# Patient Record
Sex: Female | Born: 1935
Health system: Southern US, Community
[De-identification: ages and names within clinical notes are randomized; demographics above are authoritative.]

## PROBLEM LIST (undated history)

## (undated) DIAGNOSIS — F32A Depression, unspecified: Secondary | ICD-10-CM

## (undated) DIAGNOSIS — I255 Ischemic cardiomyopathy: Secondary | ICD-10-CM

## (undated) DIAGNOSIS — I442 Atrioventricular block, complete: Secondary | ICD-10-CM

## (undated) DIAGNOSIS — E785 Hyperlipidemia, unspecified: Secondary | ICD-10-CM

## (undated) DIAGNOSIS — I2510 Atherosclerotic heart disease of native coronary artery without angina pectoris: Secondary | ICD-10-CM

## (undated) DIAGNOSIS — K219 Gastro-esophageal reflux disease without esophagitis: Secondary | ICD-10-CM

## (undated) DIAGNOSIS — D869 Sarcoidosis, unspecified: Secondary | ICD-10-CM

## (undated) DIAGNOSIS — E039 Hypothyroidism, unspecified: Secondary | ICD-10-CM

## (undated) DIAGNOSIS — F329 Major depressive disorder, single episode, unspecified: Secondary | ICD-10-CM

## (undated) DIAGNOSIS — H269 Unspecified cataract: Secondary | ICD-10-CM

## (undated) DIAGNOSIS — M1990 Unspecified osteoarthritis, unspecified site: Secondary | ICD-10-CM

## (undated) DIAGNOSIS — I5032 Chronic diastolic (congestive) heart failure: Secondary | ICD-10-CM

## (undated) HISTORY — DX: Depression, unspecified: F32.A

## (undated) HISTORY — DX: Hyperlipidemia, unspecified: E78.5

## (undated) HISTORY — PX: OTHER SURGICAL HISTORY: SHX169

## (undated) HISTORY — PX: GALLBLADDER SURGERY: SHX652

## (undated) HISTORY — PX: UPPER GASTROINTESTINAL ENDOSCOPY: SHX188

## (undated) HISTORY — DX: Major depressive disorder, single episode, unspecified: F32.9

## (undated) HISTORY — DX: Atherosclerotic heart disease of native coronary artery without angina pectoris: I25.10

## (undated) HISTORY — PX: LUMBAR LAMINECTOMY: SHX95

## (undated) HISTORY — DX: Unspecified cataract: H26.9

## (undated) HISTORY — DX: Sarcoidosis, unspecified: D86.9

## (undated) HISTORY — PX: COLONOSCOPY: SHX174

## (undated) HISTORY — DX: Gastro-esophageal reflux disease without esophagitis: K21.9

## (undated) HISTORY — PX: ANTERIOR CERVICAL DISCECTOMY: SHX1160

## (undated) HISTORY — DX: Atrioventricular block, complete: I44.2

## (undated) HISTORY — PX: PACEMAKER INSERTION: SHX728

## (undated) HISTORY — DX: Unspecified osteoarthritis, unspecified site: M19.90

## (undated) HISTORY — DX: Chronic diastolic (congestive) heart failure: I50.32

## (undated) HISTORY — DX: Hypothyroidism, unspecified: E03.9

---

## 1999-09-01 ENCOUNTER — Encounter: Payer: Self-pay | Admitting: Neurosurgery

## 1999-09-03 ENCOUNTER — Inpatient Hospital Stay (HOSPITAL_COMMUNITY): Admission: RE | Admit: 1999-09-03 | Discharge: 1999-09-09 | Payer: Self-pay | Admitting: Neurosurgery

## 1999-09-03 ENCOUNTER — Encounter: Payer: Self-pay | Admitting: Neurosurgery

## 1999-09-05 ENCOUNTER — Encounter: Payer: Self-pay | Admitting: Neurosurgery

## 1999-09-08 ENCOUNTER — Encounter: Payer: Self-pay | Admitting: *Deleted

## 1999-11-04 ENCOUNTER — Encounter: Admission: RE | Admit: 1999-11-04 | Discharge: 1999-11-04 | Payer: Self-pay | Admitting: Neurosurgery

## 1999-11-04 ENCOUNTER — Encounter: Payer: Self-pay | Admitting: Neurosurgery

## 2000-01-13 ENCOUNTER — Encounter: Payer: Self-pay | Admitting: Neurosurgery

## 2000-01-13 ENCOUNTER — Encounter: Admission: RE | Admit: 2000-01-13 | Discharge: 2000-01-13 | Payer: Self-pay | Admitting: Neurosurgery

## 2000-01-29 ENCOUNTER — Encounter: Admission: RE | Admit: 2000-01-29 | Discharge: 2000-01-29 | Payer: Self-pay | Admitting: Neurosurgery

## 2000-01-29 ENCOUNTER — Encounter: Payer: Self-pay | Admitting: Neurosurgery

## 2000-02-11 ENCOUNTER — Encounter: Payer: Self-pay | Admitting: Neurosurgery

## 2000-02-11 ENCOUNTER — Ambulatory Visit (HOSPITAL_COMMUNITY): Admission: RE | Admit: 2000-02-11 | Discharge: 2000-02-12 | Payer: Self-pay | Admitting: Neurosurgery

## 2000-02-29 ENCOUNTER — Ambulatory Visit (HOSPITAL_COMMUNITY): Admission: RE | Admit: 2000-02-29 | Discharge: 2000-02-29 | Payer: Self-pay | Admitting: Neurosurgery

## 2000-02-29 ENCOUNTER — Encounter: Payer: Self-pay | Admitting: Internal Medicine

## 2000-04-01 ENCOUNTER — Ambulatory Visit (HOSPITAL_COMMUNITY): Admission: RE | Admit: 2000-04-01 | Discharge: 2000-04-01 | Payer: Self-pay | Admitting: Neurosurgery

## 2000-04-01 ENCOUNTER — Encounter: Payer: Self-pay | Admitting: Internal Medicine

## 2000-04-26 ENCOUNTER — Ambulatory Visit (HOSPITAL_COMMUNITY): Admission: RE | Admit: 2000-04-26 | Discharge: 2000-04-26 | Payer: Self-pay | Admitting: Neurosurgery

## 2000-04-26 ENCOUNTER — Encounter: Payer: Self-pay | Admitting: Internal Medicine

## 2000-10-17 ENCOUNTER — Encounter: Payer: Self-pay | Admitting: Neurosurgery

## 2000-10-17 ENCOUNTER — Ambulatory Visit (HOSPITAL_COMMUNITY): Admission: RE | Admit: 2000-10-17 | Discharge: 2000-10-17 | Payer: Self-pay | Admitting: Neurosurgery

## 2000-12-07 ENCOUNTER — Encounter: Payer: Self-pay | Admitting: Neurosurgery

## 2000-12-09 ENCOUNTER — Encounter: Payer: Self-pay | Admitting: Neurosurgery

## 2000-12-09 ENCOUNTER — Inpatient Hospital Stay (HOSPITAL_COMMUNITY): Admission: RE | Admit: 2000-12-09 | Discharge: 2000-12-14 | Payer: Self-pay | Admitting: Neurosurgery

## 2003-03-27 ENCOUNTER — Ambulatory Visit (HOSPITAL_COMMUNITY): Admission: RE | Admit: 2003-03-27 | Discharge: 2003-03-27 | Payer: Self-pay | Admitting: Internal Medicine

## 2004-02-21 ENCOUNTER — Ambulatory Visit (HOSPITAL_COMMUNITY): Admission: RE | Admit: 2004-02-21 | Discharge: 2004-02-22 | Payer: Self-pay | Admitting: *Deleted

## 2004-11-11 ENCOUNTER — Ambulatory Visit: Payer: Self-pay | Admitting: Cardiology

## 2005-01-27 ENCOUNTER — Inpatient Hospital Stay (HOSPITAL_COMMUNITY): Admission: EM | Admit: 2005-01-27 | Discharge: 2005-01-28 | Payer: Self-pay | Admitting: Emergency Medicine

## 2005-01-27 ENCOUNTER — Ambulatory Visit: Payer: Self-pay | Admitting: Cardiology

## 2005-02-11 ENCOUNTER — Ambulatory Visit: Payer: Self-pay | Admitting: Cardiology

## 2005-02-16 ENCOUNTER — Ambulatory Visit: Payer: Self-pay | Admitting: Internal Medicine

## 2005-03-22 ENCOUNTER — Ambulatory Visit: Payer: Self-pay | Admitting: Internal Medicine

## 2005-04-16 ENCOUNTER — Ambulatory Visit: Payer: Self-pay | Admitting: Internal Medicine

## 2005-06-01 ENCOUNTER — Ambulatory Visit: Payer: Self-pay | Admitting: Internal Medicine

## 2005-07-13 ENCOUNTER — Ambulatory Visit: Payer: Self-pay | Admitting: Cardiology

## 2005-10-29 ENCOUNTER — Ambulatory Visit: Payer: Self-pay | Admitting: Cardiology

## 2005-11-05 ENCOUNTER — Ambulatory Visit: Payer: Self-pay | Admitting: *Deleted

## 2005-12-14 ENCOUNTER — Ambulatory Visit: Payer: Self-pay | Admitting: Internal Medicine

## 2006-01-28 ENCOUNTER — Ambulatory Visit: Payer: Self-pay | Admitting: Internal Medicine

## 2006-02-15 ENCOUNTER — Ambulatory Visit (HOSPITAL_COMMUNITY): Admission: RE | Admit: 2006-02-15 | Discharge: 2006-02-15 | Payer: Self-pay | Admitting: Internal Medicine

## 2006-03-04 ENCOUNTER — Ambulatory Visit: Payer: Self-pay | Admitting: Internal Medicine

## 2006-03-16 ENCOUNTER — Ambulatory Visit: Payer: Self-pay | Admitting: Internal Medicine

## 2006-03-16 ENCOUNTER — Ambulatory Visit (HOSPITAL_COMMUNITY): Admission: RE | Admit: 2006-03-16 | Discharge: 2006-03-16 | Payer: Self-pay | Admitting: Internal Medicine

## 2006-03-17 ENCOUNTER — Ambulatory Visit (HOSPITAL_COMMUNITY): Admission: RE | Admit: 2006-03-17 | Discharge: 2006-03-17 | Payer: Self-pay | Admitting: Internal Medicine

## 2006-04-19 ENCOUNTER — Ambulatory Visit: Payer: Self-pay | Admitting: Internal Medicine

## 2006-05-25 ENCOUNTER — Inpatient Hospital Stay (HOSPITAL_COMMUNITY): Admission: AD | Admit: 2006-05-25 | Discharge: 2006-05-27 | Payer: Self-pay | Admitting: Cardiology

## 2006-05-25 ENCOUNTER — Ambulatory Visit: Payer: Self-pay | Admitting: Internal Medicine

## 2006-06-27 ENCOUNTER — Ambulatory Visit: Payer: Self-pay | Admitting: Cardiology

## 2006-07-21 ENCOUNTER — Ambulatory Visit: Payer: Self-pay | Admitting: Internal Medicine

## 2006-08-23 ENCOUNTER — Ambulatory Visit: Payer: Self-pay | Admitting: Internal Medicine

## 2006-09-21 ENCOUNTER — Ambulatory Visit: Payer: Self-pay | Admitting: Internal Medicine

## 2006-10-19 ENCOUNTER — Ambulatory Visit: Payer: Self-pay | Admitting: Internal Medicine

## 2006-11-30 ENCOUNTER — Ambulatory Visit: Payer: Self-pay | Admitting: Internal Medicine

## 2007-01-11 ENCOUNTER — Ambulatory Visit: Payer: Self-pay | Admitting: Internal Medicine

## 2007-01-11 ENCOUNTER — Ambulatory Visit: Payer: Self-pay | Admitting: Cardiology

## 2007-01-12 ENCOUNTER — Ambulatory Visit: Payer: Self-pay | Admitting: Cardiology

## 2007-01-12 ENCOUNTER — Inpatient Hospital Stay (HOSPITAL_COMMUNITY): Admission: AD | Admit: 2007-01-12 | Discharge: 2007-01-14 | Payer: Self-pay | Admitting: Internal Medicine

## 2007-01-13 ENCOUNTER — Encounter: Payer: Self-pay | Admitting: Cardiology

## 2007-02-08 ENCOUNTER — Ambulatory Visit: Payer: Self-pay | Admitting: Internal Medicine

## 2007-03-12 ENCOUNTER — Ambulatory Visit: Payer: Self-pay | Admitting: Internal Medicine

## 2007-04-05 ENCOUNTER — Ambulatory Visit: Payer: Self-pay | Admitting: Internal Medicine

## 2007-05-03 ENCOUNTER — Ambulatory Visit: Payer: Self-pay | Admitting: Internal Medicine

## 2007-06-06 ENCOUNTER — Encounter: Payer: Self-pay | Admitting: Cardiology

## 2007-06-28 ENCOUNTER — Ambulatory Visit: Payer: Self-pay | Admitting: Internal Medicine

## 2007-07-14 ENCOUNTER — Ambulatory Visit: Payer: Self-pay | Admitting: Internal Medicine

## 2007-07-26 ENCOUNTER — Ambulatory Visit: Payer: Self-pay | Admitting: Internal Medicine

## 2007-08-09 ENCOUNTER — Ambulatory Visit: Payer: Self-pay | Admitting: Cardiology

## 2007-08-23 ENCOUNTER — Ambulatory Visit: Payer: Self-pay | Admitting: Internal Medicine

## 2007-08-24 ENCOUNTER — Ambulatory Visit: Payer: Self-pay | Admitting: Cardiology

## 2007-09-20 ENCOUNTER — Ambulatory Visit: Payer: Self-pay | Admitting: Internal Medicine

## 2007-11-15 ENCOUNTER — Ambulatory Visit: Payer: Self-pay | Admitting: Internal Medicine

## 2007-12-13 ENCOUNTER — Ambulatory Visit: Payer: Self-pay | Admitting: Internal Medicine

## 2007-12-19 ENCOUNTER — Ambulatory Visit: Payer: Self-pay | Admitting: Cardiology

## 2008-01-10 ENCOUNTER — Ambulatory Visit: Payer: Self-pay | Admitting: Internal Medicine

## 2008-01-26 ENCOUNTER — Ambulatory Visit: Payer: Self-pay | Admitting: Internal Medicine

## 2008-02-13 ENCOUNTER — Ambulatory Visit: Payer: Self-pay | Admitting: Cardiology

## 2008-02-19 ENCOUNTER — Encounter: Payer: Self-pay | Admitting: Cardiology

## 2008-03-15 ENCOUNTER — Ambulatory Visit: Payer: Self-pay

## 2008-03-21 ENCOUNTER — Ambulatory Visit: Payer: Self-pay | Admitting: Internal Medicine

## 2008-04-19 ENCOUNTER — Ambulatory Visit: Payer: Self-pay | Admitting: Internal Medicine

## 2008-05-28 ENCOUNTER — Ambulatory Visit: Payer: Self-pay | Admitting: Cardiology

## 2008-06-13 ENCOUNTER — Ambulatory Visit: Payer: Self-pay | Admitting: Internal Medicine

## 2008-06-19 ENCOUNTER — Encounter: Payer: Self-pay | Admitting: Internal Medicine

## 2008-06-25 ENCOUNTER — Ambulatory Visit: Payer: Self-pay | Admitting: Internal Medicine

## 2008-06-25 ENCOUNTER — Ambulatory Visit (HOSPITAL_COMMUNITY): Admission: RE | Admit: 2008-06-25 | Discharge: 2008-06-25 | Payer: Self-pay | Admitting: Internal Medicine

## 2008-07-12 ENCOUNTER — Ambulatory Visit: Payer: Self-pay | Admitting: Internal Medicine

## 2008-10-10 DIAGNOSIS — I2510 Atherosclerotic heart disease of native coronary artery without angina pectoris: Secondary | ICD-10-CM | POA: Insufficient documentation

## 2008-10-10 DIAGNOSIS — D869 Sarcoidosis, unspecified: Secondary | ICD-10-CM | POA: Insufficient documentation

## 2008-10-10 DIAGNOSIS — E785 Hyperlipidemia, unspecified: Secondary | ICD-10-CM | POA: Insufficient documentation

## 2008-10-10 DIAGNOSIS — I442 Atrioventricular block, complete: Secondary | ICD-10-CM | POA: Insufficient documentation

## 2008-10-10 DIAGNOSIS — I10 Essential (primary) hypertension: Secondary | ICD-10-CM | POA: Insufficient documentation

## 2008-10-23 ENCOUNTER — Encounter: Payer: Self-pay | Admitting: Cardiology

## 2008-12-12 ENCOUNTER — Ambulatory Visit: Payer: Self-pay | Admitting: Cardiology

## 2009-04-10 ENCOUNTER — Encounter: Payer: Self-pay | Admitting: Internal Medicine

## 2009-04-18 ENCOUNTER — Encounter: Payer: Self-pay | Admitting: Cardiology

## 2009-04-18 ENCOUNTER — Ambulatory Visit: Payer: Self-pay | Admitting: Internal Medicine

## 2009-06-18 ENCOUNTER — Ambulatory Visit: Payer: Self-pay | Admitting: Cardiology

## 2010-01-05 ENCOUNTER — Encounter: Payer: Self-pay | Admitting: Cardiology

## 2010-02-05 ENCOUNTER — Encounter: Payer: Self-pay | Admitting: Cardiology

## 2010-02-05 ENCOUNTER — Encounter: Payer: Self-pay | Admitting: Physician Assistant

## 2010-02-05 ENCOUNTER — Ambulatory Visit: Payer: Self-pay | Admitting: Cardiology

## 2010-02-06 ENCOUNTER — Encounter: Payer: Self-pay | Admitting: Cardiology

## 2010-03-02 ENCOUNTER — Ambulatory Visit: Payer: Self-pay | Admitting: Cardiology

## 2010-04-03 ENCOUNTER — Ambulatory Visit: Payer: Self-pay | Admitting: Internal Medicine

## 2010-05-05 ENCOUNTER — Ambulatory Visit (HOSPITAL_COMMUNITY): Admission: RE | Admit: 2010-05-05 | Discharge: 2010-05-05 | Payer: Self-pay | Admitting: Ophthalmology

## 2010-06-09 ENCOUNTER — Ambulatory Visit (HOSPITAL_COMMUNITY): Admission: RE | Admit: 2010-06-09 | Discharge: 2010-06-09 | Payer: Self-pay | Admitting: Ophthalmology

## 2010-09-04 ENCOUNTER — Encounter: Payer: Self-pay | Admitting: Cardiology

## 2010-10-27 ENCOUNTER — Ambulatory Visit: Payer: Self-pay | Admitting: Cardiology

## 2010-12-03 ENCOUNTER — Inpatient Hospital Stay (HOSPITAL_COMMUNITY)
Admission: EM | Admit: 2010-12-03 | Discharge: 2010-12-04 | Payer: Self-pay | Source: Home / Self Care | Attending: Internal Medicine | Admitting: Internal Medicine

## 2010-12-03 ENCOUNTER — Encounter: Payer: Self-pay | Admitting: Cardiology

## 2010-12-04 ENCOUNTER — Encounter: Payer: Self-pay | Admitting: Cardiology

## 2011-01-16 ENCOUNTER — Encounter: Payer: Self-pay | Admitting: Internal Medicine

## 2011-01-21 ENCOUNTER — Ambulatory Visit
Admission: RE | Admit: 2011-01-21 | Discharge: 2011-01-21 | Payer: Self-pay | Source: Home / Self Care | Attending: Cardiology | Admitting: Cardiology

## 2011-01-26 NOTE — Assessment & Plan Note (Signed)
SummaryChauncy Lean [facility] 2/11   Visit Type:  Follow-up Primary Provider:  Dr. Donzetta Sprung   History of Present Illness: 77 year old woman presents for a followup visit. She was seen in consultation during an admission to [facility] back in February with atypical chest pain. An echocardiogram was obtained after the patient ruled out for myocardial infarction, results detail below. She reports doing reasonably well since that time without any progressive chest pain.  Ms. Hungate asked today, "so what is wrong with my heart - nobody has ever told me?"  As I have explained to her on many occasions, I reviewed her history of obstructive coronary artery disease status post percutaneous intervention, as well as complete heart block status post pacemaker placement by Dr. Graciela Husbands. I also reviewed the importance of her medications and regular followup, and the fact that her most recent objective testing has suggested stability.  In reviewing the discharge summary from February, it is not clear that Ms. Joana Reamer has been taking either Norvasc or hydrochlorothiazide as indicated.  Current Medications (verified): 1)  Aspir-Low 81 Mg Tbec (Aspirin) .... Take 1 Tablet By Mouth Once A Day 2)  Simvastatin 40 Mg Tabs (Simvastatin) .... Take 1 Tablet By Mouth Once A Day 3)  Prevacid 30 Mg Cpdr (Lansoprazole) .... Take 1 Tablet By Mouth Once A Day 4)  Caltrate 600+d 600-400 Mg-Unit Tabs (Calcium Carbonate-Vitamin D) .... Take 1 Tablet By Mouth Once A Day 5)  Hydrochlorothiazide 25 Mg Tabs (Hydrochlorothiazide) .... Take 1 Tablet By Mouth Once A Day 6)  Synthroid 88 Mcg Tabs (Levothyroxine Sodium) .... Take 1 Tablet By Mouth Once A Day 7)  Zoloft 100 Mg Tabs (Sertraline Hcl) .... Take 1/2 Tablet By Mouth Once A Day  Allergies (verified): No Known Drug Allergies  Comments:  Nurse/Medical Assistant: Spoke with pt's pharmacy who states the last time Norvasc was filled was April of 2010 and HCTZ  was last filled in October of 2010.  Cyril Loosen, RN, BSN (March 02, 2010 10:54 AM)  Past History:  Past Medical History: Last updated: 02/27/2010 Complete Heart Block s/p Medtronic PPM Sarcoidosis Diastolic CHF CAD - DES LAD 2/05, normal LVEF Arthritis Depression Hyperlipidemia Hypothyroidism  Social History: Last updated: 02/27/2010 Tobacco Use - No Alcohol Use - no  Clinical Review Panels:  Echocardiogram Echocardiogram Normal left ventricular chamber size with mild LVH, and LVEF of 60%. Device wire present in right ventricle. Mild mitral regurgitation. Moderate aortic calcification with slight aortic stenosis. RVSP 39 mm mercury. (02/06/2010)  Cardiac Imaging Cardiac Cath Findings ANGIOGRAPHIC DATA:  1. The left main appeared to be free of critical disease.  2. The left anterior descending artery courses to the apex.  In the      mid LAD is a previously placed stent.  There is about 30% narrowing      at the takeoff of the first diagonal.  The stent itself appears to      be widely patent, there does not appear to be significant narrowing      of the stent.  It does overlap a small diagonal which may have some      very mild ostial pinching.  The remainder of the diagonals may have      mild luminal irregularity but no significant focal obstruction.  3. The circumflex is a large-caliber vessel.  There is a tiny first      marginal that has about 70% ostial narrowing; second, third and  fourth marginals are all free of critical disease.  4. The right coronary artery is a moderate-sized vessel.  There is      some calcification proximally.  It provides a single PDA and a      smaller posterolateral system both of which appear free of critical      disease. (01/13/2007)    Review of Systems  The patient denies anorexia, fever, weight loss, chest pain, syncope, peripheral edema, headaches, melena, and hematochezia.         Occasional indigestion, otherwise  reviewed and negative.  Vital Signs:  Patient profile:   77 year old female Height:      65 inches Weight:      166 pounds BMI:     27.72 Pulse rate:   71 / minute BP sitting:   144 / 82  (left arm) Cuff size:   regular  Vitals Entered By: Carlye Grippe (March 02, 2010 10:21 AM)  Nutrition Counseling: Patient's BMI is greater than 25 and therefore counseled on weight management options.   Physical Exam  Additional Exam:  Normally nourished appearing woman in no acute distress. HEENT: Conjunctiva and lids normal, oropharynx moist mucosa. Neck: Supple, no carotid bruits, no thyromegaly. Lungs: Clear to auscultation, nonlabored. Cardiac: Regular rate and rhythm. Thorax: Stable device pocket site. Extremities: No pitting edema.   PPM Specifications Following MD:  Sherryl Manges, MD     PPM Vendor:  [facility] [employer]     PPM Model Number:  [id number]     PPM Serial Number:  [id number] PPM DOI:  06/25/2008     PPM Implanting MD:  Sherryl Manges, MD  Lead 1    Location: RA     DOI: 09/04/1999     Model #: [id number]     Serial #: [id number]     Status: active Lead 2    Location: RV     DOI: 09/04/1999     Model #: [id number]     Serial #: [id number]     Status: active  Magnet Response Rate:  BOL 100 ERI 85  Indications:  CHB  Explantation Comments:  06/25/2008 Meridan [id number] explanted  PPM Follow Up Pacer Dependent:  Yes      Parameters Mode:  DDDR     Lower Rate Limit:  60     Upper Rate Limit:  130 Paced AV Delay:  150     Sensed AV Delay:  120  Impression & Recommendations:  Problem # 1:  CAD, NATIVE VESSEL (ICD-414.01)  Stable overall. Plan to continue medical therapy, and arrange followup visit in 6 months. Recent echocardiography reveals preserved left ventricular systolic function.  The following medications were removed from the medication list:    Norvasc 10 Mg Tabs (Amlodipine besylate) .Marland Kitchen... Take 1 tablet by mouth once a day Her updated medication list for this problem  includes:    Aspir-low 81 Mg Tbec (Aspirin) .Marland Kitchen... Take 1 tablet by mouth once a day    Amlodipine Besylate 10 Mg Tabs (Amlodipine besylate) .Marland Kitchen... Take one tablet by mouth daily  Problem # 2:  HYPERTENSION, BENIGN (ICD-401.1)  Will verify medication list. Based on recent discharge summary, patient should be on Norvasc and hydrochlorothiazide for blood pressure control.  The following medications were removed from the medication list:    Norvasc 10 Mg Tabs (Amlodipine besylate) .Marland Kitchen... Take 1 tablet by mouth once a day Her updated medication list for this problem includes:    Aspir-low 81 Mg Tbec (Aspirin) .Marland KitchenMarland KitchenMarland KitchenMarland Kitchen  Take 1 tablet by mouth once a day    Hydrochlorothiazide 12.5 Mg Tabs (Hydrochlorothiazide) .Marland Kitchen... Take one tablet by mouth daily.    Amlodipine Besylate 10 Mg Tabs (Amlodipine besylate) .Marland Kitchen... Take one tablet by mouth daily  Patient Instructions: 1)  Start taking Norvasc (amlodipine) 10mg  by mouth once daily. 2)  Start taking HCTZ (hydrochlorothiazide) 12.5mg  by mouth once daily. 3)  Your physician wants you to follow-up in: 6 months. You will receive a reminder letter in the mail one-two months in advance. If you don't receive a letter, please call our office to schedule the follow-up appointment. Prescriptions: AMLODIPINE BESYLATE 10 MG TABS (AMLODIPINE BESYLATE) Take one tablet by mouth daily  #30 x 6   Entered by:   Cyril Loosen, RN, BSN   Authorized by:   Loreli Slot, MD, [facility]   Signed by:   Cyril Loosen, RN, BSN on 03/02/2010   Method used:   Electronically to        [facility] [address] (retail)       [address]       [place], [place]  [address]       Ph: [phone] or [phone]       Fax: [phone]   RxID:   [id number] HYDROCHLOROTHIAZIDE 12.5 MG TABS (HYDROCHLOROTHIAZIDE) Take one tablet by mouth daily.  #30 x 6   Entered by:   Cyril Loosen, RN, BSN   Authorized by:   Loreli Slot, MD, [facility]    Signed by:   Cyril Loosen, RN, BSN on 03/02/2010   Method used:   Electronically to        [facility] [address] (retail)       [address]       [place], [place]  [address]       Ph: [phone] or [phone]       Fax: [phone]   RxID:   [id number]

## 2011-01-26 NOTE — Letter (Signed)
Summary: [facility] D/C  DR. TERY DANIEL  [facility] D/C  DR. TERY DANIEL   Imported By: Zachary George 03/02/2010 08:38:24  _____________________________________________________________________  External Attachment:    Type:   Image     Comment:   External Document

## 2011-01-26 NOTE — Letter (Signed)
Summary: External Correspondence/ NOTE DR. DANIEL  External Correspondence/ NOTE DR. DANIEL   Imported By: Dorise Hiss 01/08/2010 08:30:39  _____________________________________________________________________  External Attachment:    Type:   Image     Comment:   External Document

## 2011-01-26 NOTE — Consult Note (Signed)
Summary: CARDIOLOGY CONSULT/ [facility]  CARDIOLOGY CONSULT/ [facility]   Imported By: Zachary George 03/02/2010 08:37:19  _____________________________________________________________________  External Attachment:    Type:   Image     Comment:   External Document

## 2011-01-26 NOTE — Cardiovascular Report (Signed)
Summary: Office Visit   Office Visit   Imported By: Roderic Ovens 04/16/2010 14:01:24  _____________________________________________________________________  External Attachment:    Type:   Image     Comment:   External Document

## 2011-01-26 NOTE — Procedures (Signed)
Summary: PC2   Current Medications (verified): 1)  Aspir-Low 81 Mg Tbec (Aspirin) .... Take 1 Tablet By Mouth Once A Day 2)  Simvastatin 40 Mg Tabs (Simvastatin) .... Take 1 Tablet By Mouth Once A Day 3)  Prevacid 30 Mg Cpdr (Lansoprazole) .... Take 1 Tablet By Mouth Once A Day 4)  Caltrate 600+d 600-400 Mg-Unit Tabs (Calcium Carbonate-Vitamin D) .... Take 1 Tablet By Mouth Once A Day 5)  Hydrochlorothiazide 12.5 Mg Tabs (Hydrochlorothiazide) .... Take One Tablet By Mouth Daily. 6)  Synthroid 88 Mcg Tabs (Levothyroxine Sodium) .... Take 1 Tablet By Mouth Once A Day 7)  Zoloft 100 Mg Tabs (Sertraline Hcl) .... Take 1/2 Tablet By Mouth Once A Day 8)  Amlodipine Besylate 10 Mg Tabs (Amlodipine Besylate) .... Take One Tablet By Mouth Daily  Allergies (verified): No Known Drug Allergies   PPM Specifications Following MD:  Hillis Range, MD     PPM Vendor:  Medtronic     PPM Model Number:  [id number]     PPM Serial Number:  [id number] PPM DOI:  06/25/2008     PPM Implanting MD:  Sherryl Manges, MD  Lead 1    Location: RA     DOI: 09/04/1999     Model #: [id number]     Serial #: [id number]     Status: active Lead 2    Location: RV     DOI: 09/04/1999     Model #: [id number]     Serial #: [id number]     Status: active  Magnet Response Rate:  BOL 100 ERI 85  Indications:  CHB  Explantation Comments:  06/25/2008 Meridan [id number] explanted  PPM Follow Up Remote Check?  No Battery Voltage:  2.81 V     Battery Est. Longevity:  7.5 YEARS     Pacer Dependent:  Yes       PPM Device Measurements Atrium  Amplitude: 4 mV, Impedance: 452 ohms, Threshold: 0.625 V at 0.4 msec Right Ventricle  Amplitude: 11.2 mV, Threshold: 1.125 V at 0.4 msec  Episodes MS Episodes:  11     Percent Mode Switch:  <0.1%     Ventricular High Rate:  0     Atrial Pacing:  9.8%     Ventricular Pacing:  99.7%  Parameters Mode:  DDDR     Lower Rate Limit:  60     Upper Rate Limit:  130 Paced AV Delay:  150     Sensed AV Delay:  120 Next  Cardiology Appt Due:  09/26/2010 Tech Comments:  Normal device function.  Atrial sensitivity changed to unipolar today, bipolar P waves were 0.7, unipolar, 4.  All mode switch episodes less than 1 minute. No other changes made today.  ROV 6 months Dr Johney Frame. Gypsy Balsam RN BSN  April 03, 2010 10:11 AM

## 2011-01-26 NOTE — Assessment & Plan Note (Signed)
Summary: 6 mon ful fholt   Visit Type:  Follow-up Primary Provider:  Dr. Donzetta Sprung   History of Present Illness: 77 year old woman presents for followup. I saw her back in March. She was seen for device followup by Dr. Johney Frame in April, with normal device function.  She was seen in the morning at ED back in September with weakness, chest pain, dizziness, diarrhea. Lab work showed a BUN 21, creatinine 1.7, sodium 137, potassium 2.6, TSH 0.61, CK 69, troponin I 0.02, d-dimer 1.2, that he sees 8.7, 1115.9, platelets 229. Ventilation perfusion lung scan was very low probability for pulmonary embolus. Potassium was supplemented and she received IV fluids. She declined admission. These symptoms resolved - she thinks that she had "a virus."  No chest pain or unusual dyspnea. No palpitations.  Clinical Review Panels:  Echocardiogram Echocardiogram Normal left ventricular chamber size with mild LVH, and LVEF of 60%. Device wire present in right ventricle. Mild mitral regurgitation. Moderate aortic calcification with slight aortic stenosis. RVSP 39 mm mercury. (02/06/2010)  Cardiac Imaging Cardiac Cath Findings ANGIOGRAPHIC DATA:  1. The left main appeared to be free of critical disease.  2. The left anterior descending artery courses to the apex.  In the      mid LAD is a previously placed stent.  There is about 30% narrowing      at the takeoff of the first diagonal.  The stent itself appears to      be widely patent, there does not appear to be significant narrowing      of the stent.  It does overlap a small diagonal which may have some      very mild ostial pinching.  The remainder of the diagonals may have      mild luminal irregularity but no significant focal obstruction.  3. The circumflex is a large-caliber vessel.  There is a tiny first      marginal that has about 70% ostial narrowing; second, third and      fourth marginals are all free of critical disease.  4. The right coronary  artery is a moderate-sized vessel.  There is      some calcification proximally.  It provides a single PDA and a      smaller posterolateral system both of which appear free of critical      disease. (01/13/2007)    Preventive Screening-Counseling & Management  Alcohol-Tobacco     Smoking Status: never  Current Medications (verified): 1)  Aspir-Low 81 Mg Tbec (Aspirin) .... Take 1 Tablet By Mouth Once A Day 2)  Simvastatin 40 Mg Tabs (Simvastatin) .... Take 1 Tablet By Mouth Once A Day 3)  Prevacid 30 Mg Cpdr (Lansoprazole) .... Take 1 Tablet By Mouth Once A Day 4)  Caltrate 600+d 600-400 Mg-Unit Tabs (Calcium Carbonate-Vitamin D) .... Take 1 Tablet By Mouth Once A Day 5)  Hydrochlorothiazide 12.5 Mg Tabs (Hydrochlorothiazide) .... Take One Tablet By Mouth Daily. 6)  Synthroid 88 Mcg Tabs (Levothyroxine Sodium) .... Take 1 Tablet By Mouth Once A Day 7)  Zoloft 100 Mg Tabs (Sertraline Hcl) .... Take 1/2 Tablet By Mouth Once A Day 8)  Amlodipine Besylate 10 Mg Tabs (Amlodipine Besylate) .... Take One Tablet By Mouth Daily 9)  Bromday 0.09 % Soln (Bromfenac Sodium) .... One Drop Ou Two Times A Day 10)  Durezol 0.05 % Emul (Difluprednate) .... One Drop Ou Two Times A Day 11)  Vigamox 0.5 % Soln (Moxifloxacin Hcl) .... One Drop [employer]  Two Times A Day  Allergies (verified): No Known Drug Allergies  Comments:  Nurse/Medical Assistant: The patient's medication list and allergies were reviewed with the patient and were updated in the Medication and Allergy Lists.  Past History:  Past Medical History: Last updated: 02/27/2010 Complete Heart Block s/p Medtronic PPM Sarcoidosis Diastolic CHF CAD - DES LAD 2/05, normal LVEF Arthritis Depression Hyperlipidemia Hypothyroidism  Social History: Last updated: 02/27/2010 Tobacco Use - No Alcohol Use - no  Review of Systems  The patient denies anorexia, fever, weight loss, chest pain, syncope, dyspnea on exertion, peripheral edema,  prolonged cough, hemoptysis, melena, and hematochezia.         Otherwise reviewed and negative.  Vital Signs:  Patient profile:   77 year old female Height:      65 inches Weight:      167 pounds O2 Sat:      95 % on Room air Pulse rate:   69 / minute BP sitting:   123 / 77  (left arm) Cuff size:   large  Vitals Entered By: Carlye Grippe (October 27, 2010 3:18 PM)  O2 Flow:  Room air  Physical Exam  Additional Exam:  Normally nourished appearing woman in no acute distress. HEENT: Conjunctiva and lids normal, oropharynx moist mucosa. Neck: Supple, no carotid bruits, no thyromegaly. Lungs: Clear to auscultation, nonlabored. Cardiac: Regular rate and rhythm. Thorax: Stable device pocket site. Extremities: No pitting edema.   PPM Specifications Following MD:  Hillis Range, MD     PPM Vendor:  Medtronic     PPM Model Number:  [id number]     PPM Serial Number:  [id number] PPM DOI:  06/25/2008     PPM Implanting MD:  Sherryl Manges, MD  Lead 1    Location: RA     DOI: 09/04/1999     Model #: [id number]     Serial #: [id number]     Status: active Lead 2    Location: RV     DOI: 09/04/1999     Model #: [id number]     Serial #: [id number]     Status: active  Magnet Response Rate:  BOL 100 ERI 85  Indications:  CHB  Explantation Comments:  06/25/2008 Meridan [id number]/[id number] explanted  PPM Follow Up Pacer Dependent:  Yes      Parameters Mode:  DDDR     Lower Rate Limit:  60     Upper Rate Limit:  130 Paced AV Delay:  150     Sensed AV Delay:  120  Impression & Recommendations:  Problem # 1:  CAD, NATIVE VESSEL (ICD-414.01)  Symptomatically stable without significant angina on medical therapy. No changes made today. Follow-up in 6 months.  Her updated medication list for this problem includes:    Aspir-low 81 Mg Tbec (Aspirin) .Marland Kitchen... Take 1 tablet by mouth once a day    Amlodipine Besylate 10 Mg Tabs (Amlodipine besylate) .Marland Kitchen... Take one tablet by mouth daily  Problem # 2:  AV BLOCK, COMPLETE  (ICD-426.0)  Status post pacemaker, followed by Dr. Johney Frame.  Her updated medication list for this problem includes:    Aspir-low 81 Mg Tbec (Aspirin) .Marland Kitchen... Take 1 tablet by mouth once a day    Amlodipine Besylate 10 Mg Tabs (Amlodipine besylate) .Marland Kitchen... Take one tablet by mouth daily  Patient Instructions: 1)  Your physician recommends that you continue on your current medications as directed. Please refer to the Current Medication list given to you today. 2)  Follow up in  6 months

## 2011-01-28 NOTE — Assessment & Plan Note (Signed)
Summary: EPH-6 [facility] POST [facility]   Visit Type:  Follow-up Primary Provider:  Dr. Donzetta Sprung   History of Present Illness: 77 year old woman presents for followup. She was seen back in November 2011. Record review finds that she was admitted to [facility] in December 2011 with chest pain. She ruled out for myocardial infarction and underwent a Myoview study, reviewed below, without ischemia. Medication adjustments were made.  She presents today stating that she has not had any further chest pain. She reports some memory problems and occasional confusion, discussing this with Dr. Reuel Boom. She has stopped her Crestor completely, complaining of leg discomfort on the medication, subsequently resolved. Medical regimen is reviewed below.  Blood pressure looks good today. We discussed a basic walking regimen. She continues to have device followup with Dr. Johney Frame.  Preventive Screening-Counseling & Management  Alcohol-Tobacco     Smoking Status: never  Current Medications (verified): 1)  Aspir-Low 81 Mg Tbec (Aspirin) .... Take 1 Tablet By Mouth Once A Day 2)  Prevacid 30 Mg Cpdr (Lansoprazole) .... Take 1 Tablet By Mouth Once A Day 3)  Caltrate 600+d 600-400 Mg-Unit Tabs (Calcium Carbonate-Vitamin D) .... Take 1 Tablet By Mouth Once A Day 4)  Hydrochlorothiazide 12.5 Mg Tabs (Hydrochlorothiazide) .... Take One Tablet By Mouth Daily. 5)  Synthroid 88 Mcg Tabs (Levothyroxine Sodium) .... Take 1 Tablet By Mouth Once A Day 6)  Zoloft 100 Mg Tabs (Sertraline Hcl) .... Take 1/2 Tablet By Mouth Once A Day 7)  Amlodipine Besylate 10 Mg Tabs (Amlodipine Besylate) .... Take One Tablet By Mouth Daily As Needed 8)  Fish Oil 1000 Mg Caps (Omega-3 Fatty Acids) .... Take 1 Tablet By Mouth Once A Day 9)  Flax Seed Oil 1000 Mg Caps (Flaxseed (Linseed)) .... Take 1 Tablet By Mouth Once A Day 10)  Refresh 1.4-0.6 % Soln (Polyvinyl Alcohol-Povidone) .... As Needed  Allergies (verified): No Known Drug  Allergies  Comments:  Nurse/Medical Assistant: The patient's medication bottles and allergies were reviewed with the patient and were updated in the Medication and Allergy Lists.  Past History:  Past Medical History: Last updated: 02/27/2010 Complete Heart Block s/p Medtronic PPM Sarcoidosis Diastolic CHF CAD - DES LAD 2/05, normal LVEF Arthritis Depression Hyperlipidemia Hypothyroidism  Social History: Last updated: 02/27/2010 Tobacco Use - No Alcohol Use - no  Review of Systems       The patient complains of dyspnea on exertion.  The patient denies anorexia, fever, weight gain, chest pain, syncope, peripheral edema, hemoptysis, melena, and hematochezia.         Otherwise reviewed and negative.  Vital Signs:  Patient profile:   77 year old female Height:      65 inches Weight:      163 pounds BMI:     27.22 Pulse rate:   76 / minute BP sitting:   124 / 73  (left arm) Cuff size:   large  Vitals Entered By: Carlye Grippe (January 21, 2011 9:56 AM)  Nutrition Counseling: Patient's BMI is greater than 25 and therefore counseled on weight management options.  Physical Exam  Additional Exam:  Normally nourished appearing woman in no acute distress. HEENT: Conjunctiva and lids normal, oropharynx moist mucosa. Neck: Supple, no carotid bruits, no thyromegaly. Lungs: Clear to auscultation, nonlabored. Cardiac: Regular rate and rhythm. Thorax: Stable device pocket site. Extremities: No pitting edema. Skin: Warm and dry. Musculoskeletal: No kyphosis. Neuropsychiatric: Alert and oriented x3, affect grossly appropriate.   Nuclear Study  Procedure date:  12/04/2010  Findings:      Lexiscan Myoview demonstrating no perfusion evidence of ischemia with diaphragmatic attenuation, normal wall motion, and LVEF 82%.  PPM Specifications Following MD:  Hillis Range, MD     PPM Vendor:  Medtronic     PPM Model Number:  [id number]     PPM Serial Number:  [id number] PPM DOI:   06/25/2008     PPM Implanting MD:  Sherryl Manges, MD  Lead 1    Location: RA     DOI: 09/04/1999     Model #: [id number]     Serial #: [id number]     Status: active Lead 2    Location: RV     DOI: 09/04/1999     Model #: [id number]     Serial #: [id number]     Status: active  Magnet Response Rate:  BOL 100 ERI 85  Indications:  CHB  Explantation Comments:  06/25/2008 Meridan [id number] explanted  PPM Follow Up Pacer Dependent:  Yes      Parameters Mode:  DDDR     Lower Rate Limit:  60     Upper Rate Limit:  130 Paced AV Delay:  150     Sensed AV Delay:  120  Impression & Recommendations:  Problem # 1:  CAD, NATIVE VESSEL (ICD-414.01)  Symptomatically stable on medical therapy. Recent Myoview in December 2011 showed no evidence of ischemia. No changes made today. Followup in 6 months, sooner if needed.  Her updated medication list for this problem includes:    Aspir-low 81 Mg Tbec (Aspirin) .Marland Kitchen... Take 1 tablet by mouth once a day    Amlodipine Besylate 10 Mg Tabs (Amlodipine besylate) .Marland Kitchen... Take one tablet by mouth daily as needed  Problem # 2:  HYPERLIPIDEMIA-MIXED (ICD-272.4)  Patient was switched from simvastatin to Crestor back in December given concurrent use of Norvasc. She has however discontinued Crestor completely complaining of leg discomfort. She feels better now. She prefers to stay on omega-3 supplements and flaxseed oil at this point. We can readdress statin therapy along the way with a followup lipid profile.  The following medications were removed from the medication list:    Simvastatin 40 Mg Tabs (Simvastatin) .Marland Kitchen... Take 1 tablet by mouth once a day  Problem # 3:  HYPERTENSION, BENIGN (ICD-401.1)  Blood pressure well-controlled today.  Her updated medication list for this problem includes:    Aspir-low 81 Mg Tbec (Aspirin) .Marland Kitchen... Take 1 tablet by mouth once a day    Hydrochlorothiazide 12.5 Mg Tabs (Hydrochlorothiazide) .Marland Kitchen... Take one tablet by mouth daily.    Amlodipine  Besylate 10 Mg Tabs (Amlodipine besylate) .Marland Kitchen... Take one tablet by mouth daily as needed  Problem # 4:  AV BLOCK, COMPLETE (ICD-426.0)  Continue pacemaker followup with Dr. Johney Frame.  Her updated medication list for this problem includes:    Aspir-low 81 Mg Tbec (Aspirin) .Marland Kitchen... Take 1 tablet by mouth once a day    Amlodipine Besylate 10 Mg Tabs (Amlodipine besylate) .Marland Kitchen... Take one tablet by mouth daily as needed  Patient Instructions: 1)  Your physician wants you to follow-up in: 6 months. You will receive a reminder letter in the mail one-two months in advance. If you don't receive a letter, please call our office to schedule the follow-up appointment. 2)  Your physician recommends that you continue on your current medications as directed. Please refer to the Current Medication list given to you today. 3)  Your physician recommends that you go to the  [facility] for a FASTING lipid profile and liver function labs:  DO IN 6 MONTHS BEFORE OFFICE VISIT.

## 2011-01-28 NOTE — Cardiovascular Report (Signed)
Summary: Cardiac Catheterization  Cardiac Catheterization   Imported By: Dorise Hiss 01/21/2011 08:24:31  _____________________________________________________________________  External Attachment:    Type:   Image     Comment:   External Document

## 2011-02-15 ENCOUNTER — Encounter (INDEPENDENT_AMBULATORY_CARE_PROVIDER_SITE_OTHER): Payer: Medicare Other | Admitting: Internal Medicine

## 2011-02-15 ENCOUNTER — Encounter: Payer: Self-pay | Admitting: Internal Medicine

## 2011-02-15 DIAGNOSIS — I442 Atrioventricular block, complete: Secondary | ICD-10-CM

## 2011-02-23 NOTE — Cardiovascular Report (Signed)
Summary: Card Device Clinic/ FINAL REPORT  Card Device Clinic/ FINAL REPORT   Imported By: Dorise Hiss 02/16/2011 16:07:08  _____________________________________________________________________  External Attachment:    Type:   Image     Comment:   External Document

## 2011-02-23 NOTE — Assessment & Plan Note (Signed)
Summary: PER AMBER OVER DUE/NR   Primary Provider:  Dr. Donzetta Sprung   History of Present Illness: The patient presents today for routine electrophysiology followup. She reports doing reasonably well since last being seen in our clinic. She has occasional sharp fleeting chest pains which are stable.  She has chronic but stable dyspnea which she attributes to sarcoidosis. The patient denies symptoms of palpitations,  lower extremity edema, dizziness, presyncope, syncope, or neurologic sequela. The patient is tolerating medications without difficulties and is otherwise without complaint today.   Allergies: No Known Drug Allergies  Past History:  Past Medical History: Reviewed history from 02/27/2010 and no changes required. Complete Heart Block s/p Medtronic PPM Sarcoidosis Diastolic CHF CAD - DES LAD 2/05, normal LVEF Arthritis Depression Hyperlipidemia Hypothyroidism  Past Surgical History: Anterior cervical diskectomy with cervical fusion Lumbar laminectomy, L3-S1 S/P PPM for complete heart block  Social History: Reviewed history from 02/27/2010 and no changes required. Tobacco Use - No Alcohol Use - no  Vital Signs:  Patient profile:   77 year old female Height:      65 inches Weight:      163 pounds BMI:     27.22 Pulse rate:   85 / minute Resp:     16 per minute BP sitting:   126 / 70  (right arm)  Vitals Entered By: Marrion Coy, CNA (February 15, 2011 12:17 PM)  Physical Exam  General:  Well developed, well nourished, in no acute distress. Head:  normocephalic and atraumatic Eyes:  PERRLA/EOM intact; conjunctiva and lids normal. Mouth:  Teeth, gums and palate normal. Oral mucosa normal. Neck:  supple Chest Wall:  pacemaker pocket is well healed Lungs:  Clear bilaterally to auscultation and percussion. Heart:  RRR, no m/r/g Abdomen:  Bowel sounds positive; abdomen soft and non-tender without masses, organomegaly, or hernias noted. No  hepatosplenomegaly. Msk:  Back normal, normal gait. Muscle strength and tone normal. Extremities:  No clubbing or cyanosis. Neurologic:  Alert and oriented x 3. Skin:  Intact without lesions or rashes. Psych:  Normal affect.   PPM Specifications Following MD:  Hillis Range, MD     PPM Vendor:  Medtronic     PPM Model Number:  [id number]     PPM Serial Number:  [id number] PPM DOI:  06/25/2008     PPM Implanting MD:  Sherryl Manges, MD  Lead 1    Location: RA     DOI: 09/04/1999     Model #: [id number]     Serial #: [id number]     Status: active Lead 2    Location: RV     DOI: 09/04/1999     Model #: [id number]     Serial #: [id number]     Status: active  Magnet Response Rate:  BOL 100 ERI 85  Indications:  CHB  Explantation Comments:  06/25/2008 Meridan [id number]/[id number] explanted  PPM Follow Up Pacer Dependent:  Yes      Parameters Mode:  DDDR     Lower Rate Limit:  60     Upper Rate Limit:  130 Paced AV Delay:  150     Sensed AV Delay:  120 MD Comments:  see scanned report  Impression & Recommendations:  Problem # 1:  AV BLOCK, COMPLETE (ICD-426.0) normal pacemaker function see scanned report no changes  Problem # 2:  HYPERTENSION, BENIGN (ICD-401.1) at goal no changes  Patient Instructions: 1)  return to device clinic in 6 months

## 2011-03-09 LAB — COMPREHENSIVE METABOLIC PANEL
BUN: 14 mg/dL (ref 6–23)
CO2: 26 mEq/L (ref 19–32)
Calcium: 9.2 mg/dL (ref 8.4–10.5)
Chloride: 106 mEq/L (ref 96–112)
Creatinine, Ser: 1.55 mg/dL — ABNORMAL HIGH (ref 0.4–1.2)
GFR calc non Af Amer: 32 mL/min — ABNORMAL LOW (ref >60)
Glucose, Bld: 113 mg/dL — ABNORMAL HIGH (ref 70–99)
Total Bilirubin: 0.5 mg/dL (ref 0.3–1.2)

## 2011-03-09 LAB — DIFFERENTIAL
Basophils Absolute: 0 10*3/uL (ref 0.0–0.1)
Eosinophils Relative: 2 % (ref 0–5)
Lymphocytes Relative: 11 % — ABNORMAL LOW (ref 12–46)
Lymphs Abs: 0.8 10*3/uL (ref 0.7–4.0)
Neutrophils Relative %: 81 % — ABNORMAL HIGH (ref 43–77)

## 2011-03-09 LAB — LIPID PANEL
HDL: 41 mg/dL (ref >39)
Total CHOL/HDL Ratio: 5.3 RATIO
Triglycerides: 200 mg/dL — ABNORMAL HIGH (ref <150)
VLDL: 40 mg/dL (ref 0–40)

## 2011-03-09 LAB — CBC
HCT: 39.8 % (ref 36.0–46.0)
Hemoglobin: 13.5 g/dL (ref 12.0–15.0)
MCH: 30.5 pg (ref 26.0–34.0)
MCHC: 33.9 g/dL (ref 30.0–36.0)
MCV: 89.8 fL (ref 78.0–100.0)
RBC: 4.43 MIL/uL (ref 3.87–5.11)

## 2011-03-09 LAB — URINALYSIS, ROUTINE W REFLEX MICROSCOPIC
Glucose, UA: NEGATIVE mg/dL
Ketones, ur: 15 mg/dL — AB
Nitrite: NEGATIVE
Specific Gravity, Urine: 1.025 (ref 1.005–1.030)
pH: 6 (ref 5.0–8.0)

## 2011-03-09 LAB — URINE MICROSCOPIC-ADD ON

## 2011-03-09 LAB — POCT I-STAT, CHEM 8
BUN: 16 mg/dL (ref 6–23)
Calcium, Ion: 1.05 mmol/L — ABNORMAL LOW (ref 1.12–1.32)
Chloride: 107 mEq/L (ref 96–112)
Creatinine, Ser: 1.7 mg/dL — ABNORMAL HIGH (ref 0.4–1.2)
Glucose, Bld: 113 mg/dL — ABNORMAL HIGH (ref 70–99)
Potassium: 4.1 mEq/L (ref 3.5–5.1)

## 2011-03-09 LAB — CK TOTAL AND CKMB (NOT AT ARMC)
CK, MB: 1.9 ng/mL (ref 0.3–4.0)
Relative Index: INVALID (ref 0.0–2.5)
Total CK: 44 U/L (ref 7–177)

## 2011-03-09 LAB — TSH: TSH: 0.667 u[IU]/mL (ref 0.350–4.500)

## 2011-03-09 LAB — TROPONIN I: Troponin I: 0.02 ng/mL (ref 0.00–0.06)

## 2011-03-09 LAB — POCT CARDIAC MARKERS: Troponin i, poc: <0.05 ng/mL (ref 0.00–0.09)

## 2011-03-09 LAB — CARDIAC PANEL(CRET KIN+CKTOT+MB+TROPI)
CK, MB: 1.7 ng/mL (ref 0.3–4.0)
Total CK: 33 U/L (ref 7–177)

## 2011-03-16 LAB — BASIC METABOLIC PANEL
BUN: 21 mg/dL (ref 6–23)
Chloride: 105 mEq/L (ref 96–112)
Glucose, Bld: 113 mg/dL — ABNORMAL HIGH (ref 70–99)
Potassium: 3.8 mEq/L (ref 3.5–5.1)

## 2011-03-16 LAB — HEMOGLOBIN AND HEMATOCRIT, BLOOD: Hemoglobin: 14.1 g/dL (ref 12.0–15.0)

## 2011-05-11 NOTE — Assessment & Plan Note (Signed)
[facility]                          [facility] NOTE   Cynthia [name], Cynthia [name]                       MRN:          [id number]  DATE:05/28/2008                            DOB:          02-Sep-1931    PRIMARY CARE PHYSICIAN:  Donzetta Sprung, M.D.   PULMONOLOGIST:  Cherie Ouch, M.D.   REASON FOR VISIT:  Cardiac follow up.   HISTORY OF PRESENT ILLNESS:  Cynthia [name] was seen back in February.  She  did follow up with Dr. Orson Aloe, and I got his consultation note from  March.  She reports improvement in her shortness of breath with use of  p.r.n. albuterol inhalers.  Sometimes she reports wheezing in the  mornings and has a chronic cough.  This has been nonproductive.  She has  a general sense of tightness in her chest with this.  Otherwise, she is  not describing any frank anginal chest pain.  Her electrocardiogram  shows a stable paced ventricular rhythm.  We reviewed her medications  today.  We also talked about perhaps doing some pool exercises.   ALLERGIES:  NO KNOWN DRUG ALLERGIES.   MEDICATIONS:  1. Zoloft 50 mg p.o. daily.  2. Prevacid 30 mg p.o. daily.  3. Enteric-coated aspirin 325 mg p.o. daily.  4. Multivitamin daily.  5. Calcium supplements.  6. Amlodipine 5 mg p.o. daily.  7. Levothyroxine 88 mcg p.o. daily.  8. Sublingual nitroglycerin p.r.n.   REVIEW OF SYSTEMS:  As described in the present illness.   PHYSICAL EXAMINATION:  VITAL SIGNS:  Blood pressure is 141/82, heart  rate is 83, weight is 166 pounds.  Oxygen saturation is 96% on room air.  GENERAL:  The patient is comfortable and in no acute distress.  NECK:  No elevated jugular venous pressure or loud bruits.  LUNGS:  Diminished breath sounds.  No wheezing today.  CARDIAC:  Regular rate and rhythm.  No S3 gallop or murmur.  EXTREMITIES:  No pitting edema.   IMPRESSION/RECOMMENDATIONS:  Dyspnea on exertion with underlying chronic  lung disease with apparent  bronchospastic component given improvement on  albuterol.  This is being followed by Dr. Orson Aloe.  In addition, I  suspect she has diastolic dysfunction and therefore better blood  pressure control might be beneficial.  We will increase her amlodipine  to 10 mg daily.  She had reassuring coronary arteries at catheterization  last January  demonstrating a patent stent within the left anterior descending and  otherwise nonobstructive atherosclerosis.  We will plan to see her back  over the next 6 months.     [name], MD  Electronically Signed    SGM/MedQ  DD: 05/28/2008  DT: 05/28/2008  Job #: [id number]   cc:   Gaynell Face, M.D.

## 2011-05-11 NOTE — Assessment & Plan Note (Signed)
[facility]                         ELECTROPHYSIOLOGY OFFICE NOTE   Cynthia [name]                       MRN:          [id number]  DATE:07/14/2007                            DOB:          1931-11-15    Cynthia [name] comes in today.  She is status post pacemaker implantation  for complete heart block.  She is device dependent.  She has a history  of coronary artery disease with LAD stenting.  She has recent complaints  of worsening chest pain and exertional dyspnea.   About 2-1/2 years ago, she had problems with chest pain and underwent  catheterization at that time demonstrating patent stents.  She has  recently undergone eye surgery x2 and is having a significant amount of  pain in her left eye, vision which has been lost (see below).   CURRENT MEDICATIONS:  Synthroid, lisinopril, Zoloft, Prevacid, aspirin,  Toprol 25 and vitamins.   PHYSICAL EXAMINATION:  VITAL SIGNS:  Blood pressure 147/82, pulse 68.  LUNGS:  Clear.  HEART:  Heart sounds were regular.  EXTREMITIES:  Without edema.   Interrogation of her Guidant Meridian pulse generator demonstrates a P  wave of 1.2 with impedance of 430, threshold of 0.7 to 0.4, R wave is  13.6 with impedance of 840, threshold at 0.9 to 0.4.  She is approaching  ERI, but is not there yet.   IMPRESSION:  1. Complete heart block, status post pacemaker now approaching      elective replacement indicator (ERI).  2. Ischemic heart disease.      a.     Status post previously implanted left anterior descending       stent.      b.     Recent increase in exertional chest discomfort and shortness       of breath.  3. Previous episodes of noncardiac chest pain.  4. Recent eye surgery with ongoing pain.   PLAN:  Cynthia [name] is reluctant to proceed with further cardiac  evaluation at this time given the discomfort in her eye.  She is advised  that if she has increasing symptoms, she should follow up with [place]  sooner, but I have taken the liberty of setting her up to see Dr.  Diona Browner, whom she saw about 2 years ago, in about 4 weeks or so.  She  said that hopefully things would settle down and she would be willing to  pursue further cardiac evaluation in the form of stress testing or  echocardiography if Dr. Diona Browner felt that this was appropriate.  We  will continue her monitoring monthly and will see her again in 1 year's  time.    Duke Salvia, MD, [facility]  Electronically Signed   SCK/MedQ  DD: 07/14/2007  DT: 07/15/2007  Job #: [id number]

## 2011-05-11 NOTE — Assessment & Plan Note (Signed)
[facility]                          [facility]   Cynthia [name], Cynthia [name]                       MRN:          [id number]  DATE:02/13/2008                            DOB:          1931/03/11    PRIMARY CARE PHYSICIAN:  Dr. Donzetta Sprung.   REASON FOR VISIT:  Routine cardiology follow-up.   HISTORY OF PRESENT ILLNESS:  I last saw Cynthia [name] back in August 2008.  I referred her at that time for follow-up pulmonary testing as well as  an echocardiogram with a plan to see Dr. Orson Aloe.  She unfortunately  missed this follow-up visit. It has been rescheduled.  She continues to  report dyspnea on exertion at NYHA class II to III at times but no  progressive chest pain.  She had a pacemaker follow-up check with Dr.  Graciela Husbands back in January.  Her electrocardiogram shows a paced ventricular  rhythm without magnet and a dual-chamber paced rhythm with magnet.   In review of her follow-up studies, she had a CT scan of the chest in  August demonstrating stable appearing air space disease with partially  calcified lymph nodes.  This was described as being most consistent with  scarring of the lung parenchyma with no new abnormalities.  She has an  apparent history of sarcoidosis.  She had pulmonary function tests done  as well which showed an FEV-1 of 95% predicted and an FVC is 92%  predicted.  Echocardiography showed continued normal left ventricular  systolic function at 55-60% with left ventricular hypertrophy, no  segmental wall motion abnormalities and mild tricuspid regurgitation  without clear evidence of pulmonary hypertension.  There was no  pericardial effusion noted.   In reviewing her medications, I note that she is now off of both  lisinopril and Toprol XL.  She states that this was discontinued by Dr.  Neita Carp following a presentation with dizziness and dehydration.  She  was seen in consultation by Dr. Myrtis Ser at that time.   ALLERGIES:   No known drug allergies.   CURRENT MEDICATIONS:  1. Synthroid 0.075 mg p.o. daily.  2. Zoloft 50 mg p.o. daily.  3. Prevacid 30 mg p.o. daily.  4. Enteric coated aspirin 325 mg p.o. daily.  5. Multivitamin 1 p.o. daily.  6. Calcium supplements.  7. Sublingual nitroglycerin 0.4 mg p.r.n.   REVIEW OF SYSTEMS:  As described in the history of present illness. No  syncope.  No significant lower extremity edema.  No orthopnea or PND.  Otherwise negative.   PHYSICAL EXAMINATION:  Blood pressure is 160/89, heart rate is 77,  weight is 168 pounds which is stable in comparison to my previous note,  actually down 1 pound.  She is in no acute distress.  HEENT:  Conjunctiva is normal.  Pharynx clear.  NECK:  Supple.  No elevated jugular venous pressure and no loud bruits.  LUNGS:  Clear without labored breathing.  CARDIAC:  Regular rate and rhythm. Soft S3, soft systolic murmur.  No  pericardial rub.  ABDOMEN:  Soft, nontender.  EXTREMITIES:  Exhibit trace edema, nonpitting, distal  pulses 1 to 2+.  SKIN:  Warm and dry.  MUSCULOSKELETAL:  No kyphosis noted.  NEUROPSYCHIATRIC:  Patient alert and oriented x3.  Affect is normal.   IMPRESSION/RECOMMENDATIONS:  Dyspnea on exertion, likely multifactorial.  The patient has preserved left ventricular systolic function and no  clear evidence of pulmonary hypertension based on echocardiography from  August 2008. She does have underlying ischemic heart disease but no  typical anginal chest pain and had a cardiac catheterization last year  demonstrating patent stent within the left anterior descending with  otherwise nonobstructive disease in the remaining major epicardial  vessels.  She is hypertensive and is off of her prior antihypertensive  regimen.  It may well be that she has an element of diastolic  dysfunction.  It seems that her pulmonary status is overall stable based  on CT scan and pulmonary function testing, although I still would prefer   she see Dr. Orson Aloe for a formal pulmonary consultation. The patient  is aware that this has been rescheduled for March and we discussed this  again today. She reports a prior history of sarcoidosis.  Today we plan  to initiate Norvasc 5 mg daily and I will plan to see her back over the  next 3 months.     [name], MD  Electronically Signed    SGM/MedQ  DD: 02/13/2008  DT: 02/14/2008  Job #: [id number]   cc:   Consuela Mimes, M.D.  Duke Salvia, MD, [facility]

## 2011-05-11 NOTE — Cardiovascular Report (Signed)
[facility]                   [facility]IC NOTE   GENA, LASKI                       MRN:          [id number]  DATE:06/13/2008                            DOB:          Dec 31, 1930    HISTORY OF PRESENT ILLNESS:  Ms. Beining is seen following pacemaker  implantation in the year 2000, for complete heart block.  She has  reached ERI.  Over the last couple of months, she has felt  really  really worn out.  This may in fact predate her reaching the ERI.   MEDICATIONS:  1. Amlodipine 5.  2. Zoloft 50.  3. Prevacid.  4. Aspirin 325.  5. Levothyroxine 88.   ALLERGIES:  She has no known drug allergies.   PHYSICAL EXAMINATION:  VITAL SIGNS:  Her blood pressure is 142/87 and  her pulse is 84.  LUNGS:  Clear.  HEART:  Sounds were regular with an early systolic murmur.  ABDOMEN:  Soft.  NECK:  Veins were flat.  EXTREMITIES:  No edema.   Interrogation of her Guidant device demonstrates that she reached ERI a  month ago.  However, she has normal heart rate excursion and her pulse  is 84 suggesting, that chronotropic incompetence is not and the  reversion to DDD has no impact on her symptoms.   I should note that her atrial impedance was 430 with a threshold 0.6 at  0.4.  The RV impedance was 840 with threshold 0.7 at 0.4.   IMPRESSION:  1. Complete heart block.  2. Status post pacer for the above.  3. Relative deterioration in functional capacity without evidence of      chronotropic incompetence.   Ms. Payment needs to have her pulse generator changed.  I am, as  suggested above, not saying whether this will help her symptoms.  However, given her device dependence I think it is important that we  proceed with device implantation regardless of what the underlying  mechanism turns out to be and we will need to explore other explanations  for her exercise intolerance following device generator replacement if  in fact it  does not abate.   I have reviewed with her the potential benefits as well as the potential  risks including, but not limited to infection of the leads, and  malfunction.  She understands these risks and is willing to proceed.     Duke Salvia, MD, [facility]  Electronically Signed    SCK/MedQ  DD: 06/13/2008  DT: 06/14/2008  Job #: [id number]

## 2011-05-11 NOTE — Letter (Signed)
April 18, 2009    Donzetta Sprung  [address], [address]  [place], [place] [address]   RE:  Cynthia [name], Cynthia [name]  MRN:  [id number]  /  DOB:  [birth year]   Dear Aurther Loft,   Mrs. Lindo is seen in follow-up for complete heart block and she is  status post pacemaker generator replacement about a year ago.  At that  time she had presented following reversion and was quite symptomatic.  I  was not optimistic that all of her symptoms would resolve.  I thankfully  was wrong and she actually did beautifully [facility].   However, 2 days ago on her birthday while exercising she became very  sick and has had some persistent nausea since then.  She has been weak  and while her symptoms are improving she is still not able to eat well.  There has been no accompanying diarrhea or fever.   She does have some peripheral edema.   Her medications are notable for amlodipine 10 with intercurrent up  titration and maybe it is related to the edema, maybe it is not;  levothyroxine 88; simvastatin 40; aspirin; Prevacid and Zoloft 50.   On examination her blood pressure today was 142/83 with a pulse of 88,  her weight was 164 and stable.  There is no acute distress and she is  alert and oriented.  Her lungs were clear.  The heart sounds were  regular.  Pacemaker pocket was well healed.  The abdomen was soft with  active bowel sounds.  Extremities had trace edema.   Interrogation of her Medtronic Versapulse generator demonstrates a P-  wave of 1 with impedance of 432, a threshold 0.75 at 0.4.  There is no  intrinsic ventricular rhythm.  The impedance was 93 and threshold of 1 V  at 0.4.  Battery voltage is 2.81.  She is 100% ventricularly paced and  electrocardiogram confirms P synchronous pacing.   IMPRESSION:  1. Complete heart block.  2. Status post pacer for the above.  3. Hypertension - adequately controlled.  4. Edema, question related to amlodipine  being used for #3.    Mrs. Budhu, Aurther Loft  is doing well from arrhythmia point-of-view.  Her  device is functioning normally and I was very pleasantly surprised to  have been wrong about how much better and she would get following device  generator replacement after having reverted to a VVI pacing mode last  spring.   The episode that happened the other day hopefully is an acute infectious  illness.  It appears to be getting better and it may have been  aggravated acutely by her vigorous exercise.  I have asked her if it  does not resolve in the next couple of days to follow up with you.   The other issues the edema and whether it is related to amlodipine.  Her  blood pressures are high, but not so high, so I have set asked her to  take a 2-week trial of discontinuing her amlodipine and seeing if her  edema resolves.  In the event that it does, alternative antihypertensive  therapy could be considered.   Thanks very much for allowing [place] to see her.  We will see her again in 1  year's time.  She will follow up with Dr. Diona Browner the interim.    Sincerely,      Duke Salvia, MD, [facility]  Electronically Signed    SCK/MedQ  DD: 04/18/2009  DT: 04/18/2009  Job #: [id number]

## 2011-05-11 NOTE — Assessment & Plan Note (Signed)
[facility]                          [facility]   Cynthia [name], Cynthia [name]                       MRN:          [id number]  DATE:06/18/2009                            DOB:          07/10/1931    PRIMARY CARDIOLOGIST:  [name], MD   PRIMARY ELECTROPHYSIOLOGIST:  Duke Salvia, MD, [facility]   REASON FOR VISIT:  Scheduled followup.   [name] reports recent development of exertional dyspnea with  associated chest pressure.  She cannot recall if this is similar to her  presentation before undergoing stenting in February 2005.  Her last  catheterization in January 2008, by Dr. Riley Kill, indicated a widely  patent stent in the LAD.  Residual anatomy notable only for a 70%  ostial, tiny first obtuse marginal branch.  She was treated medically.   The patient also refers to recent development of palpitations, although  these do not appear to be very symptomatic.  They are also very brief in  duration.   [name] indicates that she has to climb 18 steps to get to her  apartment, with no associated chest pressure, but with some significant  shortness of breath.  However, she does report chest pressure when  performing household duties, which is relieved with rest.  She has not  used any p.r.n. nitroglycerin.   CURRENT MEDICATIONS:  1. Aspirin 325 daily.  2. Simvastatin 40 nightly.  3. Levothyroxine 0.088 daily.  4. Prevacid 30 daily.  5. Zoloft 50 daily.   REVIEW OF SYSTEMS:  The patient reports resolution of her pedal edema,  since Dr. Graciela Husbands stopped her amlodipine.  She denies any PND or  orthopnea.  All other systems reviewed, and are negative.   PHYSICAL EXAMINATION:  VITAL SIGNS:  Blood pressure 145/91, pulse 82 and  regular, weight 164.8.  GENERAL:  A 77 year old female sitting upright, no distress.  HEENT:  Normocephalic, atraumatic.  NECK:  Palpable carotid pulse without bruits; no JVD.  LUNGS:  Clear to  auscultation IN all fields.  HEART:  Regular rate and rhythm.  No significant murmurs.  No rubs.  ABDOMEN:  Soft, nontender.  EXTREMITIES:  No edema.  NEUROLOGIC:  No focal deficit.   IMPRESSION:  1. Exertional dyspnea/chest discomfort.  2. Single-vessel CAD.      a.     Status post drug-eluting stenting of the mid LAD, February       2005.  3. Normal LVF.  4. Complete heart block.      a.     Status post Medtronic pacemaker.      b.     Pacer dependent, by recent interrogation.  5. Hypertension.  6. Lower extremity edema.      a.     Resolved, off amlodipine.  7. Dyslipidemia.  8. Hypothyroidism.   PLAN:  In light of her symptoms, we have recommended the following:  A 2-  D echocardiogram for reassessment of left ventricular function, and rule  out of underlying structural abnormalities, and a pharmacologic  perfusion imaging study for assessment of ischemic burden.  Of note, the  patient has had several cardiac catheterizations since her percutaneous  intervention in December 2005, most recently in January 2008.  She,  however, refuses to schedule any of these tests at this time.  We have  encouraged her to follow-up with [place] in the office for symptom review  over the next month, and seek more urgent medical attention if her  symptoms worsen.      Rozell Searing, PA-C  Electronically Signed      [name], MD  Electronically Signed   GS/MedQ  DD: 06/18/2009  DT: 06/19/2009  Job #: [id number]   cc:   Donzetta Sprung

## 2011-05-11 NOTE — Op Note (Signed)
NAME:  Cynthia [name], Cynthia [name]                ACCOUNT NO.:  [id number]   MEDICAL RECORD NO.:  [id number]          PATIENT TYPE:  OIB   LOCATION:  2899                         FACILITY:  [facility]   PHYSICIAN:  Duke Salvia, MD, FACCDATE OF BIRTH:  02-11-1931   DATE OF PROCEDURE:  06/25/2008  DATE OF DISCHARGE:  06/25/2008                               OPERATIVE REPORT   PREOPERATIVE DIAGNOSIS:  Previously implanted pacemaker now at end-of-  life.   POSTOPERATIVE DIAGNOSIS:  Previously implanted pacemaker now at end-of-  life.   PROCEDURE:  Explantation of a previously implanted generator,  implantation of a new generator with pocket revision.   Following obtaining informed consent, the patient was brought to the  Electrophysiology Laboratory and placed on the fluoroscopic table in a  supine position.  After routine prep and drape, lidocaine was  infiltrated along the line of the previous incision.  It was carried  down to the device pocket using sharp dissection and electrocautery.  The pocket was opened with care.  The device was freed up and explanted.  The previously implanted ventricular lead was a [id number] lead, serial  #[id number] was made by [employer] and the R-wave was 11.7 with a pace  impedance of 802, a threshold of 0.8 volts at 0.5 milliseconds.  The  current threshold was 1.2 mA.   Previously implanted atrial lead was a [employer] [id number], serial [id number].  P-wave was 3 with a pace impedance of 391, a threshold of 0.5 volts at  0.5 milliseconds.  Current threshold was 1.5 mA.  These leads were then  attached to a Medtronic Versa VEDR01 pulse generator, serial  #[id number].  The pocket was copiously irrigated with antibiotic-  containing saline solution.  The floor of the pocket was excised in its  majority.  The lead of the pulse generator was then placed back into the  pocket and secured to the prepectoral fascia.  The wound was washed,  dried, and benzoin and Steri-Strips dressing was  applied.  Needle  counts, sponge counts, and instrument counts were correct at the end of  the procedure according to the staff.  The patient tolerated the  procedure without apparent complication.      Duke Salvia, MD, [facility]  Electronically Signed     SCK/MEDQ  D:  09/05/2008  T:  09/05/2008  Job:  [id number]

## 2011-05-11 NOTE — Assessment & Plan Note (Signed)
[facility]                          [facility] NOTE   Cynthia [name], Cynthia [name]                       MRN:          [id number]  DATE:08/09/2007                            DOB:          1931-10-03    PRIMARY CARE PHYSICIAN:  Dr. Donzetta Sprung.   ELECTROPHYSIOLOGIST:  Duke Salvia, M.D., [facility].   REASON FOR VISIT:  Re-establish cardiology followup.   HISTORY OF PRESENT ILLNESS:  I last saw Cynthia [name] back in the clinic  back in November of 2006.  She has a history of coronary artery disease  status post drug-eluting stent placement to the left anterior descending  in February of 2005, as well as a possible history of sarcoidosis based  on limited information.  Additional problems include previously  documented high-degree heart block status post pacemaker placement.  She  was actually just recently seen in July by Dr. Graciela Husbands for a device clinic  visit.  Her device was interrogated and functioning appropriately.  She  was noted to be approaching ERI, but was not yet close enough for  specific intervention.  She mentioned to Dr. Graciela Husbands a history of chest  pain.  Was referred back to the office to discuss this further.   In reviewing her interval history, I note that she was admitted to [facility] actually in January of this year, and underwent a repeat  cardiac catheterization at that time by Dr. Riley Kill.  She was noted to  have a patent stent within the left anterior descending with perhaps  only 30% stenosis in the left anterior descending near the take-off of  the first diagonal.  The circumflex had no significant stenosis.  There  was noted to be a tiny first obtuse marginal that had a 70% osteal  stenosis.  The right coronary artery had no significant disease either.  She had a ventilation perfusion lung scan during that hospital stay,  which was low probability for pulmonary embolus.  It was noted that she  had had a previous  CT scan of the chest in June of 2007 with abnormal  findings including a moderate amount of hilar and mediastinal adenopathy  with calcification suggesting either sarcoidosis or perhaps prior  infection.  She had a partially calcified right middle lobe nodule, felt  to be potentially a granuloma, and less likely a malignancy due to the  amount of calcification.  Patchy parenchymal lung scarring was noted  bilaterally as well.  A followup CT scan was suggested in approximately  6 months.  I spoke with Cynthia [name] today and reviewed her evaluation  from January.  She reports feeling tired and weak with activity,  sometimes also around bedtime.  She has occasional chest discomfort  without any significant change in pattern, and not necessarily always  associated with exertion.  She does report having some interval eye  surgery on the left eye at a facility in [place], and also at [facility].  I have no details in this regard.   Today, we talked about her lung findings by CT scan  and her prior  reported history of sarcoidosis.  She does not report seeing a  pulmonologist on any regular basis and not for many, many years since  seeing one in [place].  She sounds as if she may have undergone a lung  biopsy in years past.   ALLERGIES:  No known drug allergies.   PRESENT MEDICATIONS:  1. Synthroid 0.075 mg p.o. daily.  2. Lisinopril 10 mg p.o. daily.  3. Zoloft 50 mg p.o. daily.  4. Trazodone 25 mg p.o. nightly.  5. Prevacid 30 mg p.o. daily.  6. Enteric-coated aspirin 325 mg p.o. daily.  7. Toprol XL 25 mg p.o. daily.  8. Multivitamin daily.  9. Sublingual nitroglycerin p.r.n.   REVIEW OF SYSTEMS:  As described in the history of present illness.  She  has had no hemoptysis.  No palpitations or syncope.   EXAMINATION:  Blood pressure is 148/86, heart rate is 63, weight is 169  pounds, oxygen saturation is 93% on room air.  The patient is comfortable and in no  acute distress.  Denying any active  chest pain.  HEENT:  Conjunctivae looks normal.  Oropharynx is clear.  NECK:  Supple.  No elevated jugular venous pressure or loud bruits.  No  thyromegaly is noted.  LUNGS:  Somewhat coarse breath sounds, particularly at the right base.  No egophony.  No wheezing noted.  CARDIAC:  Regular rate and rhythm.  Soft systolic murmur.  No S3 gallop  or pericardial rub.  ABDOMEN:  Soft.  Normoactive bowel sounds.  EXTREMITIES:  No significant pitting edema.  SKIN:  Warm and dry.  MUSCULOSKELETAL:  No kyphosis is noted.  NEURO/PSYCH:  The patient is alert and oriented x3.   A 12-lead electrocardiogram today shows a paced ventricular rhythm with  atrial sensing and a heart rate of 64 beats per minute.   IMPRESSION/RECOMMENDATIONS:  1. Symptoms of fatigue and weakness as well as occasional chest pain      and shortness of breath.  Coronary angiography in January of this      year demonstrated stent patency within the left anterior descending      and no other major obstructive disease in the major epicardial      vessels.  She had a ventilation perfusion lung scan at that time      that was a low probability for a pulmonary embolus.  Previous CT      scan of the chest at [facility] in June of 2007 did      demonstrate abnormal findings as outlined above, and given the      patient's reported history of sarcoidosis, I feel that this clearly      needs further investigation.  I discussed this with the patient      today and planned to refer her to see Dr. Cherie Ouch here in      [place] for a pulmonary consultation.  In advance of this visit, we      will plan a non-contrasted CT scan of the chest, ambulatory oxygen      saturation, full pulmonary function tests, and a 2D echocardiogram      to assess both left and right ventricular function, as well as      pulmonary artery pressure.  She was in agreement with this and we      will proceed  forward as planned.  2. Otherwise, from a purely cardiac perspective, we will plan to see  her back for symptom review in the next 6 months.     [name], MD  Electronically Signed    SGM/MedQ  DD: 08/09/2007  DT: 08/10/2007  Job #: [id number]   cc:   Veatrice Kells, MD, [facility]  Cherie Ouch

## 2011-05-11 NOTE — Assessment & Plan Note (Signed)
[facility]                          [facility] NOTE   Cynthia [name], Cynthia [name]                       MRN:          [id number]  DATE:12/12/2008                            DOB:          12-May-1931    PRIMARY CARE PHYSICIAN:  Dr. Donzetta Sprung.   REASON FOR VISIT:  Routine followup.   Cynthia [name] is doing very well since her last visit.  She states that  her breathing has improved significantly.  She is actually interested in  joining the [facility] for regular exercise regimen, and we talked about this  some today.  She seems to be tolerating simvastatin at 40 mg p.o. daily  and reports that she had improved cholesterol numbers when she saw Dr.  Reuel Boom back in followup recently.  Her LDL was up to 253 back in  February 2009.  She does have a prior problem with statin intolerance,  but seems to be doing okay at this point.  Otherwise she is not  reporting any angina.   ALLERGIES:  No known drug allergies.   MEDICATIONS:  1. Zoloft 50 mg p.o. daily.  2. Prevacid 30 mg p.o. daily.  3. Aspirin 325 mg p.o. daily.  4. Multivitamin 1 p.o. daily.  5. Calcium with vitamin D supplements.  6. Amlodipine 5 mg p.o. daily.  7. Levothyroxine 88 mcg p.o. daily.  8. Simvastatin 40 mg p.o. nightly.  9. Sublingual nitroglycerin 0.4 mg p.r.n.   REVIEW OF SYSTEMS:  As described in the history of present illness.  Otherwise, negative.   PHYSICAL EXAMINATION:  Blood pressure is 141/83, heart rate is 771,  weight is 168 pounds.  The patient is comfortable and in no acute  distress.  Examination of the neck reveals no elevated jugular venous  pressure.  No loud bruits.  Lungs are clear without labored breathing at  rest.  Cardiac exam reveals a regular rate and rhythm.  No S3 gallop or  pathologic systolic murmur.  Extremities exhibit no pitting edema.   IMPRESSION AND RECOMMENDATIONS:  1. Coronary artery disease status post stent placement to the left  anterior descending, patent angiography in January 2008.  At this      point, we will plan to continue medical therapy.  I did encourage      her to begin a regular exercise regimen, likely focusing on walking      or use of a stationary bicycle.  We will plan to see her back in 6      months.  2. Hyperlipidemia with significant elevated LDL cholesterol.  The      patient is tolerating simvastatin 40 mg daily at least over the      last 3 months, and reports that her lipid numbers      have come down significantly and follow up with Dr. Reuel Boom.  Try to      aim for LDL control as close to 70 as possible depending on her      tolerance for medical therapy.     [name], MD  Electronically Signed    SGM/MedQ  DD: 12/12/2008  DT: 12/12/2008  Job #: [id number]   cc:   Donzetta Sprung

## 2011-05-14 NOTE — Cardiovascular Report (Signed)
NAME:  Cynthia [name], Cynthia [name]                          ACCOUNT NO.:  [id number]   MEDICAL RECORD NO.:  [id number]                   PATIENT TYPE:  OIB   LOCATION:  2866                                 FACILITY:  [facility]   PHYSICIAN:  Carole Binning, M.D. [facility]         DATE OF BIRTH:  Oct 12, 1931   DATE OF PROCEDURE:  12/26/2004  DATE OF DISCHARGE:                              CARDIAC CATHETERIZATION   PROCEDURE:  1. Left heart catheterization with coronary angiography and left     ventriculography.  2. Percutaneous transluminal coronary angioplasty with placement of a drug-     eluting stent in the mid-left anterior descending artery.   INDICATIONS:  Cynthia [name] is a 77 year old woman with history of previous  pacemaker placement. She presented to [facility] with symptoms of  recurrent chest pain occurring at rest.  She ruled out for myocardial  infarction. Subsequent adenosine Cardiolite scan revealed apical ischemia.  She was therefore referred for cardiac catheterization.   PROCEDURE NOTE:  A 6-French sheath was placed in the right femoral artery.  Coronary angiography was performed with a 6-French JL-4 and JR-4 catheters.  Left ventriculography was performed with a angeled pigtail catheter.  Contrast was Omnipaque. There were no complications.   CATHETERIZATION RESULTS:  Hemodynamics:  Left ventricular pressure 140/10,  aortic pressure 140/64. There was no aortic valve gradient.   LEFT VENTRICULOGRAM:  Wall motion is normal. Ejection fraction estimated at  60%. There is 1+ mild mitral regurgitation.   CORONARY ANGIOGRAPHY (CODOMINANT):  Left main is normal.   Left anterior descending artery has diffuse luminal irregularities in the  proximal to mid-vessel. In the distal portion of the mid-LAD, there is a  discrete 80% stenosis occurring on a fairly sharp band within the artery.  Beyond this, the LAD is a fairly large vessel curling around the apex. The  LAD gives rise to 4  small diagonal branches.   Left circumflex is a large, codominant vessel. Circumflex gives rise to 3  small obtuse marginal branches and a large posterolateral branch. The left  circumflex is normal.   Right coronary artery has a 25% stenosis in the proximal vessel. The right  coronary artery is otherwise normal giving rise to a normal sized posterior  descending artery and 2 small posterolateral branches.   IMPRESSIONS:  1. Normal left ventricular systolic function.  2. One vessel coronary artery disease characterized by an 80% stenosis in     the distal portion of the mid-left anterior descending. This does     correspond with the area of ischemia seen on the Cardiolite scan.   PLAN:  Percutaneous intervention of the left anterior descending artery. See  below.   PERCUTANEOUS TRANSLUMINAL CORONARY ANGIOPLASTY PROCEDURAL NOTE:  Following  completion of the diagnostic catheterization which showed percutaneous  coronary intervention to be utilized.  We utilized the preexisting 6-French  sheath in the right femoral artery. Heparin and Integrilin were administered  per protocol.  We used a 6.5 JL 3.5 Guidant catheter. Then IQ coronary guide  wire was advanced under fluoroscopic guidance into the distal LAD. We then  performed PTCA with a 2.5 x 12 mm Quantum balloon positioned across the  lesion and inflated to 10 atmospheres. We then positioned a 2.5 x 16 mm  Taxus  drug-eluding stent across the stenosis in the mid-LAD and deployed the stent  at 11 atmospheres.  We then went back in with a 2.5 x 12 mm Quantum balloon  and inflated this balloon to 22 atmospheres in the proximal aspect of the  stent and 16 atmospheres in the distal aspect of the stent.  Finally, we  went back with a 2.75 x 12 mm Quantum balloon and inflated it to 22  atmospheres in the proximal aspect of the stent.   Final angiographic images were obtained revealing patency of the LAD with  10% residual stenosis at the  stent site and  TIMI-III flow into the distal vessel.   COMPLICATIONS:  None.   RESULTS:  Successful PTCA with placement of a drug-eluding stent in the mid  left anterior descending artery, an 80% stenosis was reduced to 10% residual  with TIMI-III flow.   PLAN:  Integrilin will be continued for 18 hours.  It is recommended the  patient be treated with Plavix for a minimum of 6 months, along with life-  long aspirin care.                                               Carole Binning, M.D. [facility]    MWP/MEDQ  D:  02/21/2004  T:  02/23/2004  Job:  [id number]   cc:   Donzetta Sprung  [address], [address]  [place]  [place] [address]  Fax: [phone]   [facility], [facility]   Cath Lab

## 2011-05-14 NOTE — Op Note (Signed)
NAME:  Slabach, Latreece S                          ACCOUNT NO.:  [id number]   MEDICAL RECORD NO.:  [id number]                   PATIENT TYPE:  AMB   LOCATION:  DAY                                  FACILITY:  [facility]   PHYSICIAN:  R. Roetta Sessions, M.D.              DATE OF BIRTH:  03-11-1931   DATE OF PROCEDURE:  03/27/2003  DATE OF DISCHARGE:                                 OPERATIVE REPORT   PROCEDURE:  EGD with Elease Hashimoto dilation.   INDICATIONS FOR PROCEDURE:  The patient is a 77 year old lady with recurrent  esophageal dysphagia.  She responded nicely to esophageal dilation  previously per report.  An EGD is now being done to further evaluate her  dysphagia.  She has intermittent postprandial abdominal cramps and diarrhea.  I gave her some NuLev through the office.  This was associated with marked  improvement in those symptoms.  She continues to take Prevacid 30 mg orally  daily for gastroesophageal reflux disease.  This approach has been discussed  with the patient previously.  The potential risks, benefits, and  alternatives have been reviewed and questions answered.  She is agreeable.  Please see my documented history and physical and the consultation of  03/20/03.   PROCEDURE NOTE:  O2 saturation, blood pressure, pulse, and respirations were  monitored throughout the entire procedure.   CONSCIOUS SEDATION:  Versed 2.5 mg IV, Demerol 50 mg IV in divided doses.   INSTRUMENT:  Olympus video chip adult gastroscope.   FINDINGS:  Examination of the tubular esophagus revealed no mucosal  abnormality.  The EG junction was easily traversed.   STOMACH:  The gastric cavity was emptied and insufflated well with air.  Thorough examination of the gastric mucosa including retroflexed view of the  proximal stomach and esophagogastric junction demonstrated only a small  hiatal hernia.  The pylorus was patent and easily traversed.   DUODENUM:  The bulb and second portion appeared normal.   THERAPEUTIC/DIAGNOSTIC MANEUVERS:  A 56 French Maloney dilator was passed to  full insertion with ease pulling back without apparent complication related  to patient's dilator.  The patient tolerated the procedure well and was  reactive in endoscopy.   IMPRESSION:  1. Normal esophagus.  2. Small hiatal hernia.  3. Otherwise normal stomach.  4. Normal D1 and D2.    RECOMMENDATIONS:  1. Continue Prevacid 30 mg orally daily for GERD.  2. Continue NuLev AC and HS PRN abdominal cramps and diarrhea.  3. Appointment to see me back in the office in three months.                                               Jonathon Bellows, M.D.    RMR/MEDQ  D:  03/27/2003  T:  03/27/2003  Job:  [id number]   cc:   Donzetta Sprung  [address]  [place]  [place] [address]  Fax: [phone]

## 2011-05-14 NOTE — H&P (Signed)
NAME:  Cynthia [name], Cynthia [name]                ACCOUNT NO.:  [id number]   MEDICAL RECORD NO.:  [id number]          PATIENT TYPE:  INP   LOCATION:  2910                         FACILITY:  [facility]   PHYSICIAN:  Doylene Canning. Ladona Ridgel, M.D.  DATE OF BIRTH:  Aug 17, 1931   DATE OF ADMISSION:  05/25/2006  DATE OF DISCHARGE:                                HISTORY & PHYSICAL   ADMISSION DIAGNOSIS:  Unstable angina.   CHIEF COMPLAINTS:  Chest pain, shortness of breath.   PRESENT ILLNESS:  The patient is a 77 year old woman with a history of known  coronary disease status post angioplasty and stenting of the mid-LAD back in  2005.  She underwent catheterization 2006 for chest pain which demonstrated  no obstructive disease.  No new obstructive disease and no significant in-  stent restenosis.  The patient was in her usual state of health until about  1 month ago when she describes urinating and then suddenly passing out which  was preceded by fairly profound nausea and diaphoresis.  When she awoke she  remained diaphoretic and nauseated and felt poorly for some time.  She had  no additional episodes but a week ago noted increasing shortness of breath  and substernal chest discomfort radiating into the neck and jaw.  This has  been related with progressively less and less levels of exertion.  Today her  symptoms worsened even more so and she presented to the emergency room in  [place], [place] for additional evaluation.  There cardiac enzymes were  borderline elevated.  Her EKG demonstrated ventricular pacing and sinus  rhythm and she is now referred for additional evaluation.  She is now pain  free after Lovenox and nitrates, aspirin and beta blockers.   PAST MEDICAL HISTORY:  Is notable for bradycardia status post pacemaker  insertion back in 2000 with a Guidant Meridian device placed at that time.  She has a history of hypothyroidism is on Synthroid.  She is a history of  hypertension.  There is a  questionable history of pulmonary sarcoid which I  do not have documented well.   SOCIAL HISTORY:  The patient denies tobacco and ethanol use.   FAMILY HISTORY:  Is unremarkable.   REVIEW OF SYSTEMS:  Is notable that she wears glasses for visual acuity.  She has chronic dyspnea but this is worse. She has syncope as previously  noted.  She has chest pain as previously noted.  She denies nausea,  vomiting, diarrhea, constipation except as previously noted.  She denies  polyuria, polydipsia or cold intolerance.  She has chronic arthritis in the  back and she denies any skin problems.  She denies any neurologic problems.  She denies any easy bruisability or hematologic problems.   PHYSICAL EXAMINATION:  GENERAL:  She is a pleasant well-appearing 89 old  woman in no acute distress.  VITAL SIGNS:  Blood pressure today was 107/76, the pulse is 72 and regular,  respirations were 18.  Temperature is 98.  HEENT: Normocephalic and atraumatic.  Pupils are round.  Oropharynx is  moist.  Sclerae anicteric.  NECK:  Neck revealed no jugular distress.  There is no thyromegaly.  Trachea  is midline.  Carotids are 2+ and symmetric.  LUNGS: Clear bilaterally to auscultation.  There are no wheezes, rales or  rhonchi.  There is no increased work of breathing.  HEART:  Cardiac exam regular rhythm with normal S1-S2 with no murmurs, rubs  or gallops present.  PMI was not takes enlarged for laterally displaced.  ABDOMEN:  The abdomen is soft, nontender, nondistended.  No organomegaly.  Bowel sounds present.  No rebound or guarding.  EXTREMITIES:  Extremities demonstrate no cyanosis, clubbing or edema.  Pulses were 2+ symmetric.  NEUROLOGIC:  Alert and oriented x3 with cranial nerves intact.  Strength 5/5  and symmetric.   The EKG demonstrates sinus rhythm with P synchronous ventricular pacing.   LABORATORY DATA:  Labs from [facility] demonstrated borderline  troponins and others were notable for  creatinine of 1.4.   IMPRESSION:  1.  Chest pain, shortness of breath consistent with unstable angina.  2.  Known coronary disease status post angioplasty and stenting in 2005.  3.  History of bradycardia and heart block status post pacemaker insertion.  4.  Hypothyroidism.  5.  Hypertension.  6.  Questionable pulmonary sarcoid.   DISCUSSION:  I recommended we proceed with catheterization in the morning.  We will plan on in the interim checking cardiac enzymes and EKGs and the  patient is now painfree.  We will plan to interrogate the pacemaker and  check fasting lipids and will continue beta blockers, Lovenox, aspirin, ACE  inhibitors and thyroid supplement, Zoloft, trazodone, and Prevacid.           ______________________________  Doylene Canning. Ladona Ridgel, M.D.     GWT/MEDQ  D:  05/25/2006  T:  05/25/2006  Job:  [id number]   cc:   [name], M.D. [facility]  [address]  [place]  [place] [address]

## 2011-05-14 NOTE — Cardiovascular Report (Signed)
[place]  Patient:    Cynthia, [name]                         MRN: [id number] Proc. Date: 02/11/00 Adm. Date:  [id number] Attending:  Lewayne Bunting CC:         Veneda Melter, M.D. [facility]             Rudene Christians. Ladona Ridgel, M.D. [facility]             Donzetta Sprung, M.D., [place], [place]                        Cardiac Catheterization  PROCEDURES PERFORMED: 1. Left heart catheterization. 2. Left ventriculogram. 3. Perclose suture closure, right femoral artery.  DIAGNOSES: 1. Trivial coronary artery disease by angiogram. 2. Normal left ventricular systolic function.  INDICATIONS:  Cynthia [name] is a 77 year old white female with a history of heart block status post permanent pacemaker, sarcoidosis, cervical myelopathy, and spondylosis, who presents with substernal chest discomfort.  The patient has a history of chronic chest pain and has undergone two cardiac catheterizations in the past which have been unremarkable.  In addition she has had negative Cardiolite studies as  recently as September of 2000.  The patient was admitted to the hospital to undergo lumbar epidural for low-back pain.  However, due to her complaints of chest discomfort, cardiac assessment was requested.  She presents now for left heart catheterization to definitively define her coronary anatomy.  TECHNIQUE:  After informed consent was obtained, the patient was brought to the  cardiac catheterization lab where both groins were sterilely prepped and draped. Lidocaine 0.1% was used to infiltrate the right groin, and a 6 French sheath placed in the right femoral artery using the modified Seldinger technique.  A 6 [place] and JR4 catheters were then used to engage the left and right coronary arteries, and selective angiography was performed in various projections using manual injections of contrast.  A 6 French pigtail catheter was then advanced to the left ventricle and the left ventriculogram was  performed using power injections of contrast.  At the termination of the case, a Perclose suture device was deployed to the right femoral artery until adequate hemostasis was achieved.  The patient tolerated the procedure well and was transferred to the floor in stable condition.  FINDINGS:  Left main trunk:  Angiographically normal.  Left anterior descending:  This is a medium caliber vessel that provides three diagonal branches.  The LAD system has luminal irregularities only.  Left circumflex artery:  This is a medium caliber vessel that provides two small marginal branch in the mid section and a large third marginal branch in the distal section.  The left circumflex artery has luminal irregularities.  Right coronary artery:  Dominant.  This is a small caliber vessel that provides a small posterior descending artery as well as a posterior ventricular branch in ts terminal segment.  There are luminal irregularities in the right coronary system.  LEFT VENTRICULOGRAM:  Normal end-systolic and end-diastolic dimensions. Overall, left ventricular function is well-preserved, ejection fraction of greater than 5%.  No mitral regurgitation.  LV pressure is 101/4, aortic 101/54, LVEDP equals 8.   ASSESSMENT AND PLAN:  Cynthia [name] is a 77 year old white female with chronic chest pain of unclear etiology.  The patient has had trivial coronary artery disease y angiogram and other causes of her pain will be  investigated. DD:  02/11/00 TD:  02/11/00 Job: [id number]

## 2011-05-14 NOTE — Discharge Summary (Signed)
[place]  Patient:    Cynthia [name], Cynthia [name]                         MRN: [id number] Adm. Date:  [id number] Disc. Date: 02/11/00 Attending:  Lewayne Bunting Dictator:   Leonides Cave, P.A. CC:         Doylene Canning. Ladona Ridgel, M.D. [facility]             Dr. Reuel Boom, [place], [place].                           Discharge Summary  DATE OF BIRTH:  Mar 23, 1931.  DISCHARGE DIAGNOSES:  1. Chest pain, noncardiac origin.  Patient with normal coronary anatomy on cardiac     catheterization on February 11, 2000.  2. Patient admitted to the short-stay on February 11, 2000 for a scheduled     outpatient steroid epidural injection for low back and lower extremity pain.     (This procedure canceled due to patients chest pain and cardiac workup).  3. Status post permanent pacemaker secondary to 2:1 symptomatic heart block.  4. History of sarcoidosis per patient.  5. History of normal coronary catheterizations x 2, most recently in 1995 in     [place].  6. Chronic chest pain.  7. History of C6-C7 cervical myelopathy/spondylosis with surgery on     September 03, 1999.  8. Hypercholesterolemia.  9. Hypothyroidism. 10. History of depression. 11. Nephrolithiasis.  BRIEF HISTORY:  A 77 year old white female with multiple complaints. The patient was admitted to the short-stay hospital on February 11, 2000 to have a lumbar epidural for low back, lower extremity pain through the radiology department. he patients M.D. refused to do the procedure as the patient was complaining of worsening chest pain over 48 hours.  The patient has had chronic chest pain over 10 years and has reported two negative catheterizations in the past.  The most recent was in 90 in [place].  The patient does not remember the name of the hospital and those records were unobtainable.  In October, the patient was hospitalized at [facility] for cervical surgery.  Postoperatively, the patient was found to have 2:1  heart  block and had a DDD permanent pacemaker implanted by Dr. Lewayne Bunting.  This is how the [facility] cardiology team came to know the patient.  That procedure was on September 04, 1999.  Since that discharge, the patient has complained of extreme  weakness and decreased activity tolerance and chest pain.  Forty-eight hours ago, the patient had fairly severe chest pain which radiated to her left arm and she  also stated she had shortness of breath and diaphoresis with this.  When seen in the short-stay center, she currently had denied chest pain.  Of further note, she had a negative adenosine Cardiolite on September 08, 1999 which revealed no ischemia and an ejection fraction of 53%.  The decision was made that the patient should undergo cardiac catheterization to completely rule out coronary artery disease as the cause of the patients chest pain.  HOSPITAL COURSE: The patient was taken to the catheterization lab on the same day, February 11, 2000, by Dr. Veneda Melter.  Results are left main normal.  LAD with  minimal luminary irregularities.  The left circumflex with two small obtuse marginal branches and a large OM-3 distally.  RCA had a small PDA and PV branch  with luminal  irregularities. There was no coronary artery disease found.  There was no MR.  Ejection fraction was greater than 65%.  The patient tolerated the procedure very well.  The patient subsequently went home on that same evening on the same medications she came into the hospital on.  They include Synthroid, Prozac, Prilosec, and coated aspirin as taken at home.  She was instructed to undergo no heavy lifting, driving or sexual activity for two days.  She was told to continue a low fat, low cholesterol diet.  She was told that if bleeding or swelling occurred at the catheterization site, she should call the [facility] office immediately.  She was asked to call her primary care physician to re-setup her  epidural injection, as the radiology department on the evening of February 11, 2000 and they did not want to admit the patient and could not do the procedure on the same evening or the following day.  This is why the patient was discharged home on the same evening.  The patient does not need a cardiology follow-up but will continue to see Dr. Ladona Ridgel for her pacemaker needs. DD:  02/11/00 TD:  02/11/00 Job: [id number]

## 2011-05-14 NOTE — Consult Note (Signed)
NAME:  Cynthia [name], Tyrell NO.:  [id number]   MEDICAL RECORD NO.:  [id number]                  PATIENT TYPE:   LOCATION:                                       FACILITY:   PHYSICIAN:  R. Roetta Sessions, M.D.              DATE OF BIRTH:  May 10, 1931   DATE OF CONSULTATION:  03/20/2003  DATE OF DISCHARGE:                                   CONSULTATION   REASON FOR REFERRAL:  Esophageal dysphagia, long-standing reflux symptoms.   HISTORY OF PRESENT ILLNESS:  The patient is a pleasant 77 year old Caucasian  female kindly sent over out of courtesy of Dr. Donzetta Sprung to evaluate a  two month history of worsening symptoms of esophageal dysphagia.  The  patient tells me for years she has had intermittent difficulties following  salads, light meat and bread.  Over the past couple of months it has gotten  significantly worse at the point she has had to subsist on cream potatoes  and cereal.  She tells me she has lost three pounds in the last couple of  weeks.  She has been on various proton pump inhibitor for control of long-  standing reflux symptoms.  Currently she is on Prevacid 30 mg orally daily.  She does not have any odynophagia.  No nausea or vomiting.  Has not had any  melena or rectal bleeding recently.  Although, she did have some bleeding a  year and a half or so ago for which she underwent a colonoscopy by Dr. Tawni Millers in [place].  No significant findings were reported at that time.  There is no history of tobacco or alcohol intake.  She gives a history of  having an EG some 5-6 years ago and was told she had an ulcer.  She  describes undergoing esophageal dilation for similar symptoms some 15-20  years ago.  Symptoms are stable.  She does bear liquids and a full liquid-  type diet as stated above.   PAST MEDICAL HISTORY:  Significant for sarcoidosis, quiescent.  She takes an  occasional inhaler on a p.r.n. basis.  She is status post pacemaker  placement, history of hyperthyroidism on replacement, history of  hypercholesterolemia.   PAST SURGICAL HISTORY:  She has had two disc surgeries previously.   MEDICATIONS:  1. Synthroid dose unknown.  2. Prevacid 30 mg daily.  3. ASA once daily.  4. Was on Lipitor but stopped it because of the expense.   ALLERGIES:  No known drug allergies.   FAMILY HISTORY:  Father and mother significant for heart related disease.  There is no history of chronic GI or liver disease.   SOCIAL HISTORY:  The patient is divorced, has two living children.  She lost  one of her son's just recently __________  .  She is retired.  She does not  use tobacco or alcohol.   REVIEW OF SYSTEMS:  No  recent chest pain, dyspnea.  No fever, chills.  Weight loss as outlined above.   PHYSICAL EXAMINATION:  GENERAL APPEARANCE:  This is a pleasant 78 year old  lady resting comfortably.  VITAL SIGNS:  Weight 151, height 5 feet 4.5 inches.  Temperature 97,8, blood  pressure 110/70, pulse 66.  SKIN:  Warm and dry.  No lesions.  HEENT:  No scleral icterus.  Oral cavity no lesions.  JVD is not prominent.  CHEST:  Lungs are clear to auscultation.  CARDIAC:  Regular rate and rhythm without murmurs, rubs or gallops.  ABDOMEN:  Nondistended.  She has minimal epigastric tenderness to palpation.  No appreciable mass or organomegaly.  EXTREMITIES:  No edema.   IMPRESSION:  The patient is a 77 year old lady with a long-standing history  of gastroesophageal reflux symptoms and esophageal dysphagia.  She had  undergone esophageal dilation years ago.  She has worsening of chronic  esophageal dysphagia with fairly well-controlled reflux symptoms (now on  Prevacid).  I suspect she has a ring and/or peptic stricture to account for  esophageal dysphagia.  I do suspect she will end up having benign disease.  If she needs further evaluation, I have offered the patient an EGD.  Discussed the approach of EGD along with potential risks,  benefits,  alternatives.  Potential for esophageal dilation was discussed.  Her  questions were answered.  She is agreeable.   Not mentioned above, she does have occasional postprandial abdominal cramps  and urgent __________  stool and has some diarrhea with long periods of  relatively normal bowel function in between episodes, most consistent with  irritable bowel syndrome.  I have some new NuLev tablets in the office.  I  told her that I felt she had diarrhea predominant irritable bowel syndrome  and have asked her to try some NuLev one tablet orally after four meals and  at bedtime on a p.r.n. basis.  We will make further recommendations after  EGD has been performed.   I would like to thank Dr. Donzetta Sprung for allowing me to see this nice lady  today.                                                Jonathon Bellows, M.D.    RMR/MEDQ  D:  03/20/2003  T:  03/21/2003  Job:  [id number]   cc:   Donzetta Sprung  [address], [address]  [place]  [place] [address]  Fax: [phone]

## 2011-05-14 NOTE — Discharge Summary (Signed)
NAME:  Cynthia [name], Cynthia [name]                ACCOUNT NO.:  [id number]   MEDICAL RECORD NO.:  [id number]          PATIENT TYPE:  INP   LOCATION:  2001                         FACILITY:  [facility]   PHYSICIAN:  [name], M.D. LHCDATE OF BIRTH:  [birth year]   DATE OF ADMISSION:  05/25/2006  DATE OF DISCHARGE:  05/27/2006                                 DISCHARGE SUMMARY   PROCEDURES:  1.  Cardiac catheterization.  2.  Coronary arteriogram.  3.  Left ventriculogram.  4.  CT of the chest with contrast.   PRIMARY DIAGNOSIS:  Chest pain, cardiac catheterization showing patent stent  and no other critical disease with an ejection fraction of 55% and CT  without pulmonary embolus.   SECONDARY DIAGNOSIS:  1.  Possible history of pulmonary sarcoid, chest CT showing scarring and      enlarged lymph nodes, some calcified that are consistent with that      diagnosis.  2.  History of bradycardia status post Guidant Meridian permanent pacemaker      in 2000.  3.  Hypertension.  4.  Gastroesophageal reflux disease symptoms.  5.  Depression.  6.  Allergy or intolerance to STATIN and ZETIA.  7.  Status post back surgery x2, cholecystectomy and gland removed from my      neck.  8.  History of nephrolithiasis.  9.  Hyperlipidemia with a total cholesterol 245, triglycerides 204, HDL 27,      LDL 177.   HOSPITAL COURSE:  Cynthia [name] is a 77 year old female with known coronary  artery disease.  She developed chest pain associated with shortness of  breath that radiated into her neck and jaw.  The symptoms were exertional  and progressive.  She was admitted for further evaluation and treatment.  Her CK-MBs were negative for MI.  Her troponins bumped slightly peaking at  0.10 and a cardiac catheterization was felt indicated to further define her  anatomy.  Because of borderline renal insufficiency and a creatinine of 1.5,  she was treated with bicarb protocol.   The cardiac catheterization showed a  20% left main, 20% circumflex, and 30%  LAD.  The distal LAD stent was patent.  Her EF was 55%.  Dr. Gala Romney  recommended continued risk factor modification and a D-dimer to rule out PE.  The D-dimer was elevated at 1.05 so hydration was continued and she had a  chest CT performed on May 27, 2006.  The chest CT showed multiple enlarged  lymph nodes, some of them calcified.  The radiologist felt that she also had  possibly a calcified granuloma.  There was scarring present, as well.  There  was no PE or dissection.  When told that she had a possible history of  pulmonary sarcoid, he said the chest CT was consistent with this.   Dr. Diona Browner was advised of the results of the CT and felt that Dr. Reuel Boom,  the patient's primary care physician, could see the patient in follow up  with consideration for further pulmonary with Dr. Orson Aloe in [place].  She is  to be followed by  cardiology, as well.  Cynthia [name] was ambulating without  chest pain or shortness of breath and considered stable for discharge with  balance outpatient follow-up arranged.   DISCHARGE INSTRUCTIONS:  Her activity level is to be increased gradually.  She is to call our office for problems with the cath site.  She is to stick  to a low-fat diet.  She is to follow up with Dr. Diona Browner on June 11 at  12:45.  She is to follow up with Dr. Reuel Boom and call for appointment.   DISCHARGE MEDICATIONS:  1.  Trazodone 50 mg q.h.s.  2.  Zoloft 100 mg q.h.s.  3.  Prevacid 30 mg q.h.s.  4.  Lisinopril 10 mg q.h.s.  5.  Synthroid 75 mcg q.h.s.  6.  Aspirin 81 mg q.h.s.  7.  Multivitamin q.a.m.  8.  Toprol XL 25 mg daily.      Theodore Demark, P.A. [facility]      [name], M.D. [facility]  Electronically Signed    RB/MEDQ  D:  05/27/2006  T:  05/27/2006  Job:  [id number]   cc:   Donzetta Sprung  Fax: [phone]

## 2011-05-14 NOTE — Op Note (Signed)
Cynthia [name]. [facility]  Patient:    Cynthia [name], Cynthia [name]                         MRN: [id number] Proc. Date: 12/09/00 Adm. Date:  [id number] Attending:  Josie Saunders                           Operative Report  PREOPERATIVE DIAGNOSIS:  Spinal stenosis, degenerative disk disease, lumbar stenosis, and radiculopathy, L3 through S1 levels.  POSTOPERATIVE DIAGNOSIS:  Spinal stenosis, degenerative disk disease, lumbar stenosis, and radiculopathy, L3 through S1 levels.  PROCEDURE:  Decompressive lumbar laminectomy at L3 through S1.  SURGEON:  Danae Orleans. Venetia Maxon, M.D.  ASSISTANTMarland Kitchen  Mena Goes. Franky Macho, M.D.  ANESTHESIA:  General endotracheal anesthesia.  ESTIMATED BLOOD LOSS:  350 cc.  COMPLICATIONS:  None.  DISPOSITION:  To recovery.  INDICATION:  The patient is a 77 year old woman with severe spinal stenosis at L4-5 and to a lesser degree at the L3-4 level.  She has significant right lower extremity pain secondary to lumbar nerve root compression.  It was elected to take her to surgery for a decompressive lumbar laminectomy, L3 through S1 levels.  DESCRIPTION OF PROCEDURE:  Cynthia [name] was brought to the operating room. Following the satisfactory and uncomplicated induction of general endotracheal anesthesia and placement of an intravenous line, she was placed in a prone position on the Wilson frame, where her low back was then prepped and draped in the usual sterile fashion.  The area of the planned incision was infiltrated with 0.25% Marcaine, 0.5% lidocaine, and 1:200,000 epinephrine. An incision was made in the midline and carried through adipose tissue to the lumbodorsal fascia, which was incised with electrocautery.  Subperiosteal dissection was performed, exposing the L3, L4, L5, and sacral laminae.  Towel clips were placed on each of the spinous processes, and a probe was placed at the L5-S1 interspace, and intraoperative x-ray confirmed correct  orientation. Prior to x-ray, self-retaining retractor was placed to facilitate exposure. The spinous processes of L5 and L4 were completely removed, and the inferior two-thirds of L3 spinous process was removed.  Laminae were thinned with Leksell rongeurs.  The bone was quite soft.  Then using small, straight curettes, ligamentum flavum was detached from the undersurfaces of the laminae.  The spinal canal was then decompressed with Kerrison rongeurs from the sacral through the L3-4 level.  Lateral recesses were decompressed bilaterally.  There was significant ligamentous hypertrophy, particularly at the L4-5 level, and following decompression, the nerve roots were felt to e well-decompressed bilaterally.  Lateral recesses were decompressed of hypertrophied ligamentous tissue as well as osteophytic spurs.  Upon completion of decompression, the L3, L4, L5, and sacral nerve roots were well-decompressed bilaterally.  Hemostasis was then assured with bone wax and Gelfoam.  This was rigorously scrutinized, and there was found to be excellent hemostasis.  The wound was then irrigated with saline and the deep muscle layer was reapproximated.  The lumbodorsal fascia was then reapproximated with 0 Vicryl sutures.  The subcutaneous tissue was reapproximated with 2-0 Vicryl sutures, and then the skin edges were reapproximated with interrupted 3-0 Vicryl subcuticular stitch.  The wound was dressed with benzoin, Steri-Strips, Telfa gauze, and tape.  The patient was extubated in the operating room and taken to the recovery room in stable and satisfactory condition, having tolerated the operation well.  Counts were correct at the end of the  case. DD:  12/09/00 TD:  12/09/00 Job: [id number]

## 2011-05-14 NOTE — Op Note (Signed)
NAME:  Cynthia [name], Cynthia [name]                ACCOUNT NO.:  [id number]   MEDICAL RECORD NO.:  [id number]          PATIENT TYPE:  AMB   LOCATION:  DAY                           FACILITY:  [facility]   PHYSICIAN:  R. Roetta Sessions, M.D. DATE OF BIRTH:  Dec 27, 1931   DATE OF PROCEDURE:  03/16/2006  DATE OF DISCHARGE:                                 OPERATIVE REPORT   PROCEDURE:  Diagnostic esophagogastroduodenoscopy.   INDICATIONS FOR PROCEDURE:  The patient is a 77 year old lady with chronic  intermittent esophageal dysphagia and postprandial nausea. Performed a  barium pill esophagogram and upper GI series on February 15, 2006. She was  found to have some mild smooth narrowing of the cervical esophagus. The  barium pill traversed the esophagus without delay. There was some  questionable thickening on the gastric fold's greater curvature. Dr. Constance Goltz  called me and felt that she ought to have direct visualization. Prior EGD by  me back in 2004 demonstrated small hiatal hernia and otherwise normal upper  GI tract. A 56-French Maloney dilator was passed at that time, but she  states it really did not help her dysphagia. EGD is now being done to  further evaluate abnormal upper GI series. This approach has been discussed  with the patient at length. Potential risks, benefits, and alternatives have  been reviewed and questions answered. She is agreeable. Please see  documentation in the medical record.   PROCEDURE NOTE:  O2 saturation, blood pressure, pulse, and respirations were  monitored throughout the entire procedure. Conscious sedation with Versed 3  mg IV and Demerol 50 mg IV in divided doses. Cetacaine spray for topical  oropharyngeal anesthesia.   INSTRUMENT:  Olympus video chip system.   FINDINGS:  Examination of the tubular esophagus revealed no mucosal  abnormalities. EG junction was easily traversed.   Stomach:  Gastric cavity was empty and insufflated well with air. Thorough  examination  of gastric mucosa including retroflexed view of the proximal  stomach and esophagogastric junction demonstrated a small hiatal hernia and  a bulge extrinsic compression along the anterior gastric wall distal greater  curvature. Please see photos. This appeared to be extrinsic effect. The  gastric folds appeared entirely normal. There was no ___________ picture or  any other suspicious area. Pylorus patent and easily traversed. Examination  of bulb and second portion revealed no abnormalities.   THERAPEUTIC/DIAGNOSTIC MANEUVERS:  None.   The patient tolerated the procedure well and was reactive to endoscopy.   IMPRESSION:  Normal esophagus. Small hiatal hernia. Extrinsic compression on  the anterior wall distal greater curvature of uncertain significance.  Otherwise gastric mucosa appeared entirely normal. Patent pylorus. Normal D1  and D2.   RECOMMENDATIONS:  Will proceed with abdominal and pelvic CT with IV and oral  contrast to further evaluate the abnormality seen today. Further  recommendations to follow.      Jonathon Bellows, M.D.  Electronically Signed     RMR/MEDQ  D:  03/16/2006  T:  03/17/2006  Job:  [id number]   cc:   Donzetta Sprung  Fax: [phone]

## 2011-05-14 NOTE — Cardiovascular Report (Signed)
NAME:  Cynthia [name], Cynthia [name]                ACCOUNT NO.:  [id number]   MEDICAL RECORD NO.:  [id number]          PATIENT TYPE:  INP   LOCATION:  2001                         FACILITY:  [facility]   PHYSICIAN:  Arvilla Meres, M.D. LHCDATE OF BIRTH:  12/08/1931   DATE OF PROCEDURE:  DATE OF DISCHARGE:                              CARDIAC CATHETERIZATION   PRIMARY CARE PHYSICIAN:  Dr. Donzetta Sprung   CARDIOLOGIST:  Dr. Andee Lineman   Cynthia [name] is a 77 year old woman with a history of coronary disease status  post previous LAD stent.  She was admitted with chest pain and increasing  shortness of breath.  Troponin was weakly positive at 0.1.  EKG showed a  paced rhythm and MBs were normal.  Given her symptoms she was referred for a  diagnostic heart catheterization.   PROCEDURES PERFORMED:  1.  Selective coronary angiography.  2.  Left heart catheterization.  3.  Left ventriculogram.  4.  Angio-Seal closure device.   DESCRIPTION OF THE PROCEDURE:  The risks and benefits of the trial were  explained to Cynthia [name].  Consent was signed and placed on the chart.  Prior to the chart she was enrolled in the SEPIA trial.  A 5-French arterial  sheath was placed in the right femoral artery using a modified Seldinger  technique.  Standard catheters including a preformed Judkins JL4, JR4, and  an angled pigtail were used for procedure.  All catheter exchanges were made  over a wire.  There were no apparent complications.  At the end the  procedure patient's right femoral arteriotomy site was closed with an Angio-  Seal closure device with good hemostasis.   Central aortic pressure 122/62 with a mean of 85.  LV pressure 141/1 with an  EDP of 14.  There was no aortic stenosis.   Left main had a distal 20% stenosis.   LAD was a long vessel wrapping of the apex, gave off four small diagonals.  There was a 30% stenosis in the mid vessel.  There was a stent in the mid to  distal vessel which was widely  patent.   Left circumflex was a moderate sized vessel.  It gave off a long, but narrow  ramus and three marginals.  There was a 20% tubular stenosis in the proximal  left circumflex.   Right coronary artery was small but dominant vessel, gave off a small PDA  and two small PLs as well as a small RV branch.  It was angiographically  normal.   Left ventriculogram done in the RAO position showed an EF of 55% with  significant ventricular dyssynchrony.  There was no mitral regurgitation.   ASSESSMENT:  1.  Mild non-obstructive coronary disease as described above with a patent      left anterior descending stent.  2.  Normal left ventricular function with significant interventricular      dyssynchrony.   PLAN/DISCUSSION:  It does not appear that her symptoms are cardiac in  nature.  Given her mildly elevated troponin will check a D-dimer to rule out  pulmonary embolus.  She will need  continued aggressive risk factor  modification.      Arvilla Meres, M.D. [facility]  Electronically Signed     DB/MEDQ  D:  05/26/2006  T:  05/27/2006  Job:  [id number]   cc:   Donzetta Sprung  Fax: [phone]   Learta Codding, M.D. [facility]  [address]  [address]  [place]  [place] [address]

## 2011-05-14 NOTE — Discharge Summary (Signed)
NAME:  Cynthia [name], Cynthia [name]                          ACCOUNT NO.:  [id number]   MEDICAL RECORD NO.:  [id number]                   PATIENT TYPE:  OIB   LOCATION:  6529                                 FACILITY:  [facility]   PHYSICIAN:  [name], M.D. [facility]        DATE OF BIRTH:  05-31-1931   DATE OF ADMISSION:  02/21/2004  DATE OF DISCHARGE:  02/22/2004                           DISCHARGE SUMMARY - REFERRING   PROCEDURES:  Coronary angiogram/stent (Taxus). Left anterior descending  February 25.   REASON FOR ADMISSION:  Please refer to dictated admission note.   LABORATORY DATA:  Hemoglobin 11.5, hematocrit 37 (MCV 76) at discharge. WBC  4, platelet 160 on admission. Sodium 136, potassium 3.8, glucose 100, BUN  12, creatinine 1.2 at discharge, CPK 28/1.0.   Following initial presentation to [facility] for evaluation  of chest pain and dyspnea, patient was referred to Dr. Simona Huh for  further evaluation.  Serial cardiac markers were negative for myocardial  infarction, and patient was referred for an adenosine stress Cardiolite.  This revealed mild relative hypokinesis of the apex with possible apical  ischemia and preserved left ventricular function.  Following consultation  given this finding, recommendation was to proceed with definitive exclusive  of coronary artery disease, and arrangements were made for transfer to [facility].   Cardiac catheterization, performed by Dr. Daisey Must (see report for  full details), revealed  single-vessel coronary artery disease with an 80% distal LAD lesion and  otherwise nonobstructive coronary artery disease.  Left ventricular function  was normal with 1+, mild mitral regurgitation.   Dr. Gerri Spore proceeded with successful placement of a Taxus stent with  reduction of the 80% LAD lesion to 10% residual stenosis.  There were no  noted complications.   Patient was kept for overnight observation, and cleared  for discharge the  following morning in hemodynamically stable condition.  She had no complaint  of chest pain, and there were no complications of the right groin incision  site.   MEDICATION ADJUSTMENTS:  Patient will need to remain on Plavix for at least  6 months.   Of note, patient does have noted microcytic anemia and was instructed to  proceed for further evaluation with her primary care physician, Dr. Donzetta Sprung.  An iron profile will be checked prior to discharge, and patient  will be discharged with Hemoccult cards.   DISCHARGE MEDICATIONS:  Plavix 75 mg q.d., (times six months), coated  aspirin 81 mg q.d., Crestor 10 mg q.d., Synthroid 0.05 mg q.d., Prevacid 30  mg q.d., Zoloft 50 mg q.d., Combivent MDI 2 puffs b.i.d. p.r.n., Nitrostat  0.4 mg p.r.n.   INSTRUCTIONS:  No heavy lifting/driving x2 days; maintain low-  fat/cholesterol diet; call the office if there is any swelling/bleeding in  the groin.   Patient will follow up with Dr. Simona Huh at our [facility] in 2 weeks-  arrangements will be  made through our office.   Patient is also instructed to arrange followup with her primary care  physician, Dr. Donzetta Sprung, in approximately 2 weeks for a further  evaluation of anemia.   DISCHARGE DIAGNOSES:  1. Single vessel coronary artery disease.     A. Status post stent (Taxus) 80% distal left anterior descending February        25.     B. Residual nonobstructive coronary artery disease.     C. Normal left ventricular function.  (1+ mitral regurgitation).  2. Microcytic anemia.  3. Dyslipidemia.  4. History of high-grade atrioventricular block.     A. Status post permanent pacemaker implantation.  5. Hypothyroidism.  6. Gastroesophageal reflux disease.  7. Sarcoidosis.      Gene Serpe, P.A. [facility]                      [name], M.D. [facility]    GS/MEDQ  D:  02/22/2004  T:  02/22/2004  Job:  [id number]   cc:   Donzetta Sprung  [address]  [place]  [place] [address]  Fax: [phone]   _________ [facility]  [address]  [place], [place] [address]

## 2011-05-14 NOTE — Discharge Summary (Signed)
NAME:  [name], Cynthia                ACCOUNT NO.:  [id number]   MEDICAL RECORD NO.:  [id number]          PATIENT TYPE:  INP   LOCATION:  6711                         FACILITY:  [facility]   PHYSICIAN:  Learta Codding, M.D. LHCDATE OF BIRTH:  1931/01/13   DATE OF ADMISSION:  01/27/2005  DATE OF DISCHARGE:  01/28/2005                                 DISCHARGE SUMMARY   PROCEDURE:  1.  Cardiac catheterization.  2. Coronary arteriogram.  3. Left      ventriculogram.   HOSPITAL COURSE:  Cynthia [name] is a 77 year old female with a history of  coronary artery disease.  She had a Taxus stent to the LAD in 2/05.  In  11/05 she had recurrent chest pain and a Cardiolite study that was low risk.  She had the sudden onset of chest discomfort on the Sunday prior to  admission which was associated with shortness of breath, diaphoresis and  radiation to the left arm.  She came to the emergency room where  nitroglycerin offered some relief of her symptoms.  She was evaluated there  by Dr. DeGent and transferred to [facility] for further evaluation  and treatment.   Her cardiac enzymes were negative for MI.  Her laboratory values were  otherwise within normal limits except for a mild anemia.  It was felt that  cardiac catheterization was indicated to further define her anatomy and this  was performed on 01/28/05.   The cardiac catheterization showed less than 10% end-stent restenosis of the  LAD.  There was no other coronary artery disease.  Her EF was 65% with  marked dyssynchrony but no AS or MR.  Dr. Downey read the films and felt  that she had a widely patent LA stent and no significant stenosis otherwise.  He felt that her chest pain had a noncardiac etiology.   Dr. DeGent has evaluated Cynthia [name] and felt that if her cardiac  catheterization was negative she could be discharged to home to follow up  with primary care and cardiology.   Post catheterization Cynthia [name] feels a little weak.   Her vital signs are  stable and her groin is without oozing or hematoma.  She is to ambulate once  her bedrest is complete and if she does well she is tentatively considered  stable for discharge with outpatient followup arranged.   DISCHARGE DIAGNOSES:  1.  Chest pain, noncardiac etiology.  2. Status post PTCA and Taxus stent to      the LAD in 2/05 with no other significant coronary artery disease and      preserved left ventricular function.  3. Hypertension.  4.      Hyperlipidemia.  5. Family history of coronary artery disease.  6.      Borderline obesity.  7. Pulmonary sarcoidosis.  8. Hypothyroidism.  9.      History of C6 and C7 cervical myelopathy/spondylosis, status post      surgery.  10. History of depression.  11. History of nephrolithiasis.      12 . Status post Guidant Meridian dual chamber pacemaker,  DDDR mode      implanted in 9/00.   DISCHARGE INSTRUCTIONS:  Her activity level to include no driving, strenuous  or sexual activity for two days.  She is to stick to a low fat diet.  She is  to call our office for any problems with the catheterization site.  She can  remove the bandage in 24 hours and shower.   DISCHARGE MEDICATIONS:  1.  Aspirin 81 mg daily.  2. Plavix 75 mg daily.  3. Crestor 10 mg daily.      4. Prevacid 30 mg daily.  5. Zoloft 50 mg daily.  6. Imdur is on hold.      RB/MEDQ  D:  01/28/2005  T:  01/28/2005  Job:  [id number]   cc:   Donzetta Sprung  [address]  [place]  [place] [address]  Fax: [phone]

## 2011-05-14 NOTE — Cardiovascular Report (Signed)
NAME:  Cynthia [name], Cynthia [name]                ACCOUNT NO.:  [id number]   MEDICAL RECORD NO.:  [id number]          PATIENT TYPE:  INP   LOCATION:  3734                         FACILITY:  [facility]   PHYSICIAN:  Arturo Morton. Riley Kill, MD, FACCDATE OF BIRTH:  Jul 22, 1931   DATE OF PROCEDURE:  01/13/2007  DATE OF DISCHARGE:                            CARDIAC CATHETERIZATION   INDICATIONS:  Ms. Oliveira is a 77 year old who presents with chest pain.  She has had multiple subsequent catheterizations since having a stent  placed in her mid-left anterior descending artery.  The current study  was done to assess coronary anatomy.   PROCEDURE:  The patient was brought to the catheterization laboratory,  prepped and draped in usual fashion.  She was on a bicarb drip because  of mild elevation in creatinine.  Through an anterior puncture the  femoral artery was entered and a 5-French sheath was placed.  Views of  the left and right coronary arteries were then obtained.  Central aortic  and left ventricular pressures were measured with a pigtail.  We did not  perform ventriculography to limit contrast load.  Contrast load was  nicely limited to 35 mL.   ANGIOGRAPHIC DATA:  1. The left main appeared to be free of critical disease.  2. The left anterior descending artery courses to the apex.  In the      mid LAD is a previously placed stent.  There is about 30% narrowing      at the takeoff of the first diagonal.  The stent itself appears to      be widely patent, there does not appear to be significant narrowing      of the stent.  It does overlap a small diagonal which may have some      very mild ostial pinching.  The remainder of the diagonals may have      mild luminal irregularity but no significant focal obstruction.  3. The circumflex is a large-caliber vessel.  There is a tiny first      marginal that has about 70% ostial narrowing; second, third and      fourth marginals are all free of critical disease.  4. The right coronary artery is a moderate-sized vessel.  There is      some calcification proximally.  It provides a single PDA and a      smaller posterolateral system both of which appear free of critical      disease.   The patient has had a prior CT scan and did not have pulmonary emboli.  We have done venous Dopplers to exclude DVT because of some mild lower  extremity swelling which is new.  Her angiogram does not suggest  progression of disease and the stent itself is widely patent.  Continued  medical therapy is warranted.  Unfortunately the patient has severe  hypercholesterolemia and is intolerant to lipid lowering agents.      Arturo Morton. Riley Kill, MD, [facility]  Electronically Signed     TDS/MEDQ  D:  01/13/2007  T:  01/13/2007  Job:  [id number]   cc:  Noah Charon, MD,FACC  CV Lab

## 2011-05-14 NOTE — Cardiovascular Report (Signed)
NAME:  Forehand, Di                ACCOUNT NO.:  [id number]   MEDICAL RECORD NO.:  [id number]          PATIENT TYPE:  INP   LOCATION:                               FACILITY:  [facility]   PHYSICIAN:  Salvadore Farber, M.D. LHCDATE OF BIRTH:  1931/09/26   DATE OF PROCEDURE:  01/28/2005  DATE OF DISCHARGE:  01/28/2005                              CARDIAC CATHETERIZATION   PROCEDURE:  Left heart catheterization, left ventriculography, coronary  angiography.   INDICATIONS:  Ms. Inglis is a 77 year old lady status post stenting of her  LAD in February 2005. She has generally done well since, but now presents  with some chest discomfort occurring both at rest and at exertion during a  period of increased emotional stress. She was admitted to hospital and ruled  out for myocardial infarction. She was then referred for diagnostic  angiography to exclude new coronary stenosis.   PROCEDURAL TECHNIQUE:  Informed consent was obtained. Under 1% lidocaine  local anesthesia, a 5-French sheath placed in the right common femoral  artery using the modified Seldinger technique. Diagnostic angiography and  ventriculography were performed using JL4, JR4, and pigtail catheters. The  arteriotomy was then closed using a StarClose device. Complete hemostasis  was obtained. The patient was then transferred to the holding room in stable  condition having tolerated procedure well.   COMPLICATIONS:  None.   FINDINGS:  1.  LV: 168/15/18. EF 65% with marked dyssynchrony of the left ventricle      which appears to be due to the pacemaker.  2.  No aortic stenosis or mitral regurgitation.  3.  Left main: Angiographically normal.  4.  LAD: Large vessel wrapping the apex of the heart and giving rise to      three small diagonal branches. There is a stent in mid LAD which has      approximately 10% in-stent restenosis. No other significant disease.  5.  Circumflex: Moderate-sized vessel giving rise to three obtuse  marginals.      It is angiographically normal.  6.  RCA: Small, though technically dominant vessel. It is angiographically      normal.   IMPRESSION/RECOMMENDATION:  Ms. Caban has a widely patent left anterior  descending stent and no other significant stenoses in the coronary arteries.  I suspect a noncardiac etiology to her chest discomfort. Patient will follow  up with Dr. Reuel Boom and Dr. Andee Lineman.      WED/MEDQ  D:  01/28/2005  T:  01/28/2005  Job:  [id number]   cc:   Learta Codding, M.D. [facility]  [address], [address]  [place]  [place] [address]  Fax: [phone]

## 2011-05-14 NOTE — Discharge Summary (Signed)
NAME:  Cynthia [name], Cynthia [name]                ACCOUNT NO.:  [id number]   MEDICAL RECORD NO.:  [id number]          PATIENT TYPE:  INP   LOCATION:  3734                         FACILITY:  [facility]   PHYSICIAN:  Madolyn Frieze. Jens Som, MD, FACCDATE OF BIRTH:  11-03-1931   DATE OF ADMISSION:  01/12/2007  DATE OF DISCHARGE:  01/14/2007                               DISCHARGE SUMMARY   Primary care physician is Donzetta Sprung, MD.  Primary cardiologist is Learta Codding, MD, at our [facility] office.   REASON FOR ADMISSION:  Chest pain.   DISCHARGE DIAGNOSES:  1. Chest pain, etiology unclear.      a.     Patent stent by catheterization this admission.      b.     V/Q scan pending at the time of this dictation.  2. Coronary artery disease.      a.     Status post Taxus stenting to left anterior descending       artery in February 2005.  3. History of high-grade heart block, status post permanent pacemaker      implantation with a Guidant Meridian device in 2000.  4. History of abnormal chest CT with lymphadenopathy with questionable      calcified granuloma, presumed to be sarcoidosis.      a.     Follow-up chest CT pending.  5. Hypertension.  6. Dyslipidemia.  7. Glucose intolerance.  8. History of left upper parathyroidectomy for hyperparathyroidism.  9. Osteoporosis.  10.Chronic renal insufficiency with a creatinine of 1.4.  11.Gastroesophageal reflux disease.  12.Severe obstructive sleep apnea, noncompliant with CPAP.   PROCEDURE PERFORMED THIS ADMISSION:  Cardiac catheterization by Dr.  Shawnie Pons.  Please see his dictated note for complete details.  Briefly, the mid LAD stent is widely patent.  There was a 30% stenosis  at the takeoff of the first diagonal.  The circumflex had a tiny first  marginal with 70% ostial narrowing.  The RCA had some proximal  calcification.   HISTORY:  Cynthia [name] is a 77 year old female patient who was seen in  consultation on January 12, 2007, by Dr. Andee Lineman at  [facility].  The patient was admitted with episodes of substernal chest  pain described as a crushing sensation associated with diaphoresis and  significant shortness of breath.  She was admitted for further  evaluation and treatment.  Dr. Andee Lineman discussed proceeding with stress  testing versus cardiac catheterization.  It was eventually decided to go  ahead and proceed with cardiac catheterization, and the patient was  transferred to [facility] for further evaluation and treatment.  The patient's cardiac enzymes were noted be elevated with troponin of  0.1 and 0.9.   HOSPITAL COURSE:  The patient was accepted in transfer by Dr. Riley Kill.  The patient underwent cardiac catheterization, which revealed patent  stent in the LAD.  The D-dimer was noted to be elevated at 0.9.  Due to  the patient's renal insufficiency and the recent contrast load from  cardiac catheterization, it was decided to proceed with lower extremity  Dopplers and  V/Q scan to rule out  pulmonary embolism.  The patient was  noted to have abnormal chest CT from this past summer.  She will also  need follow-up chest CT as an outpatient without contrast.  The  preliminary report of the bilateral extremity Dopplers is negative for  DVT.  The V/Q scan is still pending at the time of this dictation.  As  long as her V/Q scan is negative for pulmonary embolus, she will be  discharged to home later today in stable condition.  was high 34 q.a.c.  readmitted of near history please note, please add to the cystadenoma  lying at the end where left off under history the patient's cardiac  enzymes were noted be elevated with troponin of 0.1 and 0.099   LABORATORY AND ANCILLARY DATA:  White count 4000, hemoglobin 12.7,  hematocrit 37.1, platelet count 130,000.  D-dimer was noted above 0.9.  Sodium 139, potassium 4.2, glucose 102, BUN 12, creatinine 1.19, calcium  8.4.  CK-MB 1.1, troponin I 0.12.  Total  cholesterol 313, triglycerides  251, HDL 27, LDL 236.   DISCHARGE MEDICATIONS:  1. Aspirin 81 mg daily.  2. Prevacid 30 mg daily.  3. Toprol XL 25 mg daily.  4. Synthroid 0.075 mg daily.  5. Lisinopril 10 mg daily.  6. Zoloft 50 mg daily.  7. Zocor 80 mg q.h.s.  8. Ultram 50 mg q.8h. p.r.n. to nurse.   DIET:  Low-fat, low-sodium prediabetic diet.   ACTIVITY:  She is to increase activity slowly.  She may shower.  She may  walk up steps.  No driving, heavy lifting or sexual activity for 3 days.   WOUND CARE:  She should call our [facility] office for any groin swelling,  bleeding, bruising or fever.   FOLLOW-UP:  With Dr. Andee Lineman in [place] in the next 2-4 weeks.  The office  will contact her with that appointment.  She will also need follow-up  chest CT to follow up on her abnormal chest CT from this past summer.  She will need follow-up lipids and LFTs in about 6-8 weeks to recheck  her lipid panel as she is now on high-dose Zocor.   As noted above, V/Q scan is still pending at the time of this dictation.  As long as this is negative for pulmonary embolus, she will be  discharged home later today.   TOTAL PHYSICIAN AND PA TIME:  Greater than 30 minutes.      Tereso Newcomer, PA-C      Madolyn Frieze. Jens Som, MD, [facility]  Electronically Signed    SW/MEDQ  D:  01/14/2007  T:  01/14/2007  Job:  [id number]   cc:   Noah Charon, MD,FACC

## 2011-05-14 NOTE — H&P (Signed)
[place]. [facility]  Patient:    Cynthia [name], Cynthia [name]                         MRN: [id number] Adm. Date:  [id number] Attending:  Josie Saunders                         History and Physical  REASON FOR ADMISSION:  Lumbar spinal stenosis.  HISTORY OF PRESENT ILLNESS:  Cynthia [name] is a 77 year old woman on whom I had previously performed anterior cervical diskectomy and fusion at C6-7 level and who subsequently required placement of pacemaker during that hospitalization who did well following that surgery, which was performed on September 03, 1999. She returned to see me on January 13, 2000, and said she was doing well from the standpoint of her neck, although, she did have some occasional difficulty swallowing, but did not feel this was terribly severe.  She had full strength in her upper extremities.  Her neck was not bothering her.  She did not feel that her pacemaker had helped her energy level.  She had noticed some significant increased pain in her right lower extremity and her low back and says that since she last saw me, this is a lot worse than it was previously. Unfortunately, she did not get an MRI, so plan was for her to get a CT scan. She also says she is getting some help for her depression and feels that this is improving and overall her spirits have been better.  She is smiling and laughing during the interview.  She is recently divorced and was having a lot of adjustment issues related to that.  On examination at that point, Ms. Buckels had full strength in her upper extremities and no significant tenderness in her back.  She had marked right sciatic notch discomfort and right paraspinous muscular discomfort to palpation.  She had 4/5 extensor hallucis longus strength on the right, otherwise full strength in all motor groups bilaterally symmetric.  She did have some mild gluteus weakness on the right.  Reflexes are symmetric at the knees and  ankles at 3.  Great toes are downgoing to plantar stimulation.  She notes numbness in the L5 distribution on the right.  Straight leg raising is markedly positive on the right and negative on the left.  Patricks test is negative.  She has no tenderness over her greater trochanter on the right or the left.  I recommended to Ms. Stump that she undergo a lumbar CT scan to better clarify if she has any significant nerve root compression.  She is unable to get an MRI because of her pacemaker.  Ms. Winters returned to see me on February 2, to review the CT scan of her lumbar spine.  She was continuing to have significant right leg pain.  I reviewed her CT of the lumbar spine.  She was unable to obtain an MRI because of her pacemaker.  She has small central herniated disk at L2-3, diffuse disk bulge at L3-4 with broad-based herniation at L4-5 with lateral recess stenosis left greater than right causing bilateral L5 nerve root compression and significant spinal stenosis at this level.  The patient subsequently attempted conservative management.  She had epidural steroid injection.  She said this gave her some relief, but was not long-lasting.  She was complaining of severe right leg pain and extensor hallucis longus weakness on examination with  positive straight leg raising on confrontational testing when I saw her in the office on October 12, 2000.  She noted some numbness on the top of her foot.  I felt that a lumbar myelogram would be needed prior to embarking on surgical intervention and this was performed.  She then returned to see me on October 24, 2000, after undergoing lumbar myelography and postmyelographic CT which demonstrated marked spinal stenosis at the L4-5 level and to a slightly lesser degree the L3-4 level with biforaminal stenosis.  She has central to left-sided disk herniated of the L2-3 level which is causing some mild stenosis.  Ms. Palencia continued to complain of severe  pain in both lower extremities right greater than the left.  I have recommended based on the severity of the spinal stenosis and significant duration and severity of her pain that she undergo a decompressive laminectomy which would be performed L3-4 through L4-5 levels.  I recommended that she be evaluated by Dr. Ladona Ridgel who put in her pacemaker when she was hospitalized to clear her for surgical intervention and following being cleared to go ahead and set up decompressive lumbar laminectomy for stenosis.  Risks of surgery were discussed in detail and include, but are not limited to the risks of anesthesia, blood loss, possibility of hemorrhage, infection, damage to nerves, damage to blood vessels, injury to lumbar nerve root causing either temporary or permanent leg pain, numbness, or weakness.  There is potential for spinal fluid leak from dural tear.  There is a possibility of postlaminectomy spondylolisthesis, recurrent disk herniation quoted at approximately 10%, failure to relieve pain, worsening pain, and need for further surgery.  She wishes to go ahead with surgery and this is set up for December 09, 2000. DD:  12/09/00 TD:  12/09/00 Job: [id number]

## 2011-05-14 NOTE — Discharge Summary (Signed)
[place]. [facility]  Patient:    Cynthia [name], Cynthia [name]                         MRN: [id number] Adm. Date:  [id number] Disc. Date: 12/14/00 Attending:  Josie Saunders Dictator:   Danae Orleans. Venetia Maxon, M.D.                           Discharge Summary  REASON FOR ADMISSION:  Lumbar spinal stenosis and lumbar radiculopathy.  HISTORY OF ILLNESS:  The patient is a 77 year old woman with lumbar spinal stenosis with a history of heart block and pacemaker, who had previously undergone anterior cervical diskectomy and fusion for herniated cervical disk.  HOSPITAL COURSE:  She was admitted on the same-day-as-procedure basis on December 09, 2000, and underwent lumbar decompressive laminectomy, L3 through S1 levels, for spinal stenosis.  She did well following the surgery, had some incisional pain, was slow to mobilize, did not have any family to go home to and wished to be transferred to [facility] for rehabilitation and postoperative recovery and this was arranged for on December 14, 2000.  DISCHARGE MEDICATIONS: 1. Percocet one to two every four hours as needed for pain. 2. Valium 5 mg every six hours as needed for spasm.  FOLLOWUP:  In three weeks with Dr. Venetia Maxon. DD:  12/14/00 TD:  12/14/00 Job: [id number]

## 2011-08-20 ENCOUNTER — Encounter: Payer: Self-pay | Admitting: Internal Medicine

## 2011-08-20 ENCOUNTER — Ambulatory Visit (INDEPENDENT_AMBULATORY_CARE_PROVIDER_SITE_OTHER): Payer: Medicare Other | Admitting: *Deleted

## 2011-08-20 DIAGNOSIS — I442 Atrioventricular block, complete: Secondary | ICD-10-CM

## 2011-08-20 LAB — PACEMAKER DEVICE OBSERVATION
AL AMPLITUDE: 5.6 mv
AL THRESHOLD: 0.5 V
BAMS-0001: 175 {beats}/min
VENTRICULAR PACING PM: 100

## 2011-08-20 NOTE — Progress Notes (Signed)
Pacer check in clinic  

## 2011-11-29 ENCOUNTER — Encounter: Payer: Self-pay | Admitting: Cardiology

## 2011-12-08 ENCOUNTER — Ambulatory Visit (INDEPENDENT_AMBULATORY_CARE_PROVIDER_SITE_OTHER): Payer: Medicare Other | Admitting: Cardiology

## 2011-12-08 ENCOUNTER — Encounter: Payer: Self-pay | Admitting: Cardiology

## 2011-12-08 DIAGNOSIS — I442 Atrioventricular block, complete: Secondary | ICD-10-CM

## 2011-12-08 DIAGNOSIS — E785 Hyperlipidemia, unspecified: Secondary | ICD-10-CM

## 2011-12-08 DIAGNOSIS — I10 Essential (primary) hypertension: Secondary | ICD-10-CM

## 2011-12-08 DIAGNOSIS — I2510 Atherosclerotic heart disease of native coronary artery without angina pectoris: Secondary | ICD-10-CM

## 2011-12-08 NOTE — Assessment & Plan Note (Signed)
Symptomatically stable on current medical therapy. ECG reviewed. Continue observation. Had reassuring ischemic workup one year ago.

## 2011-12-08 NOTE — Patient Instructions (Signed)
Your physician wants you to follow-up in: 6 months. You will receive a reminder letter in the mail one-two months in advance. If you don't receive a letter, please call our office to schedule the follow-up appointment. Your physician recommends that you continue on your current medications as directed. Please refer to the Current Medication list given to you today. 

## 2011-12-08 NOTE — Assessment & Plan Note (Signed)
Blood pressure control is reasonable. 

## 2011-12-08 NOTE — Assessment & Plan Note (Signed)
Status post pacemaker, continue followup as arranged.

## 2011-12-08 NOTE — Progress Notes (Signed)
Clinical Summary Cynthia [name] is a 77 y.o.female presenting for followup. She was seen in January.  She missed her last scheduled visit with me, although did have a pacer check in August. She states that she has been feeling fairly well, no chest pain or progressive shortness of breath. She has gained weight, admits that she is not exercising. We talked about a walking regimen.  No reported difficulties with her pacemaker. Occasionally has palpitations that she feels in her neck, very infrequent. Otherwise reports compliance with her medications.  She continues to follow with Dr. Reuel Boom. Has had significant intolerances to statin therapy, and now takes only omega-3 supplements over-the-counter.  Cardiolite from December of last year showed no ischemia with normal LVEF.   No Known Allergies  Medication list reviewed.  Past Medical History  Diagnosis Date  . Complete heart block   . Sarcoidosis   . Chronic diastolic heart failure   . Coronary atherosclerosis of native coronary artery     DES to LAD 2/05, normal LVEF  . Arthritis   . Depression   . Hyperlipidemia   . Hypothyroidism     Past Surgical History  Procedure Date  . Anterior cervical discectomy     Cervical fusion  . Lumbar laminectomy     L3 S1  . Pacemaker insertion     Medtronic    Social History Cynthia [name] reports that she has never smoked. She has never used smokeless tobacco. Cynthia [name] reports that she does not drink alcohol.  Review of Systems Otherwise negative.  Physical Examination Filed Vitals:   12/08/11 1128  BP: 128/81  Pulse: 71    Normally nourished appearing woman in no acute distress.  HEENT: Conjunctiva and lids normal, oropharynx moist mucosa.  Neck: Supple, no carotid bruits, no thyromegaly.  Lungs: Clear to auscultation, nonlabored.  Cardiac: Regular rate and rhythm.  Thorax: Stable device pocket site.  Extremities: No pitting edema.     ECG Reviewed in EMR.   Problem  List and Plan

## 2011-12-08 NOTE — Assessment & Plan Note (Signed)
Has significant statin intolerance. Continue follow up with Dr. Reuel Boom on omega-3 supplements.

## 2012-01-02 IMAGING — CR DG CHEST 1V PORT
1 series · 1 of 1 positions shown · non-contrast
Comparison: 01/14/2007

CLINICAL DATA: Chest pain

PORTABLE CHEST - 1 VIEW

[view not recorded]
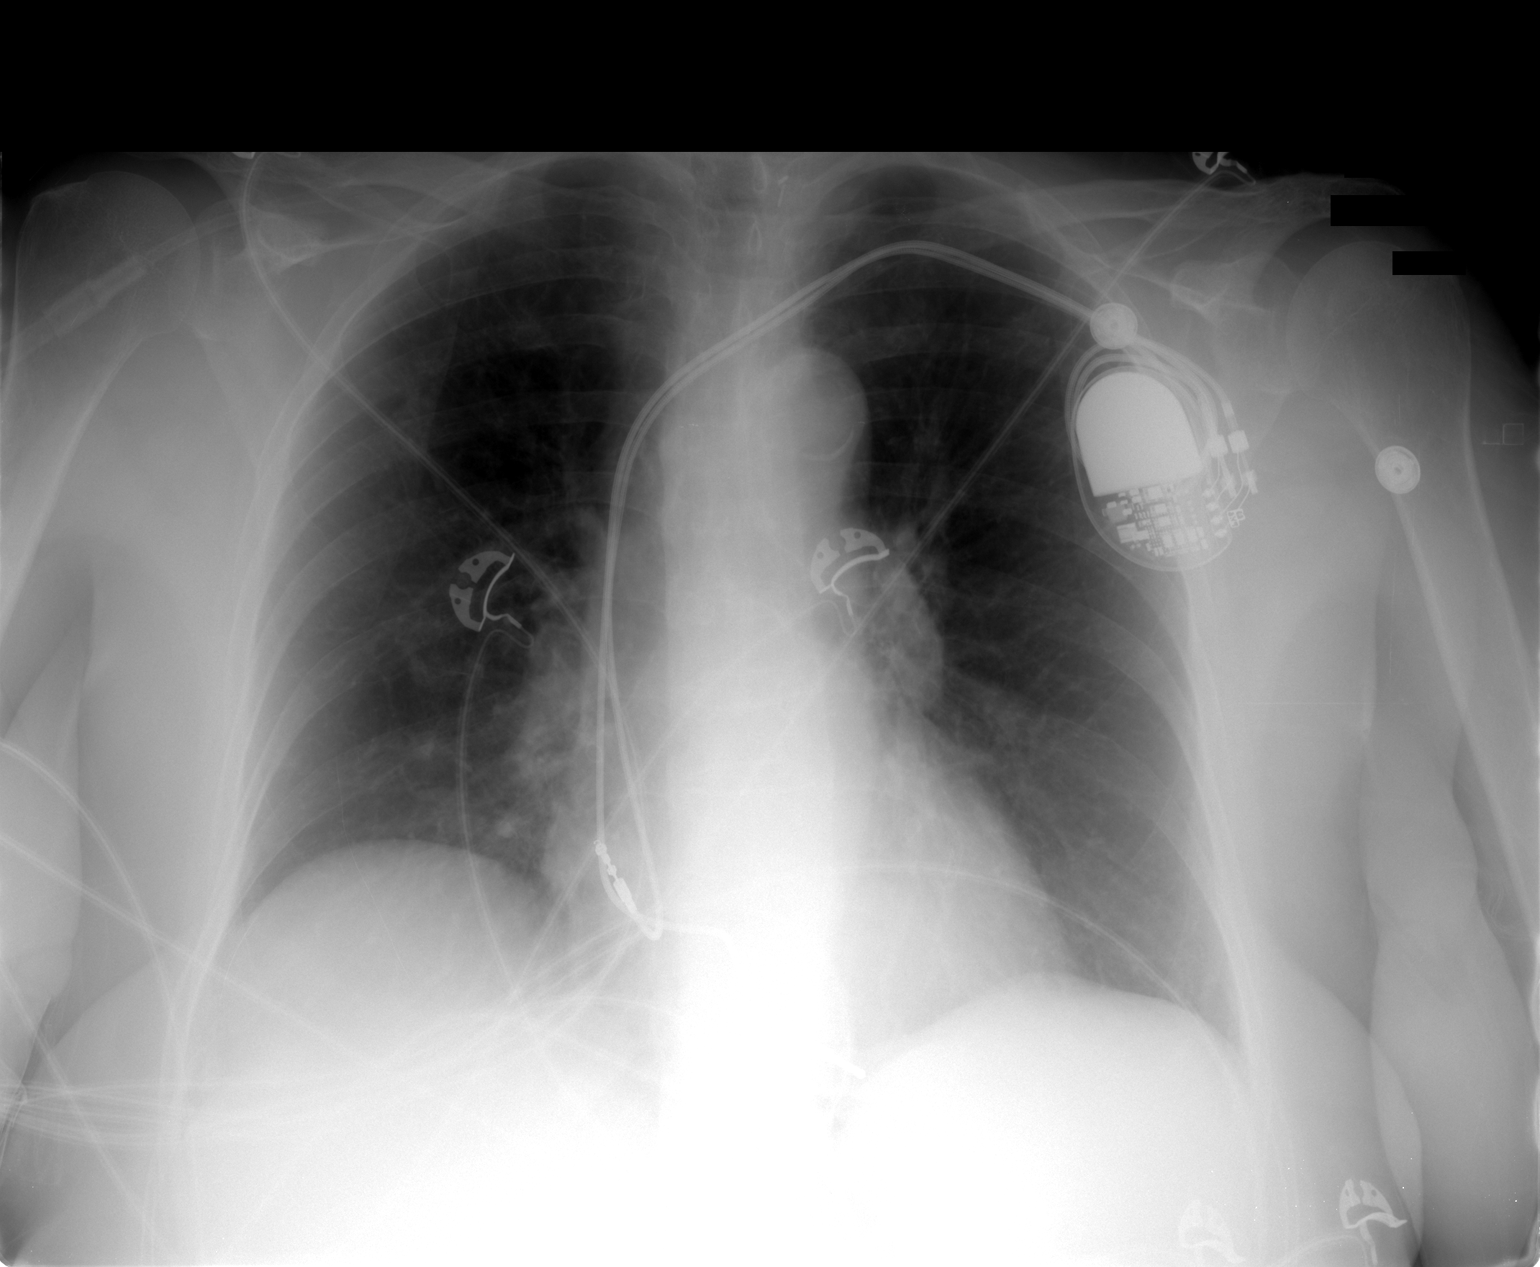

[1 of 1 positions shown; findings below may reference images not displayed]

FINDINGS: Heart size is normal.  Negative for heart failure.  Dual
lead pacemaker is unchanged.  Lungs are clear.
IMPRESSION: No active cardiopulmonary disease.

## 2012-02-04 ENCOUNTER — Ambulatory Visit (INDEPENDENT_AMBULATORY_CARE_PROVIDER_SITE_OTHER): Payer: Medicaid Other | Admitting: Internal Medicine

## 2012-02-04 ENCOUNTER — Encounter: Payer: Self-pay | Admitting: Internal Medicine

## 2012-02-04 VITALS — BP 130/82 | HR 87 | Ht 65.0 in | Wt 166.0 lb

## 2012-02-04 DIAGNOSIS — I442 Atrioventricular block, complete: Secondary | ICD-10-CM

## 2012-02-04 DIAGNOSIS — I10 Essential (primary) hypertension: Secondary | ICD-10-CM

## 2012-02-04 DIAGNOSIS — R1310 Dysphagia, unspecified: Secondary | ICD-10-CM

## 2012-02-04 LAB — PACEMAKER DEVICE OBSERVATION
AL AMPLITUDE: 4 mv
AL AMPLITUDE: 5.6 mv
AL IMPEDENCE PM: 446 Ohm
BATTERY VOLTAGE: 2.79 V
RV LEAD IMPEDENCE PM: 1068 Ohm

## 2012-02-04 NOTE — Progress Notes (Signed)
PCP:  Donzetta Sprung, MD, MD  The patient presents today for routine electrophysiology followup.  Since last being seen in our clinic, the patient reports doing reasonably well.  She does not feel well resting and is tired throughout the day.  Unfortunately, she has not been able to tolerate CPAP. Today, she denies symptoms of palpitations, chest pain, shortness of breath (above baseline), lower extremity edema, dizziness, presyncope, syncope, or neurologic sequela.  The patient feels that she is tolerating medications without difficulties and is otherwise without complaint today.   Past Medical History  Diagnosis Date  . Complete heart block     s/p PPM  . Sarcoidosis   . Chronic diastolic heart failure   . Coronary atherosclerosis of native coronary artery     DES to LAD 2/05, normal LVEF  . Arthritis   . Depression   . Hyperlipidemia   . Hypothyroidism    Past Surgical History  Procedure Date  . Anterior cervical discectomy     Cervical fusion  . Lumbar laminectomy     L3 S1  . Pacemaker insertion     Medtronic    Current Outpatient Prescriptions  Medication Sig Dispense Refill  . amLODipine (NORVASC) 10 MG tablet Take 10 mg by mouth daily.        Marland Kitchen aspirin 81 MG tablet Take 81 mg by mouth daily.        . hydrochlorothiazide (HYDRODIURIL) 25 MG tablet Take 12.5 mg by mouth daily.      . lansoprazole (PREVACID) 30 MG capsule Take 30 mg by mouth daily.        Marland Kitchen levothyroxine (SYNTHROID, LEVOTHROID) 88 MCG tablet Take 88 mcg by mouth daily.        . sertraline (ZOLOFT) 100 MG tablet Take 50 mg by mouth daily.          No Known Allergies  History   Social History  . Marital Status: Divorced    Spouse Name: N/A    Number of Children: N/A  . Years of Education: N/A   Occupational History  . Not on file.   Social History Main Topics  . Smoking status: Never Smoker   . Smokeless tobacco: Never Used  . Alcohol Use: No  . Drug Use: No  . Sexually Active: Not on file    Other Topics Concern  . Not on file   Social History Narrative  . No narrative on file    Physical Exam: Filed Vitals:   02/04/12 0926  BP: 130/82  Pulse: 87  Height: 5\' 5"  (1.651 m)  Weight: 166 lb (75.297 kg)  SpO2: 98%    GEN- The patient is well appearing, alert and oriented x 3 today.   Head- normocephalic, atraumatic Eyes-  Sclera clear, conjunctiva pink Ears- hearing intact Oropharynx- clear Neck- supple, no JVP Lymph- no cervical lymphadenopathy Lungs- Clear to ausculation bilaterally, normal work of breathing Chest- pacemaker pocket is well healed Heart- Regular rate and rhythm, no murmurs, rubs or gallops, PMI not laterally displaced GI- soft, NT, ND, + BS Extremities- no clubbing, cyanosis, or edema  Pacemaker interrogation- reviewed in detail today,  See PACEART report  Assessment and Plan:

## 2012-02-04 NOTE — Assessment & Plan Note (Signed)
Normal pacemaker function See Pace Art report No changes today  

## 2012-02-04 NOTE — Patient Instructions (Signed)
Remote monitoring is used to monitor your Pacemaker of ICD from home. This monitoring reduces the number of office visits required to check your device to one time per year. It allows [place] to keep an eye on the functioning of your device to ensure it is working properly. You are scheduled for a device check from home on May 04, 2012. You may send your transmission at any time that day. If you have a wireless device, the transmission will be sent automatically. After your physician reviews your transmission, you will receive a postcard with your next transmission date.  Your physician wants you to follow-up in: 12 months with Dr Johney Frame.  You will receive a reminder letter in the mail two months in advance. If you don't receive a letter, please call our office to schedule the follow-up appointment.

## 2012-02-04 NOTE — Assessment & Plan Note (Signed)
Stable No change required today  

## 2012-02-23 ENCOUNTER — Encounter: Payer: Self-pay | Admitting: Gastroenterology

## 2012-02-23 ENCOUNTER — Ambulatory Visit (INDEPENDENT_AMBULATORY_CARE_PROVIDER_SITE_OTHER): Payer: Medicaid Other | Admitting: Gastroenterology

## 2012-02-23 VITALS — BP 124/70 | HR 75 | Ht 64.0 in | Wt 168.4 lb

## 2012-02-23 DIAGNOSIS — K219 Gastro-esophageal reflux disease without esophagitis: Secondary | ICD-10-CM

## 2012-02-23 DIAGNOSIS — R1319 Other dysphagia: Secondary | ICD-10-CM

## 2012-02-23 NOTE — Progress Notes (Signed)
History of Present Illness: This is a 78 year old female who relates a one year history of worsening solid food dysphagia. She has chronic GERD which is well controlled on daily Prevacid. She notes frequent postprandial bloating and epigastric discomfort. She states she has had 2 esophageal dilations performed in the past: one in [place] many years ago and one by Dr. Roetta Sessions in [place] around 2007 or 2008. Denies weight loss, abdominal pain, constipation, diarrhea, change in stool caliber, melena, hematochezia, nausea, vomiting, reflux symptoms, chest pain.  Review of Systems: Pertinent positive and negative review of systems were noted in the above HPI section. All other review of systems were otherwise negative.  Current Medications, Allergies, Past Medical History, Past Surgical History, Family History and Social History were reviewed in [employer] record.  Physical Exam: General: Well developed , well nourished, no acute distress Head: Normocephalic and atraumatic Eyes:  sclerae anicteric, EOMI Ears: Normal auditory acuity Mouth: No deformity or lesions Neck: Supple, no masses or thyromegaly Lungs: Clear throughout to auscultation Heart: Regular rate and rhythm; no murmurs, rubs or bruits, pacemaker Abdomen: Soft, non tender and non distended. No masses, hepatosplenomegaly or hernias noted. Normal Bowel sounds Musculoskeletal: Symmetrical with no gross deformities  Skin: No lesions on visible extremities Pulses:  Normal pulses noted Extremities: No clubbing, cyanosis, edema or deformities noted Neurological: Alert oriented x 4, grossly nonfocal Cervical Nodes:  No significant cervical adenopathy Inguinal Nodes: No significant inguinal adenopathy Psychological:  Alert and cooperative. Normal mood and affect  Assessment and Recommendations:  1. Dysphagia and chronic GERD. I suspect she has a recurrent peptic stricture. Rule out an esophageal  neoplasm and other disorders. Continue Prevacid 30 mg daily and standard antireflux measures. The risks, benefits, and alternatives to endoscopy with possible biopsy and possible dilation were discussed with the patient and they consent to proceed.   2. Colorectal cancer screening, average risk. Patient states she had a colonoscopy performed previously but does not recall the date will attempt to determine her colonoscopy recall date after reviewing the records from Dr. Jena Gauss.

## 2012-02-23 NOTE — Patient Instructions (Signed)
You have been scheduled for a Upper Endoscopy with propofol. See separate instructions.  cc: Donzetta Sprung, MD

## 2012-02-29 ENCOUNTER — Encounter: Payer: Self-pay | Admitting: Gastroenterology

## 2012-02-29 ENCOUNTER — Ambulatory Visit (AMBULATORY_SURGERY_CENTER): Payer: Medicare Other | Admitting: Gastroenterology

## 2012-02-29 DIAGNOSIS — K219 Gastro-esophageal reflux disease without esophagitis: Secondary | ICD-10-CM

## 2012-02-29 DIAGNOSIS — R1319 Other dysphagia: Secondary | ICD-10-CM

## 2012-02-29 MED ORDER — SODIUM CHLORIDE 0.9 % IV SOLN: 500.0000 mL | INTRAVENOUS | Status: DC

## 2012-02-29 NOTE — Op Note (Signed)
[facility] [address] [place], [place]  [address]  ENDOSCOPY PROCEDURE REPORT  PATIENT:  Cynthia [name], Cynthia [name]  MR#:  [id number] BIRTHDATE:  1931/07/24, 80 yrs. old  GENDER:  female ENDOSCOPIST:  Judie Petit T. Russella Dar, MD, [facility] Referred by:  Donzetta Sprung, M.D. PROCEDURE DATE:  02/29/2012 PROCEDURE:  EGD with dilatation over guidewire ASA CLASS:  Class III INDICATIONS:  dysphagia, GERD MEDICATIONS:  MAC sedation, administered by CRNA, propofol (Diprivan) 60 mg IV TOPICAL ANESTHETIC:  none DESCRIPTION OF PROCEDURE:   After the risks benefits and alternatives of the procedure were thoroughly explained, informed consent was obtained.  The LB GIF-H180 [id number] endoscope was introduced through the mouth and advanced to the second portion of the duodenum, without limitations.  The instrument was slowly withdrawn as the mucosa was fully examined. <<PROCEDUREIMAGES>> The esophagus and gastroesophageal junction were completely normal in appearance.  The stomach was entered and closely examined. The pylorus, antrum, angularis, and lesser curvature were well visualized, including a retroflexed view of the cardia and fundus. The stomach wall was normally distensable. The scope passed easily through the pylorus into the duodenum. The duodenal bulb was normal in appearance, as was the postbulbar duodenum.  The esophagus and gastroesophageal junction were completely normal in appearance. savary / guidewire 17mm with no resistance and no heme. Retroflexed views revealed a hiatal hernia, small.  The scope was then withdrawn from the patient and the procedure completed.  COMPLICATIONS:  None  ENDOSCOPIC IMPRESSION: 1) Small hiatal hernia  RECOMMENDATIONS: 1) Anti-reflux regimen long term 2) PPI qam long term 3) Post dilation instructions 4) Call office to schedule an office appointment if dysphagia does not resolve or returns  Marsden Zaino T. Russella Dar, MD, [facility] Graham  n. eSIGNED:   Venita Lick. Virgilene Stryker  at 02/29/2012 03:25 PM  [place], [place], [phone]

## 2012-02-29 NOTE — Patient Instructions (Signed)
YOU HAD AN ENDOSCOPIC PROCEDURE TODAY AT THE [facility]: Refer to the procedure report that was given to you for any specific questions about what was found during the examination.  If the procedure report does not answer your questions, please call your gastroenterologist to clarify.  If you requested that your care partner not be given the details of your procedure findings, then the procedure report has been included in a sealed envelope for you to review at your convenience later.  YOU SHOULD EXPECT: Some feelings of bloating in the abdomen. Passage of more gas than usual.  Walking can help get rid of the air that was put into your GI tract during the procedure and reduce the bloating. If you had a lower endoscopy (such as a colonoscopy or flexible sigmoidoscopy) you may notice spotting of blood in your stool or on the toilet paper. If you underwent a bowel prep for your procedure, then you may not have a normal bowel movement for a few days.  DIET: Your first meal following the procedure should be a light meal and then it is ok to progress to your normal diet.  A half-sandwich or bowl of soup is an example of a good first meal.  Heavy or fried foods are harder to digest and may make you feel nauseous or bloated.  Likewise meals heavy in dairy and vegetables can cause extra gas to form and this can also increase the bloating.  Drink plenty of fluids but you should avoid alcoholic beverages for 24 hours.  ACTIVITY: Your care partner should take you home directly after the procedure.  You should plan to take it easy, moving slowly for the rest of the day.  You can resume normal activity the day after the procedure however you should NOT DRIVE or use heavy machinery for 24 hours (because of the sedation medicines used during the test).    SYMPTOMS TO REPORT IMMEDIATELY: A gastroenterologist can be reached at any hour.  During normal business hours, 8:30 AM to 5:00 PM Monday through Friday,  call [phone].  After hours and on weekends, please call the GI answering service at [phone] who will take a message and have the physician on call contact you.  Following upper endoscopy (EGD)  Vomiting of blood or coffee ground material  New chest pain or pain under the shoulder blades  Painful or persistently difficult swallowing  New shortness of breath  Fever of 100F or higher  Black, tarry-looking stools  FOLLOW UP: If any biopsies were taken you will be contacted by phone or by letter within the next 1-3 weeks.  Call your gastroenterologist if you have not heard about the biopsies in 3 weeks.  Our staff will call the home number listed on your records the next business day following your procedure to check on you and address any questions or concerns that you may have at that time regarding the information given to you following your procedure. This is a courtesy call and so if there is no answer at the home number and we have not heard from you through the emergency physician on call, we will assume that you have returned to your regular daily activities without incident.  SIGNATURES/CONFIDENTIALITY: You and/or your care partner have signed paperwork which will be entered into your electronic medical record.  These signatures attest to the fact that that the information above on your After Visit Summary has been reviewed and is understood.  Full responsibility of   the confidentiality of this discharge information lies with you and/or your care-partner. 

## 2012-03-01 ENCOUNTER — Telehealth: Payer: Self-pay | Admitting: *Deleted

## 2012-03-01 NOTE — Telephone Encounter (Signed)
  Follow up Call-  Call back number 02/29/2012  Post procedure Call Back phone  # [phone]  Permission to leave phone message Yes     Patient questions:  Do you have a fever, pain , or abdominal swelling? no Pain Score  0 *  Have you tolerated food without any problems? yes  Have you been able to return to your normal activities? yes  Do you have any questions about your discharge instructions: Diet   no Medications  no Follow up visit  no  Do you have questions or concerns about your Care? no  Actions: * If pain score is 4 or above: No action needed, pain <4.

## 2012-05-04 ENCOUNTER — Encounter: Payer: Medicare Other | Admitting: *Deleted

## 2012-05-08 ENCOUNTER — Telehealth: Payer: Self-pay | Admitting: Gastroenterology

## 2012-05-08 NOTE — Telephone Encounter (Signed)
Received copies from Dr. Levie Heritage 05/08/12. Forwarded 7 pages to Dr. Russella Dar ,for review.

## 2012-05-12 ENCOUNTER — Encounter: Payer: Self-pay | Admitting: *Deleted

## 2012-06-06 ENCOUNTER — Ambulatory Visit: Payer: PRIVATE HEALTH INSURANCE | Admitting: Cardiology

## 2012-06-09 ENCOUNTER — Ambulatory Visit: Payer: PRIVATE HEALTH INSURANCE | Admitting: Cardiology

## 2012-07-20 ENCOUNTER — Encounter: Payer: Self-pay | Admitting: Cardiology

## 2012-07-20 ENCOUNTER — Ambulatory Visit (INDEPENDENT_AMBULATORY_CARE_PROVIDER_SITE_OTHER): Payer: PRIVATE HEALTH INSURANCE | Admitting: Cardiology

## 2012-07-20 VITALS — BP 122/80 | HR 92 | Ht 65.0 in | Wt 162.8 lb

## 2012-07-20 DIAGNOSIS — I442 Atrioventricular block, complete: Secondary | ICD-10-CM

## 2012-07-20 DIAGNOSIS — R42 Dizziness and giddiness: Secondary | ICD-10-CM

## 2012-07-20 DIAGNOSIS — I10 Essential (primary) hypertension: Secondary | ICD-10-CM

## 2012-07-20 DIAGNOSIS — I2510 Atherosclerotic heart disease of native coronary artery without angina pectoris: Secondary | ICD-10-CM

## 2012-07-20 NOTE — Assessment & Plan Note (Signed)
No active angina. ECG with paced rhythm. Continue observation and medical therapy.

## 2012-07-20 NOTE — Assessment & Plan Note (Signed)
Status post pacemaker placement, followed by Dr. Allred. 

## 2012-07-20 NOTE — Progress Notes (Signed)
   Clinical Summary [name] is a 78 y.o.female presenting for followup. She was seen in December. She had device interrogation with Dr. Johney Frame back in February. She reports no angina. She does state that within the last 2 months she's been experiencing intermittent episodes of lightheadedness, feeling as if she might pass out. Seems to be fairly sporadic. Has been worse during the hot summer months. She reports no palpitations, no definite syncope. Her ECG today shows a ventricular paced rhythm at 92 beats per minutes with atrial tracking. She has not checked her blood pressure or heart rate during any of these episodes.   No Known Allergies  Current Outpatient Prescriptions  Medication Sig Dispense Refill  . amLODipine (NORVASC) 10 MG tablet Take 10 mg by mouth daily.        Marland Kitchen aspirin 81 MG tablet Take 81 mg by mouth daily.        . hydrochlorothiazide (HYDRODIURIL) 25 MG tablet Take 12.5 mg by mouth daily.      . lansoprazole (PREVACID) 30 MG capsule Take 30 mg by mouth daily.        Marland Kitchen levothyroxine (SYNTHROID, LEVOTHROID) 88 MCG tablet Take 88 mcg by mouth daily.        . sertraline (ZOLOFT) 100 MG tablet Take 50 mg by mouth daily.          Past Medical History  Diagnosis Date  . Complete heart block     s/p PPM  . Sarcoidosis   . Chronic diastolic heart failure   . Coronary atherosclerosis of native coronary artery     DES to LAD 2/05, normal LVEF  . Arthritis   . Depression   . Hyperlipidemia   . Hypothyroidism   . GERD (gastroesophageal reflux disease)   . Glaucoma   . Cataracts, bilateral     Past Surgical History  Procedure Date  . Anterior cervical discectomy     Cervical fusion  . Lumbar laminectomy     L3 S1  . Pacemaker insertion 2000, 2007    Medtronic  . Gallbladder surgery   . Cataracts     bilateral  . Colonoscopy   . Upper gastrointestinal endoscopy     Social History Ms. Haynes reports that she has never smoked. She has never used smokeless  tobacco. Ms. Beske reports that she does not drink alcohol.  Review of Systems Negative except as outlined.  Physical Examination Filed Vitals:   07/20/12 1008  BP: 122/80  Pulse: 92    Normally nourished appearing woman in no acute distress.  HEENT: Conjunctiva and lids normal, oropharynx moist mucosa.  Neck: Supple, no carotid bruits, no thyromegaly.  Lungs: Clear to auscultation, nonlabored.  Cardiac: Regular rate and rhythm.  Thorax: Stable device pocket site.  Extremities: No pitting edema.    Problem List and Plan   Dizziness Etiology not certain. I've asked her to maintain adequate hydration during the hot summer months. We will check on her next device interrogation visit as well, last time device functioning normally.. Also asked her to get a home blood pressure cuff so that she can check her blood pressure and heart rate more easily. No change in medications at this time.  CAD, NATIVE VESSEL No active angina. ECG with paced rhythm. Continue observation and medical therapy.  HYPERTENSION, BENIGN Blood pressure well controlled today.  AV BLOCK, COMPLETE Status post pacemaker placement, followed by Dr. Johney Frame.    [name], M.D., F.A.C.C.

## 2012-07-20 NOTE — Assessment & Plan Note (Signed)
Etiology not certain. I've asked her to maintain adequate hydration during the hot summer months. We will check on her next device interrogation visit as well, last time device functioning normally.. Also asked her to get a home blood pressure cuff so that she can check her blood pressure and heart rate more easily. No change in medications at this time.

## 2012-07-20 NOTE — Patient Instructions (Addendum)
Your physician recommends that you schedule a follow-up appointment in: 6 months with Dr. Diona Browner. You will receive a reminder letter in the mail in about 4 months reminding you to call and schedule your appointment. If you don't receive this letter, please contact our office.  Your physician recommends that you schedule a follow-up appointment on August 2nd 2013 @9 :00am with Dr. Johney Frame for a pacer check. Your physician recommends that you continue on your current medications as directed. Please refer to the Current Medication list given to you today.  Please stay hydrated and out of sun.

## 2012-07-20 NOTE — Assessment & Plan Note (Signed)
Blood pressure well-controlled today. 

## 2012-07-28 ENCOUNTER — Encounter: Payer: PRIVATE HEALTH INSURANCE | Admitting: Internal Medicine

## 2012-09-21 ENCOUNTER — Telehealth: Payer: Self-pay | Admitting: *Deleted

## 2012-09-21 DIAGNOSIS — R55 Syncope and collapse: Secondary | ICD-10-CM

## 2012-09-21 NOTE — Telephone Encounter (Signed)
Calling to see if patient could be seen today.  Problems relating to her device & orthostatics with significant drops in readings (greater than 20).  Advised her that this patient did have OV scheduled with Dr. Johney Frame for 8/2 & she did not show.  Advised her to send patient to ED for evaluation.  She verbalized understanding & agreed.

## 2012-09-22 DIAGNOSIS — I359 Nonrheumatic aortic valve disorder, unspecified: Secondary | ICD-10-CM

## 2012-09-22 DIAGNOSIS — I959 Hypotension, unspecified: Secondary | ICD-10-CM

## 2012-09-22 DIAGNOSIS — R42 Dizziness and giddiness: Secondary | ICD-10-CM

## 2013-01-08 ENCOUNTER — Encounter: Payer: PRIVATE HEALTH INSURANCE | Admitting: Internal Medicine

## 2013-01-19 ENCOUNTER — Ambulatory Visit (INDEPENDENT_AMBULATORY_CARE_PROVIDER_SITE_OTHER): Payer: PRIVATE HEALTH INSURANCE | Admitting: Cardiology

## 2013-01-19 ENCOUNTER — Encounter: Payer: Self-pay | Admitting: *Deleted

## 2013-01-19 ENCOUNTER — Encounter: Payer: Self-pay | Admitting: Cardiology

## 2013-01-19 VITALS — BP 120/79 | HR 76 | Ht 65.0 in | Wt 163.0 lb

## 2013-01-19 DIAGNOSIS — I10 Essential (primary) hypertension: Secondary | ICD-10-CM

## 2013-01-19 DIAGNOSIS — R0600 Dyspnea, unspecified: Secondary | ICD-10-CM

## 2013-01-19 DIAGNOSIS — I2510 Atherosclerotic heart disease of native coronary artery without angina pectoris: Secondary | ICD-10-CM

## 2013-01-19 DIAGNOSIS — I442 Atrioventricular block, complete: Secondary | ICD-10-CM

## 2013-01-19 NOTE — Assessment & Plan Note (Signed)
Good blood pressure control today. No longer on diuretic. Has history of orthostasis.

## 2013-01-19 NOTE — Patient Instructions (Addendum)

## 2013-01-19 NOTE — Progress Notes (Signed)
Clinical Summary [name] is a 79 y.o.female presenting for followup. She was seen in July 2013. Record review finds hospitalization at [facility] in September related to orthostatic hypotension. She was hydrated, taken off of HCTZ. Followup echocardiogram showed LVEF 65%, mild aortic stenosis.  She is due for a pacer interrogation next week, has not had one recently. She reports no palpitations or syncope. Does state that her energy is poor, she describes shortness of breath ambulating around her house. No frank chest pain, although she relates an episode of significant weakness and diaphoresis several months ago.  Last ischemic evaluation was via Myoview in 2011, showed no active ischemia.  She reports compliance with her medications, is no longer on diuretic. Blood pressure looks good today.   No Known Allergies  Current Outpatient Prescriptions  Medication Sig Dispense Refill  . amLODipine (NORVASC) 10 MG tablet Take 10 mg by mouth daily.        Marland Kitchen aspirin 81 MG tablet Take 81 mg by mouth daily.        . dorzolamide-timolol (COSOPT) 22.3-6.8 MG/ML ophthalmic solution Place 1 drop into the left eye 2 (two) times daily.       . lansoprazole (PREVACID) 30 MG capsule Take 30 mg by mouth daily.        Marland Kitchen levothyroxine (SYNTHROID, LEVOTHROID) 88 MCG tablet Take 88 mcg by mouth daily.        . Multiple Vitamin (MULTIVITAMIN) tablet Take 1 tablet by mouth daily.      . sertraline (ZOLOFT) 100 MG tablet Take 50 mg by mouth daily.          Past Medical History  Diagnosis Date  . Complete heart block     s/p PPM  . Sarcoidosis   . Chronic diastolic heart failure   . Coronary atherosclerosis of native coronary artery     DES to LAD 2/05, normal LVEF  . Arthritis   . Depression   . Hyperlipidemia   . Hypothyroidism   . GERD (gastroesophageal reflux disease)   . Glaucoma(365)   . Cataracts, bilateral     Social History Cynthia [name] reports that she has never smoked. She has never  used smokeless tobacco. Cynthia [name] reports that she does not drink alcohol.  Review of Systems Reports memory problems over time. Stable appetite. No bleeding episodes. Otherwise negative.  Physical Examination Filed Vitals:   01/19/13 0855  BP: 120/79  Pulse: 76   Filed Weights   01/19/13 0855  Weight: 163 lb (73.936 kg)    Normally nourished appearing woman in no acute distress.  HEENT: Conjunctiva and lids normal, oropharynx moist mucosa.  Neck: Supple, no carotid bruits, no thyromegaly.  Lungs: Clear to auscultation, nonlabored.  Cardiac: Regular rate and rhythm.  Thorax: Stable device pocket site.  Extremities: No pitting edema.    Problem List and Plan   CAD, NATIVE VESSEL No chest pain symptoms, but reports increased shortness of breath and weakness with activity. Plan to continue medical therapy, followup with a Lexiscan Myoview for assessment of active ischemia with prior history of DES to the LAD in 2005. If low risk, willl keep 6 month followup. Otherwise can reassess sooner.  AV BLOCK, COMPLETE Status post pacemaker placement, now followed by Dr. Johney Frame. She has not had recent pacer interrogation, scheduled for next week. Reports no syncope.  HYPERTENSION, BENIGN Good blood pressure control today. No longer on diuretic. Has history of orthostasis.    [name], M.D., F.A.C.C.

## 2013-01-19 NOTE — Assessment & Plan Note (Signed)
Status post pacemaker placement, now followed by Dr. Johney Frame. She has not had recent pacer interrogation, scheduled for next week. Reports no syncope.

## 2013-01-19 NOTE — Assessment & Plan Note (Signed)
No chest pain symptoms, but reports increased shortness of breath and weakness with activity. Plan to continue medical therapy, followup with a Lexiscan Myoview for assessment of active ischemia with prior history of DES to the LAD in 2005. If low risk, willl keep 6 month followup. Otherwise can reassess sooner.

## 2013-01-24 ENCOUNTER — Encounter: Payer: PRIVATE HEALTH INSURANCE | Admitting: Internal Medicine

## 2013-01-30 ENCOUNTER — Telehealth: Payer: Self-pay | Admitting: Cardiology

## 2013-01-30 ENCOUNTER — Other Ambulatory Visit: Payer: Self-pay | Admitting: *Deleted

## 2013-01-30 DIAGNOSIS — I2510 Atherosclerotic heart disease of native coronary artery without angina pectoris: Secondary | ICD-10-CM

## 2013-01-30 DIAGNOSIS — R0600 Dyspnea, unspecified: Secondary | ICD-10-CM

## 2013-01-30 NOTE — Telephone Encounter (Signed)
Reschedule at her convenience. She can have the test done either at [facility] or [place], whichever is more convenient for her.

## 2013-01-30 NOTE — Telephone Encounter (Signed)
Mrs. Carolan called today stating that she needs to cancel her stress test scheduled for [facility]. States that she fell and is not Up to having the test now. She also said that she has noone to carry her to [facility] and would like for test to be scheduled at  [facility]. Will verify with Dr. Diona Browner on this.

## 2013-02-05 ENCOUNTER — Encounter: Payer: Self-pay | Admitting: Internal Medicine

## 2013-02-05 ENCOUNTER — Ambulatory Visit (INDEPENDENT_AMBULATORY_CARE_PROVIDER_SITE_OTHER): Payer: PRIVATE HEALTH INSURANCE | Admitting: Internal Medicine

## 2013-02-05 VITALS — BP 126/81 | HR 69 | Ht 65.0 in | Wt 162.0 lb

## 2013-02-05 DIAGNOSIS — R5381 Other malaise: Secondary | ICD-10-CM

## 2013-02-05 DIAGNOSIS — R0602 Shortness of breath: Secondary | ICD-10-CM

## 2013-02-05 DIAGNOSIS — I442 Atrioventricular block, complete: Secondary | ICD-10-CM

## 2013-02-05 DIAGNOSIS — I10 Essential (primary) hypertension: Secondary | ICD-10-CM

## 2013-02-05 DIAGNOSIS — R531 Weakness: Secondary | ICD-10-CM | POA: Insufficient documentation

## 2013-02-05 DIAGNOSIS — R5383 Other fatigue: Secondary | ICD-10-CM

## 2013-02-05 DIAGNOSIS — I2510 Atherosclerotic heart disease of native coronary artery without angina pectoris: Secondary | ICD-10-CM

## 2013-02-05 DIAGNOSIS — R0789 Other chest pain: Secondary | ICD-10-CM

## 2013-02-05 LAB — PACEMAKER DEVICE OBSERVATION
ATRIAL PACING PM: 56
DEVICE MODEL PM: 208001
VENTRICULAR PACING PM: 100

## 2013-02-05 NOTE — Progress Notes (Signed)
PCP:  Ignatius Specking., MD Primary Cardiologist:  Dr Diona Browner  The patient presents today for routine electrophysiology followup. She continues to have fatigue.  She also reports chest heaviness at times and exertional shortness of breath. She had a recent myoview ordered but it was cancelled due to the snow.  She is rescheduled later this month.  At times she reports that she feels "so weak that she cant stand". Today, she denies symptoms of palpitations, lower extremity edema, dizziness, presyncope, syncope, or neurologic sequela.  The patient feels that she is tolerating medications without difficulties and is otherwise without complaint today.   Past Medical History  Diagnosis Date  . Complete heart block     s/p PPM  . Sarcoidosis   . Chronic diastolic heart failure   . Coronary atherosclerosis of native coronary artery     DES to LAD 2/05, normal LVEF  . Arthritis   . Depression   . Hyperlipidemia   . Hypothyroidism   . GERD (gastroesophageal reflux disease)   . Glaucoma(365)   . Cataracts, bilateral    Past Surgical History  Procedure Laterality Date  . Anterior cervical discectomy      Cervical fusion  . Lumbar laminectomy      L3 S1  . Pacemaker insertion  2000, 2007    Medtronic  . Gallbladder surgery    . Cataracts      bilateral  . Colonoscopy    . Upper gastrointestinal endoscopy      Current Outpatient Prescriptions  Medication Sig Dispense Refill  . amLODipine (NORVASC) 10 MG tablet Take 10 mg by mouth daily.        Marland Kitchen aspirin 81 MG tablet Take 81 mg by mouth daily.        . dorzolamide-timolol (COSOPT) 22.3-6.8 MG/ML ophthalmic solution Place 1 drop into the left eye 2 (two) times daily.       . lansoprazole (PREVACID) 30 MG capsule Take 30 mg by mouth daily.        Marland Kitchen levothyroxine (SYNTHROID, LEVOTHROID) 88 MCG tablet Take 88 mcg by mouth daily.        . Multiple Vitamin (MULTIVITAMIN) tablet Take 1 tablet by mouth daily.      . sertraline (ZOLOFT) 100 MG  tablet Take 50 mg by mouth daily.         No current facility-administered medications for this visit.    No Known Allergies  History   Social History  . Marital Status: Divorced    Spouse Name: N/A    Number of Children: 3  . Years of Education: N/A   Occupational History  . retired    Social History Main Topics  . Smoking status: Never Smoker   . Smokeless tobacco: Never Used  . Alcohol Use: No  . Drug Use: No  . Sexually Active: Not on file   Other Topics Concern  . Not on file   Social History Narrative  . No narrative on file    Physical Exam: Filed Vitals:   02/05/13 1554  BP: 126/81  Pulse: 69  Height: 5\' 5"  (1.651 m)  Weight: 162 lb (73.483 kg)    GEN- The patient is well appearing, alert and oriented x 3 today.   Head- normocephalic, atraumatic Eyes-  Sclera clear, conjunctiva pink Ears- hearing intact Oropharynx- clear Neck- supple, no JVP Lymph- no cervical lymphadenopathy Lungs- Clear to ausculation bilaterally, normal work of breathing Chest- pacemaker pocket is well healed Heart- Regular rate and rhythm, no  murmurs, rubs or gallops, PMI not laterally displaced GI- soft, NT, ND, + BS Extremities- no clubbing, cyanosis, or edema  Pacemaker interrogation- reviewed in detail today,  See PACEART report  Assessment and Plan: 1. Weakness/ fatigue/ chest tightness/ SOB Unclear etiology Normal pacemaker function Will check CBC, BMET, and TSH to look for metabolic causes Will reschedule myoview for next possible time If she develops sustained chest discomfort or worsening symptoms she will need to call 911  2. Complete heart block Normal pacemaker function See Pace Art report No changes today  Follow-up with Dr Diona Browner  Return to the device clinic in 6 months I will see in 12 months

## 2013-02-05 NOTE — Patient Instructions (Signed)
   Reschedule stress test  Labs for CBC, BMET, TSH  Office will contact with results Continue all current medications.  Allred - 1 year  Gene - 2 weeks

## 2013-02-07 ENCOUNTER — Encounter (HOSPITAL_COMMUNITY): Payer: PRIVATE HEALTH INSURANCE

## 2013-02-19 ENCOUNTER — Ambulatory Visit: Payer: PRIVATE HEALTH INSURANCE | Admitting: Physician Assistant

## 2013-02-22 DIAGNOSIS — I2510 Atherosclerotic heart disease of native coronary artery without angina pectoris: Secondary | ICD-10-CM

## 2013-02-23 ENCOUNTER — Ambulatory Visit: Payer: PRIVATE HEALTH INSURANCE | Admitting: Physician Assistant

## 2013-02-27 ENCOUNTER — Telehealth: Payer: Self-pay | Admitting: *Deleted

## 2013-02-27 NOTE — Telephone Encounter (Signed)
Message copied by Eustace Moore on Tue Feb 27, 2013  4:52 PM ------      Message from: [name]      Created: Sat Feb 24, 2013  7:42 AM       Reviewed. Although perfusion abnormality could be secondary to pacing, she has had chest pain symptoms and history of PCI LAD. Please make sure she has a followup visit with me (ior Mr. Shara Blazing) soon to discuss the results and determine if need to proceed further with cardiac catheterization. ------

## 2013-02-27 NOTE — Telephone Encounter (Signed)
Patient informed. 

## 2013-02-28 ENCOUNTER — Ambulatory Visit: Payer: PRIVATE HEALTH INSURANCE | Admitting: Physician Assistant

## 2013-02-28 ENCOUNTER — Encounter: Payer: Self-pay | Admitting: Physician Assistant

## 2013-02-28 ENCOUNTER — Ambulatory Visit (INDEPENDENT_AMBULATORY_CARE_PROVIDER_SITE_OTHER): Payer: PRIVATE HEALTH INSURANCE | Admitting: Physician Assistant

## 2013-02-28 ENCOUNTER — Telehealth: Payer: Self-pay | Admitting: Physician Assistant

## 2013-02-28 ENCOUNTER — Encounter: Payer: Self-pay | Admitting: *Deleted

## 2013-02-28 VITALS — BP 130/77 | HR 77 | Ht 65.0 in | Wt 161.0 lb

## 2013-02-28 DIAGNOSIS — R9439 Abnormal result of other cardiovascular function study: Secondary | ICD-10-CM

## 2013-02-28 DIAGNOSIS — E785 Hyperlipidemia, unspecified: Secondary | ICD-10-CM

## 2013-02-28 DIAGNOSIS — Z01810 Encounter for preprocedural cardiovascular examination: Secondary | ICD-10-CM

## 2013-02-28 DIAGNOSIS — I2510 Atherosclerotic heart disease of native coronary artery without angina pectoris: Secondary | ICD-10-CM

## 2013-02-28 DIAGNOSIS — I10 Essential (primary) hypertension: Secondary | ICD-10-CM

## 2013-02-28 DIAGNOSIS — I442 Atrioventricular block, complete: Secondary | ICD-10-CM

## 2013-02-28 MED ORDER — NITROGLYCERIN 0.4 MG SL SUBL: 0.4000 mg | SUBLINGUAL_TABLET | SUBLINGUAL | Status: DC | PRN

## 2013-02-28 NOTE — Telephone Encounter (Signed)
Left JV cath - tomorrow, 3/6 - 12:00 - [place] Thanks, and sorry for such short notice.

## 2013-02-28 NOTE — Patient Instructions (Signed)
   Nitroglycerin as needed for severe chest pain only - refill sent to pharm  Pre-cath labs - BMET, CBC, PT, PTT, CHEST X-RAY, FLP  Left JV heart catherization - patient to call back after checking with son about date he can provide transportation Continue all current medications. Follow up will be given after procedure

## 2013-02-28 NOTE — Telephone Encounter (Signed)
[employer] ZOXW#[id number]  EXP 04-14-13

## 2013-02-28 NOTE — Progress Notes (Signed)
Primary Cardiologist: Sam McDowell, MD   HPI: Patient presents today for review of Lexiscan Cardiolite test results, ordered by Dr. Allred during recent routine device clinic followup, for evaluation of CP and DOE. The results were reviewed by Dr. McDowell, who noted a small, reversible defect in the basal inferoseptal region; EF 79%. However, he also noted that this perfusion abnormality could be secondary to ventricular pacing.  Patient suggests an approximate two-month history of worsening DOE with associated chest pressure, improved with rest. She does not recall the symptoms associated with her prior PCI in 2005. She denies exertional CP when walking on level ground.   Of note, patient also had extensive blood work drawn, per Dr. Allred, with results as follows:   - January 16: BUN 19, creatinine 1.1, potassium 4.6. NL CBC. TSH 0.8  No Known Allergies  Current Outpatient Prescriptions  Medication Sig Dispense Refill  . amLODipine (NORVASC) 10 MG tablet Take 10 mg by mouth daily.        . aspirin 81 MG tablet Take 81 mg by mouth daily.        . dorzolamide-timolol (COSOPT) 22.3-6.8 MG/ML ophthalmic solution Place 1 drop into the left eye 2 (two) times daily.       . lansoprazole (PREVACID) 30 MG capsule Take 30 mg by mouth daily.        . levothyroxine (SYNTHROID, LEVOTHROID) 88 MCG tablet Take 88 mcg by mouth daily.        . Multiple Vitamin (MULTIVITAMIN) tablet Take 1 tablet by mouth daily.      . sertraline (ZOLOFT) 100 MG tablet Take 50 mg by mouth daily.         No current facility-administered medications for this visit.    Past Medical History  Diagnosis Date  . Complete heart block     s/p PPM  . Sarcoidosis   . Chronic diastolic heart failure   . Coronary atherosclerosis of native coronary artery     DES to LAD 2/05, normal LVEF  . Arthritis   . Depression   . Hyperlipidemia   . Hypothyroidism   . GERD (gastroesophageal reflux disease)   . Glaucoma(365)   .  Cataracts, bilateral     Past Surgical History  Procedure Laterality Date  . Anterior cervical discectomy      Cervical fusion  . Lumbar laminectomy      L3 S1  . Pacemaker insertion  2000, 2007    [employer]tr[employer]  . Gallbladder surgery    . Cataracts      bilateral  . Colonoscopy    . Upper gastrointestinal endoscopy      History   Social History  . Marital Status: Divorced    Spouse Name: N/A    Number of Children: 3  . Years of Education: N/A   Occupational History  . retired    Social History Main Topics  . Smoking status: Never Smoker   . Smokeless tobacco: Never Used  . Alcohol Use: No  . Drug Use: No  . Sexually Active: Not on file   Other Topics Concern  . Not on file   Social History Narrative  . No narrative on file    Family History  Problem Relation Age of Onset  . Colon cancer Neg Hx   . Diabetes Son   . Heart attack Mother   . Heart attack Father     ROS: no nausea, vomiting; no fever, chills; no melena, hematochezia; no claudication  PHYSICAL EXAM:   BP 130/77  Pulse 77  Ht 5' 5" (1.651 m)  Wt 161 lb (73.029 kg)  BMI 26.79 kg/m2  SpO2 96% GENERAL: 79-year-old female; NAD HEENT: NCAT, PERRLA, EOMI; sclera clear; no xanthelasma NECK: palpable bilateral carotid pulses, no bruits; no JVD; no TM LUNGS: CTA bilaterally CARDIAC: RRR (S1, S2); no significant murmurs; no rubs or gallops ABDOMEN: soft, non-tender; intact BS EXTREMETIES: intact distal pulses; no significant peripheral edema SKIN: warm/dry; no obvious rash/lesions MUSCULOSKELETAL: no joint deformity NEURO: no focal deficit; NL affect   EKG:    ASSESSMENT & PLAN:  CAD, NATIVE VESSEL Recommendation is to proceed with a diagnostic cardiac catheterization and possible PCI, to be scheduled in our JV lab this week. Patient suggests worsening exertional SOB with associated CP, over the past 2 months. She denies symptoms at rest. Results of the recent abnormal Lexiscan study, reviewed  by Dr. McDowell, were reviewed with both patient and Dr. Katz, who approved this plan. Risks/benefits of procedure were discussed. Patient will be provided with NTG prescription. We will also reassess lipid status. Of note, patient cites prior intolerance to multiple statins, notably Lipitor.  AV BLOCK, COMPLETE Status post recent scheduled device check with Dr. Allred, indicating NL PPM function.  HYPERTENSION, BENIGN Stable on current medication regimen  HYPERLIPIDEMIA-MIXED Reported intolerance to prior statin therapy. We'll reassess with FLP.    Gene Serpe, PAC  

## 2013-02-28 NOTE — Assessment & Plan Note (Signed)
Status post recent scheduled device check with Dr. Johney Frame, indicating NL PPM function.

## 2013-02-28 NOTE — Assessment & Plan Note (Signed)
Recommendation is to proceed with a diagnostic cardiac catheterization and possible PCI, to be scheduled in our [facility] lab this week. Patient suggests worsening exertional SOB with associated CP, over the past 2 months. She denies symptoms at rest. Results of the recent abnormal Lexiscan study, reviewed by Dr. Diona Browner, were reviewed with both patient and Dr. Myrtis Ser, who approved this plan. Risks/benefits of procedure were discussed. Patient will be provided with NTG prescription. We will also reassess lipid status. Of note, patient cites prior intolerance to multiple statins, notably Lipitor.

## 2013-02-28 NOTE — Assessment & Plan Note (Signed)
Stable on current medication regimen 

## 2013-02-28 NOTE — Assessment & Plan Note (Signed)
Reported intolerance to prior statin therapy. We'll reassess with FLP.

## 2013-03-01 ENCOUNTER — Encounter (HOSPITAL_BASED_OUTPATIENT_CLINIC_OR_DEPARTMENT_OTHER)
Admission: RE | Disposition: A | Discharge status: Hospice | Payer: Self-pay | Source: Ambulatory Visit | Attending: Internal Medicine

## 2013-03-01 ENCOUNTER — Inpatient Hospital Stay (HOSPITAL_BASED_OUTPATIENT_CLINIC_OR_DEPARTMENT_OTHER)
Admission: RE | Admit: 2013-03-01 | Discharge: 2013-03-01 | Disposition: A | Discharge status: Hospice | Payer: PRIVATE HEALTH INSURANCE | Source: Ambulatory Visit | Attending: Internal Medicine | Admitting: Internal Medicine

## 2013-03-01 ENCOUNTER — Encounter (HOSPITAL_COMMUNITY)
Admission: AD | Disposition: A | Discharge status: Home / Self Care | Payer: Self-pay | Source: Ambulatory Visit | Attending: Cardiovascular Disease

## 2013-03-01 ENCOUNTER — Ambulatory Visit (HOSPITAL_COMMUNITY)
Admission: AD | Admit: 2013-03-01 | Discharge: 2013-03-04 | Disposition: A | Discharge status: Home / Self Care | Payer: PRIVATE HEALTH INSURANCE | Source: Ambulatory Visit | Attending: Cardiovascular Disease | Admitting: Cardiovascular Disease

## 2013-03-01 ENCOUNTER — Other Ambulatory Visit: Payer: Self-pay | Admitting: Physician Assistant

## 2013-03-01 ENCOUNTER — Encounter (HOSPITAL_COMMUNITY): Payer: Self-pay | Admitting: *Deleted

## 2013-03-01 DIAGNOSIS — I255 Ischemic cardiomyopathy: Secondary | ICD-10-CM

## 2013-03-01 DIAGNOSIS — Y831 Surgical operation with implant of artificial internal device as the cause of abnormal reaction of the patient, or of later complication, without mention of misadventure at the time of the procedure: Secondary | ICD-10-CM | POA: Insufficient documentation

## 2013-03-01 DIAGNOSIS — I2510 Atherosclerotic heart disease of native coronary artery without angina pectoris: Secondary | ICD-10-CM

## 2013-03-01 DIAGNOSIS — R0609 Other forms of dyspnea: Secondary | ICD-10-CM | POA: Insufficient documentation

## 2013-03-01 DIAGNOSIS — I2589 Other forms of chronic ischemic heart disease: Secondary | ICD-10-CM | POA: Insufficient documentation

## 2013-03-01 DIAGNOSIS — I5032 Chronic diastolic (congestive) heart failure: Secondary | ICD-10-CM | POA: Insufficient documentation

## 2013-03-01 DIAGNOSIS — R0989 Other specified symptoms and signs involving the circulatory and respiratory systems: Secondary | ICD-10-CM | POA: Insufficient documentation

## 2013-03-01 DIAGNOSIS — Z950 Presence of cardiac pacemaker: Secondary | ICD-10-CM | POA: Insufficient documentation

## 2013-03-01 DIAGNOSIS — R0602 Shortness of breath: Secondary | ICD-10-CM

## 2013-03-01 DIAGNOSIS — I10 Essential (primary) hypertension: Secondary | ICD-10-CM

## 2013-03-01 DIAGNOSIS — I442 Atrioventricular block, complete: Secondary | ICD-10-CM

## 2013-03-01 DIAGNOSIS — I509 Heart failure, unspecified: Secondary | ICD-10-CM | POA: Insufficient documentation

## 2013-03-01 DIAGNOSIS — T82897A Other specified complication of cardiac prosthetic devices, implants and grafts, initial encounter: Secondary | ICD-10-CM | POA: Insufficient documentation

## 2013-03-01 DIAGNOSIS — R0789 Other chest pain: Secondary | ICD-10-CM

## 2013-03-01 DIAGNOSIS — R079 Chest pain, unspecified: Secondary | ICD-10-CM | POA: Insufficient documentation

## 2013-03-01 DIAGNOSIS — R011 Cardiac murmur, unspecified: Secondary | ICD-10-CM | POA: Insufficient documentation

## 2013-03-01 DIAGNOSIS — N289 Disorder of kidney and ureter, unspecified: Secondary | ICD-10-CM | POA: Insufficient documentation

## 2013-03-01 DIAGNOSIS — I200 Unstable angina: Secondary | ICD-10-CM | POA: Insufficient documentation

## 2013-03-01 DIAGNOSIS — R9439 Abnormal result of other cardiovascular function study: Secondary | ICD-10-CM | POA: Insufficient documentation

## 2013-03-01 HISTORY — PX: PERCUTANEOUS CORONARY STENT INTERVENTION (PCI-S): SHX6016

## 2013-03-01 HISTORY — DX: Ischemic cardiomyopathy: I25.5

## 2013-03-01 HISTORY — PX: PERCUTANEOUS CORONARY STENT INTERVENTION (PCI-S): SHX5485

## 2013-03-01 LAB — POCT I-STAT 3, VENOUS BLOOD GAS (G3P V)
Acid-base deficit: 2 mmol/L (ref 0.0–2.0)
pCO2, Ven: 40.1 mmHg — ABNORMAL LOW (ref 45.0–50.0)
pH, Ven: 7.358 — ABNORMAL HIGH (ref 7.250–7.300)
pO2, Ven: 33 mmHg (ref 30.0–45.0)

## 2013-03-01 LAB — POCT I-STAT 3, ART BLOOD GAS (G3+)
Bicarbonate: 23.5 mEq/L (ref 20.0–24.0)
O2 Saturation: 97 %
pO2, Arterial: 84 mmHg (ref 80.0–100.0)

## 2013-03-01 SURGERY — PERCUTANEOUS CORONARY STENT INTERVENTION (PCI-S)
Anesthesia: LOCAL

## 2013-03-01 SURGERY — JV LEFT HEART CATHETERIZATION WITH CORONARY ANGIOGRAM
Anesthesia: Moderate Sedation

## 2013-03-01 MED ORDER — HEPARIN (PORCINE) IN NACL 2-0.9 UNIT/ML-% IJ SOLN
INTRAMUSCULAR | Status: AC
  Filled 2013-03-01: qty 1000

## 2013-03-01 MED ORDER — LIDOCAINE HCL (PF) 1 % IJ SOLN
INTRAMUSCULAR | Status: AC
  Filled 2013-03-01: qty 30

## 2013-03-01 MED ORDER — ONDANSETRON HCL 4 MG/2ML IJ SOLN: 4.0000 mg | Freq: Four times a day (QID) | INTRAMUSCULAR | Status: DC | PRN

## 2013-03-01 MED ORDER — CLOPIDOGREL BISULFATE 75 MG PO TABS
ORAL_TABLET | ORAL | Status: AC
  Filled 2013-03-01: qty 2

## 2013-03-01 MED ORDER — ASPIRIN 81 MG PO CHEW
81.0000 mg | CHEWABLE_TABLET | Freq: Every day | ORAL | Status: DC
  Administered 2013-03-02 – 2013-03-04 (×3): 81 mg via ORAL
  Filled 2013-03-01 (×3): qty 1

## 2013-03-01 MED ORDER — ASPIRIN 81 MG PO CHEW
324.0000 mg | CHEWABLE_TABLET | ORAL | Status: AC
  Administered 2013-03-01: 324 mg via ORAL

## 2013-03-01 MED ORDER — SODIUM CHLORIDE 0.9 % IJ SOLN: 3.0000 mL | INTRAMUSCULAR | Status: DC | PRN

## 2013-03-01 MED ORDER — ATORVASTATIN CALCIUM 20 MG PO TABS
20.0000 mg | ORAL_TABLET | Freq: Every day | ORAL | Status: DC
  Filled 2013-03-01: qty 1

## 2013-03-01 MED ORDER — NITROGLYCERIN 1 MG/10 ML FOR IR/CATH LAB
INTRA_ARTERIAL | Status: AC
  Filled 2013-03-01: qty 10

## 2013-03-01 MED ORDER — ACETAMINOPHEN 325 MG PO TABS: 650.0000 mg | ORAL_TABLET | ORAL | Status: DC | PRN

## 2013-03-01 MED ORDER — SODIUM CHLORIDE 0.9 % IJ SOLN: 3.0000 mL | Freq: Two times a day (BID) | INTRAMUSCULAR | Status: DC

## 2013-03-01 MED ORDER — AMLODIPINE BESYLATE 10 MG PO TABS
10.0000 mg | ORAL_TABLET | Freq: Every day | ORAL | Status: DC
  Administered 2013-03-02 – 2013-03-03 (×2): 10 mg via ORAL
  Filled 2013-03-01 (×3): qty 1

## 2013-03-01 MED ORDER — HYDRALAZINE HCL 20 MG/ML IJ SOLN
INTRAMUSCULAR | Status: AC
  Filled 2013-03-01: qty 1

## 2013-03-01 MED ORDER — NITROGLYCERIN 0.4 MG SL SUBL: 0.4000 mg | SUBLINGUAL_TABLET | SUBLINGUAL | Status: DC | PRN

## 2013-03-01 MED ORDER — SODIUM CHLORIDE 0.9 % IV SOLN: INTRAVENOUS | Status: AC

## 2013-03-01 MED ORDER — SODIUM CHLORIDE 0.9 % IV SOLN: 250.0000 mL | INTRAVENOUS | Status: DC | PRN

## 2013-03-01 MED ORDER — LEVOTHYROXINE SODIUM 88 MCG PO TABS
88.0000 ug | ORAL_TABLET | Freq: Every day | ORAL | Status: DC
  Administered 2013-03-02 – 2013-03-04 (×3): 88 ug via ORAL
  Filled 2013-03-01 (×5): qty 1

## 2013-03-01 MED ORDER — BIVALIRUDIN 250 MG IV SOLR
INTRAVENOUS | Status: AC
  Filled 2013-03-01: qty 250

## 2013-03-01 MED ORDER — FENTANYL CITRATE 0.05 MG/ML IJ SOLN
INTRAMUSCULAR | Status: AC
  Filled 2013-03-01: qty 2

## 2013-03-01 MED ORDER — SERTRALINE HCL 50 MG PO TABS
50.0000 mg | ORAL_TABLET | Freq: Every day | ORAL | Status: DC
  Administered 2013-03-02 – 2013-03-04 (×3): 50 mg via ORAL
  Filled 2013-03-01 (×3): qty 1

## 2013-03-01 MED ORDER — FAMOTIDINE IN NACL 20-0.9 MG/50ML-% IV SOLN
INTRAVENOUS | Status: AC
  Filled 2013-03-01: qty 50

## 2013-03-01 MED ORDER — CLOPIDOGREL BISULFATE 75 MG PO TABS
ORAL_TABLET | ORAL | Status: AC
  Filled 2013-03-01: qty 6

## 2013-03-01 MED ORDER — HEPARIN BOLUS VIA INFUSION: 4000.0000 [IU] | Freq: Once | INTRAVENOUS | Status: DC

## 2013-03-01 MED ORDER — CLOPIDOGREL BISULFATE 75 MG PO TABS
75.0000 mg | ORAL_TABLET | Freq: Every day | ORAL | Status: DC
  Administered 2013-03-02 – 2013-03-04 (×3): 75 mg via ORAL
  Filled 2013-03-01 (×4): qty 1

## 2013-03-01 MED ORDER — SODIUM CHLORIDE 0.9 % IV SOLN: INTRAVENOUS | Status: DC

## 2013-03-01 MED ORDER — MIDAZOLAM HCL 2 MG/2ML IJ SOLN
INTRAMUSCULAR | Status: AC
  Filled 2013-03-01: qty 2

## 2013-03-01 MED ORDER — DORZOLAMIDE HCL-TIMOLOL MAL 2-0.5 % OP SOLN
1.0000 [drp] | Freq: Two times a day (BID) | OPHTHALMIC | Status: DC
  Administered 2013-03-01 – 2013-03-04 (×6): 1 [drp] via OPHTHALMIC
  Filled 2013-03-01 (×2): qty 10

## 2013-03-01 MED ORDER — SODIUM CHLORIDE 0.9 % IV SOLN
INTRAVENOUS | Status: DC
  Administered 2013-03-01: 12:00:00 via INTRAVENOUS

## 2013-03-01 NOTE — H&P (Signed)
Primary Cardiologist: Simona Huh, MD   HPI: Patient admitted today after outpatient cath per Dr. Gala Romney. I have been asked to to perform PCI of the LAD. Recent abnormal stress myoview.  (small, reversible defect in the basal inferoseptal region; EF 79%). Cardiac cath this am with severe restenosis mid LAD stent.   No Known Allergies    Current Outpatient Prescriptions   Medication  Sig  Dispense  Refill   .  amLODipine (NORVASC) 10 MG tablet  Take 10 mg by mouth daily.           Marland Kitchen  aspirin 81 MG tablet  Take 81 mg by mouth daily.           .  dorzolamide-timolol (COSOPT) 22.3-6.8 MG/ML ophthalmic solution  Place 1 drop into the left eye 2 (two) times daily.          .  lansoprazole (PREVACID) 30 MG capsule  Take 30 mg by mouth daily.           Marland Kitchen  levothyroxine (SYNTHROID, LEVOTHROID) 88 MCG tablet  Take 88 mcg by mouth daily.           .  Multiple Vitamin (MULTIVITAMIN) tablet  Take 1 tablet by mouth daily.         .  sertraline (ZOLOFT) 100 MG tablet  Take 50 mg by mouth daily.               No current facility-administered medications for this visit.       Past Medical History   Diagnosis  Date   .  Complete heart block         s/p PPM   .  Sarcoidosis     .  Chronic diastolic heart failure     .  Coronary atherosclerosis of native coronary artery         DES to LAD 2/05, normal LVEF   .  Arthritis     .  Depression     .  Hyperlipidemia     .  Hypothyroidism     .  GERD (gastroesophageal reflux disease)     .  Glaucoma(365)     .  Cataracts, bilateral         Past Surgical History   Procedure  Laterality  Date   .  Anterior cervical discectomy           Cervical fusion   .  Lumbar laminectomy           L3 S1   .  Pacemaker insertion    2000, 2007       [employer]   .  Gallbladder surgery       .  Cataracts           bilateral   .  Colonoscopy       .  Upper gastrointestinal endoscopy           History       Social History   .  Marital Status:  Divorced        Spouse Name:  N/A       Number of Children:  3   .  Years of Education:  N/A       Occupational History   .  retired         Social History Main Topics   .  Smoking status:  Never Smoker    .  Smokeless tobacco:  Never Used   .  Alcohol Use:  No   .  Drug Use:  No   .  Sexually Active:  Not on file       Other Topics  Concern   .  Not on file       Social History Narrative   .  No narrative on file      Family History   Problem  Relation  Age of Onset   .  Colon cancer  Neg Hx     .  Diabetes  Son     .  Heart attack  Mother     .  Heart attack  Father        ROS: no nausea, vomiting; no fever, chills; no melena, hematochezia; no claudication   PHYSICAL EXAM: BP 130/77  Pulse 77  Ht 5\' 5"  (1.651 m)  Wt 161 lb (73.029 kg)  BMI 26.79 kg/m2  SpO2 96% GENERAL: 79 year old female; NAD HEENT: NCAT, PERRLA, EOMI; sclera clear; no xanthelasma NECK: palpable bilateral carotid pulses, no bruits; no JVD; no TM LUNGS: CTA bilaterally CARDIAC: RRR (S1, S2); no significant murmurs; no rubs or gallops ABDOMEN: soft, non-tender; intact BS EXTREMETIES: intact distal pulses; no significant peripheral edema SKIN: warm/dry; no obvious rash/lesions MUSCULOSKELETAL: no joint deformity NEURO: no focal deficit; NL affect   ASSESSMENT & PLAN:   1. CAD, NATIVE VESSEL: Unstable angina. Severe restenosis mid LAD stent. Will perform PCI of the LAD today.     Aralynn Brake 3:56 PM 03/01/2013

## 2013-03-01 NOTE — Interval H&P Note (Signed)
History and Physical Interval Note:  03/01/2013 3:57 PM  Cynthia [name]  has presented today for PCI of the LAD with the diagnosis of cad  The various methods of treatment have been discussed with the patient and family. After consideration of risks, benefits and other options for treatment, the patient has consented to  Procedure(s): PERCUTANEOUS CORONARY STENT INTERVENTION (PCI-S) (N/A) as a surgical intervention .  The patient's history has been reviewed, patient examined, no change in status, stable for surgery.  I have reviewed the patient's chart and labs.  Questions were answered to the patient's satisfaction.     MCALHANY,CHRISTOPHER

## 2013-03-01 NOTE — H&P (View-Only) (Signed)
Primary Cardiologist: Simona Huh, MD   HPI: Patient presents today for review of Lexiscan Cardiolite test results, ordered by Dr. Johney Frame during recent routine device clinic followup, for evaluation of CP and DOE. The results were reviewed by Dr. Diona Browner, who noted a small, reversible defect in the basal inferoseptal region; EF 79%. However, he also noted that this perfusion abnormality could be secondary to ventricular pacing.  Patient suggests an approximate two-month history of worsening DOE with associated chest pressure, improved with rest. She does not recall the symptoms associated with her prior PCI in 2005. She denies exertional CP when walking on level ground.   Of note, patient also had extensive blood work drawn, per Dr. Johney Frame, with results as follows:   - January 16: BUN 19, creatinine 1.1, potassium 4.6. NL CBC. TSH 0.8  No Known Allergies  Current Outpatient Prescriptions  Medication Sig Dispense Refill  . amLODipine (NORVASC) 10 MG tablet Take 10 mg by mouth daily.        Marland Kitchen aspirin 81 MG tablet Take 81 mg by mouth daily.        . dorzolamide-timolol (COSOPT) 22.3-6.8 MG/ML ophthalmic solution Place 1 drop into the left eye 2 (two) times daily.       . lansoprazole (PREVACID) 30 MG capsule Take 30 mg by mouth daily.        Marland Kitchen levothyroxine (SYNTHROID, LEVOTHROID) 88 MCG tablet Take 88 mcg by mouth daily.        . Multiple Vitamin (MULTIVITAMIN) tablet Take 1 tablet by mouth daily.      . sertraline (ZOLOFT) 100 MG tablet Take 50 mg by mouth daily.         No current facility-administered medications for this visit.    Past Medical History  Diagnosis Date  . Complete heart block     s/p PPM  . Sarcoidosis   . Chronic diastolic heart failure   . Coronary atherosclerosis of native coronary artery     DES to LAD 2/05, normal LVEF  . Arthritis   . Depression   . Hyperlipidemia   . Hypothyroidism   . GERD (gastroesophageal reflux disease)   . Glaucoma(365)   .  Cataracts, bilateral     Past Surgical History  Procedure Laterality Date  . Anterior cervical discectomy      Cervical fusion  . Lumbar laminectomy      L3 S1  . Pacemaker insertion  2000, 2007    [employer]  . Gallbladder surgery    . Cataracts      bilateral  . Colonoscopy    . Upper gastrointestinal endoscopy      History   Social History  . Marital Status: Divorced    Spouse Name: N/A    Number of Children: 3  . Years of Education: N/A   Occupational History  . retired    Social History Main Topics  . Smoking status: Never Smoker   . Smokeless tobacco: Never Used  . Alcohol Use: No  . Drug Use: No  . Sexually Active: Not on file   Other Topics Concern  . Not on file   Social History Narrative  . No narrative on file    Family History  Problem Relation Age of Onset  . Colon cancer Neg Hx   . Diabetes Son   . Heart attack Mother   . Heart attack Father     ROS: no nausea, vomiting; no fever, chills; no melena, hematochezia; no claudication  PHYSICAL EXAM:  BP 130/77  Pulse 77  Ht 5\' 5"  (1.651 m)  Wt 161 lb (73.029 kg)  BMI 26.79 kg/m2  SpO2 96% GENERAL: 79 year old female; NAD HEENT: NCAT, PERRLA, EOMI; sclera clear; no xanthelasma NECK: palpable bilateral carotid pulses, no bruits; no JVD; no TM LUNGS: CTA bilaterally CARDIAC: RRR (S1, S2); no significant murmurs; no rubs or gallops ABDOMEN: soft, non-tender; intact BS EXTREMETIES: intact distal pulses; no significant peripheral edema SKIN: warm/dry; no obvious rash/lesions MUSCULOSKELETAL: no joint deformity NEURO: no focal deficit; NL affect   EKG:    ASSESSMENT & PLAN:  CAD, NATIVE VESSEL Recommendation is to proceed with a diagnostic cardiac catheterization and possible PCI, to be scheduled in our [facility] lab this week. Patient suggests worsening exertional SOB with associated CP, over the past 2 months. She denies symptoms at rest. Results of the recent abnormal Lexiscan study, reviewed  by Dr. Diona Browner, were reviewed with both patient and Dr. Myrtis Ser, who approved this plan. Risks/benefits of procedure were discussed. Patient will be provided with NTG prescription. We will also reassess lipid status. Of note, patient cites prior intolerance to multiple statins, notably Lipitor.  AV BLOCK, COMPLETE Status post recent scheduled device check with Dr. Johney Frame, indicating NL PPM function.  HYPERTENSION, BENIGN Stable on current medication regimen  HYPERLIPIDEMIA-MIXED Reported intolerance to prior statin therapy. We'll reassess with FLP.    Gene Serpe, PAC

## 2013-03-01 NOTE — CV Procedure (Signed)
   Cardiac Catheterization Operative Report  CLATIE KESSEN [id number] 3/6/20144:38 PM VYAS,DHRUV B., MD  Procedure Performed:  1. PTCA/DES x 1 distal LAD    Operator: Verne Carrow, MD  Indication: 79 yo female with history of CAD with prior placement of a Taxus DES in the distal LAD (2.5 x 16 mm Taxus) in December 2005. Recent chest pain and dyspnea. (Class III angina). Diagnostic cath this am per Dr. Gala Romney with severe restenosis distal LAD stent.                                      Procedure Details: The risks, benefits, complications, treatment options, and expected outcomes were discussed with the patient. The patient and/or family concurred with the proposed plan, giving informed consent. The patient was brought to the cath lab from the outpatient cath lab. We loaded her with Plavix 600 mg po x 1 on the cath table. The patient was further sedated with Versed and Fentanyl. The right groin was prepped and draped in the usual manner. She had a 4 [place] sheath present in the right femoral artery and a 7 French sheath present in the right femoral vein. I changed the arterial sheath out for a 6 [place] system. I changed the venous sheath for another 7 [place] system. She was given a bolus of Angiomax and a drip was started. When the ACT was greater than 200, I engaged the left main artery with a XB LAD 3.5 guiding catheter. I then passed a BMW wire down the LAD. A 2.0 x 15 mm balloon was used to pre-dilate the severe restenosis in the distal LAD stent. I then deployed a 2.5 x  28 mm Promus Premier DES in the distal LAD covering all of the old stent. The stenosis was taken from 99% down to 0%.   There were no immediate complications. The patient was taken to the recovery area in stable condition.   Hemodynamic Findings: Central aortic pressure: 177/77  Impression: 1. Severe in stent restenosis distal LAD  2. Successful PTCA/DES x 1 distal LAD  Recommendations: She will need dual  anti-platelet therapy with ASA and Plavix for at least one year.        Complications:  None; patient tolerated the procedure well.

## 2013-03-01 NOTE — Progress Notes (Signed)
Site area: right groin  Site Prior to Removal:  Level 0  Pressure Applied For 20 MINUTES    Minutes Beginning at 1915  Manual:   yes  Patient Status During Pull:  Tolerated well.  Was mildly anxious before sheath pull and states felt SOB despite O2 sat 97% on ra so placed on [place] 2L during sheath pull.  Post Pull Groin Site:  Level 0  Post Pull Instructions Given:  yes  Post Pull Pulses Present:  yes  Dressing Applied:  yes  Comments:  See freq vs

## 2013-03-01 NOTE — CV Procedure (Signed)
Cardiac Cath Procedure Note  Indication: Dyspnea, CP, abnormal stress test  Procedures performed:  1) Right heart cathererization 2) Selective coronary angiography 3) Left heart catheterization 4) Left ventriculogram  Description of procedure:     The risks and indication of the procedure were explained. Consent was signed and placed on the chart. An appropriate timeout was taken prior to the procedure. The right groin was prepped and draped in the routine sterile fashion and anesthetized with 1% local lidocaine.   A 5 FR arterial sheath was placed in the right femoral artery using a modified Seldinger technique. Standard catheters including a JL4, JR4 and angled pigtail were used. All catheter exchanges were made over a wire. A 7 FR venous sheath was placed in the right femoral vein using a modified Seldinger technique. A standard Swan-Ganz catheter was used for the procedure.   Complications:  None apparent  Findings:  RA = 4 RV = 25/1/5 PA = 28/8 (16) PCW = 7 Fick cardiac output/index = 3.6/2.0 PVR = 2.5 [place] FA sat = 97% PA sat = 60%, 67%  Ao Pressure: 159/50 (97) LV Pressure: 162/2/18 There was no signficant gradient across the aortic valve on pullback.  Left main: Minimal plaque.  LAD:  Long 40% proximal. Moderate diffuse disease in midsection. There is a stent in mLAD with 99% ISR. Just after the stent there is a focal 70% lesion. Distal LAD is large and wraps apex. No significant stenosis distally.   LCX: Large dominant vessel. Very mild plaque  RCA: Moderate sized dominant vessel. Normal  LV-gram done in the RAO projection: Ejection fraction = 45-50% with HK of mid anterior wall  Assessment: 1. Normal RHC 2. 1V CAD with high grade in-stent restenosis of mLAD 3. EF 45-50% with mild anterior HK  Plan/Discussion:  PCI LAD today.  Arvilla Meres, MD 1:45 PM

## 2013-03-01 NOTE — Interval H&P Note (Signed)
History and Physical Interval Note:  03/01/2013 1:20 PM  Cynthia [name]  has presented today for surgery, with the diagnosis of abn stress test  The various methods of treatment have been discussed with the patient and family. After consideration of risks, benefits and other options for treatment, the patient has consented to  Procedure(s): JV LEFT HEART CATHETERIZATION WITH CORONARY ANGIOGRAM (N/A) as a surgical intervention .  The patient's history has been reviewed, patient examined, no change in status, stable for surgery.  I have reviewed the patient's chart and labs.  Questions were answered to the patient's satisfaction.     Elvera Almario

## 2013-03-01 NOTE — Progress Notes (Signed)
Transported to main cath lab holding area for stent placement.

## 2013-03-02 ENCOUNTER — Ambulatory Visit (HOSPITAL_COMMUNITY): Payer: PRIVATE HEALTH INSURANCE

## 2013-03-02 DIAGNOSIS — I10 Essential (primary) hypertension: Secondary | ICD-10-CM

## 2013-03-02 DIAGNOSIS — I255 Ischemic cardiomyopathy: Secondary | ICD-10-CM | POA: Diagnosis present

## 2013-03-02 DIAGNOSIS — I2589 Other forms of chronic ischemic heart disease: Secondary | ICD-10-CM

## 2013-03-02 DIAGNOSIS — I2510 Atherosclerotic heart disease of native coronary artery without angina pectoris: Secondary | ICD-10-CM

## 2013-03-02 DIAGNOSIS — R0789 Other chest pain: Secondary | ICD-10-CM

## 2013-03-02 DIAGNOSIS — R0602 Shortness of breath: Secondary | ICD-10-CM

## 2013-03-02 DIAGNOSIS — I442 Atrioventricular block, complete: Secondary | ICD-10-CM

## 2013-03-02 LAB — CBC
HCT: 39 % (ref 36.0–46.0)
Hemoglobin: 13 g/dL (ref 12.0–15.0)
MCH: 27.9 pg (ref 26.0–34.0)
MCV: 83.7 fL (ref 78.0–100.0)
RBC: 4.66 MIL/uL (ref 3.87–5.11)

## 2013-03-02 LAB — BASIC METABOLIC PANEL
CO2: 25 mEq/L (ref 19–32)
Calcium: 9.2 mg/dL (ref 8.4–10.5)
Creatinine, Ser: 1.34 mg/dL — ABNORMAL HIGH (ref 0.50–1.10)
Glucose, Bld: 108 mg/dL — ABNORMAL HIGH (ref 70–99)

## 2013-03-02 MED ORDER — CARVEDILOL 3.125 MG PO TABS
3.1250 mg | ORAL_TABLET | Freq: Two times a day (BID) | ORAL | Status: DC
  Administered 2013-03-02 – 2013-03-04 (×4): 3.125 mg via ORAL
  Filled 2013-03-02 (×7): qty 1

## 2013-03-02 MED ORDER — FUROSEMIDE 10 MG/ML IJ SOLN
20.0000 mg | Freq: Once | INTRAMUSCULAR | Status: AC
  Administered 2013-03-02: 20 mg via INTRAVENOUS
  Filled 2013-03-02: qty 2

## 2013-03-02 MED ORDER — ENSURE COMPLETE PO LIQD
237.0000 mL | Freq: Two times a day (BID) | ORAL | Status: DC
  Administered 2013-03-02 – 2013-03-04 (×4): 237 mL via ORAL
  Filled 2013-03-02 (×5): qty 237

## 2013-03-02 NOTE — Evaluation (Signed)
Physical Therapy Evaluation Patient Details Name: Cynthia [name] MRN: [id number] DOB: 20-Dec-1931 Today's Date: 03/02/2013 Time: 4098-1191 PT Time Calculation (min): 19 min  PT Assessment / Plan / Recommendation Clinical Impression  Pt admitted for cath and stent and reports living alone on 2nd floor with 16 steps to enter. Pt with several recent falls and no one to help with grocery shopping and she is having extreme difficulty getting groceries into house and preventing falls. Discussed with pt need to either move to first floor apartment or ALF pt not open to idea of ALF. Also discussed need for assist with groceries and other household activities in the interim until moved. Pt also needs to use a rollator at all times for balance and energy conservation. Pt worried about financial impact of these decisions and would like assist with knowing what help is available to her, made Rn aware who contaced CM/CSW. Will follow to maximize activity and balance to decrease fall risk.     PT Assessment  Patient needs continued PT services    Follow Up Recommendations  Home health PT    Does the patient have the potential to tolerate intense rehabilitation      Barriers to Discharge Decreased caregiver support      Equipment Recommendations  Other (comment) (rollator)    Recommendations for Other Services     Frequency Min 3X/week    Precautions / Restrictions Precautions Precautions: Fall   Pertinent Vitals/Pain No pain HR 66-74 and sats 95% on RA      Mobility  Bed Mobility Bed Mobility: Supine to Sit;Sit to Supine Supine to Sit: 6: Modified independent (Device/Increase time);HOB flat Sit to Supine: 6: Modified independent (Device/Increase time);HOB flat Transfers Transfers: Sit to Stand;Stand to Sit Sit to Stand: 6: Modified independent (Device/Increase time) Stand to Sit: 6: Modified independent (Device/Increase time) Ambulation/Gait Ambulation/Gait Assistance: 5:  Supervision Ambulation Distance (Feet): 200 Feet Assistive device: Rolling walker Ambulation/Gait Assistance Details: cueing for position in RW and safety. Pt educated for need for RW because without she furniture walks or reaches for hall rail Gait Pattern: Step-through pattern;Decreased stride length Gait velocity: decreased Stairs: Yes Stairs Assistance: 6: Modified independent (Device/Increase time) Stair Management Technique: One rail Right Number of Stairs: 15    Exercises     PT Diagnosis: Difficulty walking  PT Problem List: Decreased activity tolerance;Decreased balance;Decreased knowledge of use of DME PT Treatment Interventions: Gait training;DME instruction;Therapeutic activities;Balance training;Therapeutic exercise;Patient/family education   PT Goals Acute Rehab PT Goals PT Goal Formulation: With patient Time For Goal Achievement: 03/09/13 Potential to Achieve Goals: Good Pt will Ambulate: >150 feet;with modified independence;with least restrictive assistive device PT Goal: Ambulate - Progress: Goal set today Additional Goals Additional Goal #1: Pt will score >19 on DGI PT Goal: Additional Goal #1 - Progress: Goal set today  Visit Information  Last PT Received On: 03/02/13 Assistance Needed: +1    Subjective Data  Subjective: "I''ve had a few falls recently" Patient Stated Goal: go back to my apt   Prior Functioning  Home Living Lives With: Alone Type of Home: Apartment Home Access: Stairs to enter Entrance Stairs-Number of Steps: 16 Entrance Stairs-Rails: Right;Left Home Layout: One level Bathroom Shower/Tub: Engineer, manufacturing systems: Standard Home Adaptive Equipment: Straight cane Prior Function Level of Independence: Needs assistance Needs Assistance: Light Housekeeping Light Housekeeping: Moderate Able to Take Stairs?: Yes Driving: Yes Vocation: Retired Comments: Pt reports she has an Engineer, structural but it is on the other side of apartment  building  and easier to take stairs then walk to the elevator. Although recent fall on stairs carrying groceries due to fatigue Communication Communication: No difficulties    Cognition  Cognition Overall Cognitive Status: Appears within functional limits for tasks assessed/performed Arousal/Alertness: Awake/alert Orientation Level: Appears intact for tasks assessed Behavior During Session: [facility] for tasks performed    Extremity/Trunk Assessment Right Upper Extremity Assessment RUE ROM/Strength/Tone: [facility] for tasks assessed Left Upper Extremity Assessment LUE ROM/Strength/Tone: [facility] for tasks assessed Right Lower Extremity Assessment RLE ROM/Strength/Tone: [facility] for tasks assessed Left Lower Extremity Assessment LLE ROM/Strength/Tone: [facility] for tasks assessed Trunk Assessment Trunk Assessment: Normal   Balance    End of Session PT - End of Session Equipment Utilized During Treatment: Gait belt Activity Tolerance: Patient tolerated treatment well Patient left: in bed;with call bell/phone within reach Nurse Communication: Mobility status  GP     Toney Sang Beth 03/02/2013, 5:08 PM Delaney Meigs, PT [phone]

## 2013-03-02 NOTE — Progress Notes (Signed)
Provided private duty care agencies list to patient for assistance with meals and housekeeping. Camellia J. Lucretia Roers, RN, BSN, [employer] [phone]

## 2013-03-02 NOTE — Progress Notes (Signed)
INITIAL NUTRITION ASSESSMENT  Pt meets criteria for SEVERE MALNUTRITION in the context of chronic illness as evidenced by 8% weight loss x 3 months with estimated intake of < 75% of her estimated needs.  DOCUMENTATION CODES Per approved criteria  -Severe malnutrition in the context of chronic illness   INTERVENTION: 1. Ensure Complete po BID, each supplement provides 350 kcal and 13 grams of protein.  2. Discussed with pt importance of adequate nutrition, supplement/snack options for home.    NUTRITION DIAGNOSIS: Inadequate oral intake related to poor appetite as evidenced by 8% weight loss x 3 months with estimated intake of < 75% of her estimated needs.  Goal: Pt to meet >/= 90% of their estimated nutrition needs.   Monitor:  PO intake, weight, supplement acceptance  Reason for Assessment: Positive Malnutrition Screening Tool   79 y.o. female  Admitting Dx: <principal problem not specified>  ASSESSMENT: Pt reports that she has been losing weight for the last 3 months due to illness. Pt has only been eating 2 meals a day and less at those meals than usual during this time frame.  Case manager has given pt info on options for help at home with meals. Pt lives alone. Suspect this may be contributing to poor intake as well as pt not feeling well for the last 3 months.   Height: Ht Readings from Last 1 Encounters:  03/01/13 5\' 5"  (1.651 m)    Weight: Wt Readings from Last 1 Encounters:  03/02/13 156 lb 8.4 oz (71 kg)    Ideal Body Weight: 56.8 kg  % Ideal Body Weight: 125%  Wt Readings from Last 10 Encounters:  03/02/13 156 lb 8.4 oz (71 kg)  03/01/13 161 lb (73.029 kg)  03/02/13 156 lb 8.4 oz (71 kg)  03/01/13 161 lb (73.029 kg)  02/28/13 161 lb (73.029 kg)  02/05/13 162 lb (73.483 kg)  01/19/13 163 lb (73.936 kg)  07/20/12 162 lb 12.8 oz (73.846 kg)  02/29/12 168 lb (76.204 kg)  02/23/12 168 lb 6.4 oz (76.386 kg)    Usual Body Weight: 170 lb 3 months  ago  % Usual Body Weight: 92%  BMI:  Body mass index is 26.05 kg/(m^2).  Estimated Nutritional Needs: Kcal: 1400-1600 Protein: 70-85 grams Fluid: > 1.5 L/day  Skin: no issues noted  Nutrition Focused Physical Exam:  Subcutaneous Fat:  Orbital Region: WNL Upper Arm Region: WNL Thoracic and Lumbar Region: WNL  Muscle:  Temple Region: WNL Clavicle Bone Region: WNL Clavicle and Acromion Bone Region: WNL Scapular Bone Region: WNL Dorsal Hand: WNL Patellar Region: WNL Anterior Thigh Region: WNL Posterior Calf Region: WNL  Edema: not present    Diet Order: Cardiac Meal Completion: 50-100%   EDUCATION NEEDS: -No education needs identified at this time   Intake/Output Summary (Last 24 hours) at 03/02/13 1405 Last data filed at 03/02/13 1201  Gross per 24 hour  Intake    770 ml  Output   1801 ml  Net  -1031 ml    Last BM: PTA   Labs:   Recent Labs Lab 03/02/13 0746  NA 138  K 4.1  CL 103  CO2 25  BUN 14  CREATININE 1.34*  CALCIUM 9.2  GLUCOSE 108*    CBG (last 3)  No results found for this basename: GLUCAP,  in the last 72 hours  Scheduled Meds: . amLODipine  10 mg Oral Daily  . aspirin  81 mg Oral Daily  . carvedilol  3.125 mg Oral BID  WC  . clopidogrel  75 mg Oral Q breakfast  . dorzolamide-timolol  1 drop Left Eye BID  . levothyroxine  88 mcg Oral QAC breakfast  . sertraline  50 mg Oral Daily    Continuous Infusions:   Past Medical History  Diagnosis Date  . Complete heart block     s/p PPM  . Sarcoidosis   . Chronic diastolic heart failure   . Coronary atherosclerosis of native coronary artery     DES to LAD 2/05, normal LVEF  . Arthritis   . Depression   . Hyperlipidemia   . Hypothyroidism   . GERD (gastroesophageal reflux disease)   . Glaucoma(365)   . Cataracts, bilateral     Past Surgical History  Procedure Laterality Date  . Anterior cervical discectomy      Cervical fusion  . Lumbar laminectomy      L3 S1  .  Pacemaker insertion  2000, 2007    Medtronic  . Gallbladder surgery    . Cataracts      bilateral  . Colonoscopy    . Upper gastrointestinal endoscopy    . Percutaneous coronary stent intervention (pci-s) Left 03/01/13    Kendell Bane RD, LDN, CNSC [phone] Pager [phone] After Hours Pager

## 2013-03-02 NOTE — Care Management Note (Signed)
    Page 1 of 1   03/02/2013     11:29:18 AM   CARE MANAGEMENT NOTE 03/02/2013  Patient:  Cynthia [name], Cynthia [name]   Account Number:  [id number]  Date Initiated:  03/02/2013  Documentation initiated by:  [facility]  Subjective/Objective Assessment:   79 yo patient admitted today after outpatient cath     Action/Plan:   home alone   Anticipated DC Date:  03/03/2013   Anticipated DC Plan:  HOME/SELF CARE      DC Planning Services  CM consult      Choice offered to / List presented to:             Status of service:   Medicare Important Message given?   (If response is "NO", the following Medicare IM given date fields will be blank) Date Medicare IM given:   Date Additional Medicare IM given:    Discharge Disposition:    Per UR Regulation:    If discussed at Long Length of Stay Meetings, dates discussed:    Comments:  03/02/2013 @ 1115 Oletta Cohn, RN, BSN, NCM Consult to [employer] regarding pt needing assistance with meals and cleaning at home.  Will provide pt private duty listing.

## 2013-03-02 NOTE — Progress Notes (Signed)
CARDIAC REHAB PHASE I   PRE:  Rate/Rhythm: 70 paced    BP: sitting 117/55    SaO2:   MODE:  Ambulation: 180 ft   POST:  Rate/Rhythm: 77 paced    BP: sitting 132/54     SaO2:   Pt weak and DOE. She sts weakness is from being in hospital. Declined RW, wanted to hold to wall railings. Sts she uses cane at home when needed. Some dizziness walking as well. Pt lives alone in a retirement community. Has 16 stairs to go up to her apt. Sts 3 weeks ago she became so exhausted carrying groceries up stairs that she fell at top and laid there 30 min. Encouraged pt to get [facility] RN and [facility] PT to eval situation at home. Also encouraged to carry her cell phone all the time in her pocket. Pt sts she can't move downstairs and funds are tight for another apt. Discussed ed. Not appropriate for ex or CRPII. Will f/u in am. [id number]  Harriet Masson CES, ACSM

## 2013-03-02 NOTE — Progress Notes (Signed)
    SUBJECTIVE: She still feels weak and has dyspnea when walking around the room. No chest pain.   BP 158/75  Pulse 67  Temp(Src) 98.7 F (37.1 C) (Oral)  Resp 18  Wt 156 lb 8.4 oz (71 kg)  BMI 26.05 kg/m2  SpO2 98%  Intake/Output Summary (Last 24 hours) at 03/02/13 0809 Last data filed at 03/02/13 0400  Gross per 24 hour  Intake    650 ml  Output   1200 ml  Net   -550 ml    PHYSICAL EXAM General: Elderly female, in no acute distress. Alert and oriented x 3.  Psych:  Good affect, responds appropriately Neck: No JVD. No masses noted.  Lungs: Mild basilar crackles. No wheezes or rhonci noted.  Heart: RRR with no murmurs noted. Abdomen: Bowel sounds are present. Soft, non-tender.  Extremities: No lower extremity edema. Right groin with hematoma.   LABS: Basic Metabolic Panel: No results found for this basename: NA, K, CL, CO2, GLUCOSE, BUN, CREATININE, CALCIUM, MG, PHOS,  in the last 72 hours CBC:  Recent Labs  03/02/13 0746  WBC 6.4  HGB 13.0  HCT 39.0  MCV 83.7  PLT 190    Current Meds: . amLODipine  10 mg Oral Daily  . aspirin  81 mg Oral Daily  . atorvastatin  20 mg Oral q1800  . clopidogrel  75 mg Oral Q breakfast  . dorzolamide-timolol  1 drop Left Eye BID  . levothyroxine  88 mcg Oral QAC breakfast  . sertraline  50 mg Oral Daily   Cardiac cath 03/01/13:  Left main: Minimal plaque.  LAD: Long 40% proximal. Moderate diffuse disease in midsection. There is a stent in mLAD with 99% ISR. Just after the stent there is a focal 70% lesion. Distal LAD is large and wraps apex. No significant stenosis distally.  LCX: Large dominant vessel. Very mild plaque  RCA: Moderate sized dominant vessel. Normal  LV-gram done in the RAO projection: Ejection fraction = 45-50% with HK of mid anterior wall  PCI of LAD with 2.5 x 28 mm Promus Premier DES placed over old stent in distal vessel.    ASSESSMENT AND PLAN:  1. CAD/Unstable angina: Pt admitted yesterday after  outpatient cath showed severe distal LAD stent restenosis. Now s/p placement of a DES in the distal LAD, completely covering the old stent. She will need dual anti-platelet therapy with ASA and Plavix for at least one year. She is intolerant of statins. Will start low dose beta blocker today. Her blood pressure has been elevated.   2. Ischemic cardiomyopathy: She has not been on a beta blocker. Will start Coreg 3.125 mg po BID. I would also recommend an Ace-inhibitor before discharge if her renal function remains stable. Will reassess BMET today and again in am.   3. Complete heart block: Her Medtronic pacemaker is followed by Dr. Johney Frame. Telemetry and EKG this am with random pacemaker spikes. This could represent artifact. Will ask the [employer]tr[employer] team to evaluate her pacemaker. Last f/u with Dr. Johney Frame 02/05/13 with normal pacemaker function at that time.   4. Dyspnea: Will check CXR today. She has mild crackles on exam. Will give one dose of IV lasix this am. Follow up chest x-ray and reassess volume status in am.    5. Dispo: Anticipate d/c home tomorrow if stable.   MCALHANY,CHRISTOPHER  3/7/20148:09 AM

## 2013-03-03 LAB — BASIC METABOLIC PANEL
BUN: 19 mg/dL (ref 6–23)
Chloride: 101 mEq/L (ref 96–112)
Creatinine, Ser: 1.47 mg/dL — ABNORMAL HIGH (ref 0.50–1.10)
GFR calc Af Amer: 38 mL/min — ABNORMAL LOW (ref >90)
Glucose, Bld: 116 mg/dL — ABNORMAL HIGH (ref 70–99)
Potassium: 3.6 mEq/L (ref 3.5–5.1)

## 2013-03-03 LAB — TROPONIN I: Troponin I: <0.3 ng/mL (ref <0.30)

## 2013-03-03 NOTE — Progress Notes (Signed)
CARDIAC REHAB PHASE I   PRE:  Rate/Rhythm: 70 paced  BP:  Supine:   Sitting: 118/60  Standing:    SaO2: 94 RA  MODE:  Ambulation: 300 ft   POST:  Rate/Rhythm: 76 BP:  Supine:   Sitting: 12/60  Standing:    SaO2: 94 Ra Pt assisted to side of bed for ambulation. Pt complained of dizziness, bp checked 118/66.  Dizziness resolved able to ambulate x 1 assist with use of Rolling walker.  Pt would benefit from having a rolling walker for home use. Pt denied any cp or sob but complained of feeling weak.  Pt to recliner, call bell in place. [id number]  Arna Medici

## 2013-03-03 NOTE — Progress Notes (Signed)
Patient: Cynthia [name] / Admit Date: 03/01/2013 / Date of Encounter: 03/03/2013, 7:33 AM   Subjective  Had 30 minutes of chest pressure this AM, similar to the pain she came in with possibly a/w some sweating. She did not tell anyone about this. Pain resolved without intervention and was not made worse by anything. She wonders if it was from laying on her side. Feels generally weak. Still has SOB but better than yesterday. Mild cough, nonproductive.   Objective   Telemetry: V paced Physical Exam: Filed Vitals:   03/03/13 0722  BP: 136/48  Pulse: 67  Temp: 98.4 F (36.9 C)  Resp: 20   General: Well developed, well nourished WF in no acute distress. Head: Normocephalic, atraumatic, sclera non-icteric, no xanthomas, nares are without discharge. Neck: Negative for carotid bruits. JVD not elevated. Lungs: Clear bilaterally to auscultation without wheezes, rales, or rhonchi. Breathing is unlabored. Heart: RRR S1 S2 with 2/6 SEM RUSB without rubs or gallops.  Abdomen: Soft, non-tender, non-distended with normoactive bowel sounds. No hepatomegaly. No rebound/guarding. No obvious abdominal masses. Msk:  Strength and tone appear normal for age. Extremities: No clubbing or cyanosis. No edema.  Distal pedal pulses are 2+ and equal bilaterally. R groin with some local ecchymosis, but no hematoma or bruit. Neuro: Alert and oriented X 3. Moves all extremities spontaneously. Psych:  Responds to questions appropriately with a normal affect.    Intake/Output Summary (Last 24 hours) at 03/03/13 0733 Last data filed at 03/03/13 0700  Gross per 24 hour  Intake    700 ml  Output   1551 ml  Net   -851 ml    Inpatient Medications:  . amLODipine  10 mg Oral Daily  . aspirin  81 mg Oral Daily  . carvedilol  3.125 mg Oral BID WC  . clopidogrel  75 mg Oral Q breakfast  . dorzolamide-timolol  1 drop Left Eye BID  . feeding supplement  237 mL Oral BID BM  . levothyroxine  88 mcg Oral QAC breakfast  .  sertraline  50 mg Oral Daily    Labs:  Recent Labs  03/02/13 0746  NA 138  K 4.1  CL 103  CO2 25  GLUCOSE 108*  BUN 14  CREATININE 1.34*  CALCIUM 9.2    Recent Labs  03/02/13 0746  WBC 6.4  HGB 13.0  HCT 39.0  MCV 83.7  PLT 190    Radiology/Studies:  Dg Chest 2 View 03/02/2013  *RADIOLOGY REPORT*  Clinical Data: Cough, weakness, coronary artery disease post coronary stenting, sarcoidosis, heart failure, hyperlipidemia  CHEST - 2 VIEW  Comparison: 02/28/2013  Findings: Left subclavian sequential transvenous pacemaker leads project at right atrium and right ventricle. Coronary arterial stent noted. Normal heart size, mediastinal contours, and pulmonary vascularity. Persistent opacities in right lung likely subtle infiltrate. Remaining lungs grossly clear. No pleural effusion or pneumothorax. Mild elevation of right diaphragm persists. Prior cervical spine fusion. Osseous demineralization.  IMPRESSION: Persistent subtle right lung opacities question infiltrate.   Original Report Authenticated By: Ulyses Southward, M.D.      Assessment and Plan  1. CAD/Unstable angina: Pt admitted yesterday after outpatient cath showed severe distal LAD stent restenosis. Now s/p placement of a DES in the distal LAD, completely covering the old stent. She will need dual anti-platelet therapy with ASA and Plavix for at least one year. She is intolerant of statins. BB started yesterday. Had recurrent CP this AM ~30 minutes resolving spontaneously.  2. Ischemic cardiomyopathy EF  45-50% by cath 03/01/13: Started on BB this admission. Await BMET this AM and consider ACEI.  3. H/o complete heart block: Her Medtronic pacemaker is followed by Dr. Johney Frame. Telemetry and EKG yesterday with random pacer spikes - Medtronic report indicates atrial undersensing, and device reprogrammed. No episodes noted upon initial interrogation.   4. Dyspnea: Normal RHC on 3/6. Received 1 dose of 20mg  IV Lasix yesterday. CXR with  ?infiltrate but afebrile with normal WBC, slight cough. Will d/w MD.  5. Acute renal insufficiency - Cr by prior labs runs 1.27 - 1.55. BMET pending this AM.  6. Systolic murmur - consider echocardiogram.  Signed, Dayna Dunn PA-C As above; patient with recurrent chest pain last PM for 30 minutes; recycle enzymes; transfer to telemetry; DC in AM if stable and no recurrent symptoms assuming enzymes neg.  Recheck BMET in am. Cynthia [name] 9:13 AM

## 2013-03-04 ENCOUNTER — Other Ambulatory Visit: Payer: Self-pay | Admitting: Physician Assistant

## 2013-03-04 ENCOUNTER — Encounter (HOSPITAL_COMMUNITY): Payer: Self-pay | Admitting: Physician Assistant

## 2013-03-04 DIAGNOSIS — R011 Cardiac murmur, unspecified: Secondary | ICD-10-CM

## 2013-03-04 LAB — BASIC METABOLIC PANEL
CO2: 28 mEq/L (ref 19–32)
Chloride: 99 mEq/L (ref 96–112)
Glucose, Bld: 113 mg/dL — ABNORMAL HIGH (ref 70–99)
Potassium: 4.7 mEq/L (ref 3.5–5.1)
Sodium: 139 mEq/L (ref 135–145)

## 2013-03-04 MED ORDER — PANTOPRAZOLE SODIUM 40 MG PO TBEC: 40.0000 mg | DELAYED_RELEASE_TABLET | Freq: Every day | ORAL | Status: DC

## 2013-03-04 MED ORDER — CARVEDILOL 3.125 MG PO TABS: 3.1250 mg | ORAL_TABLET | Freq: Two times a day (BID) | ORAL | Status: DC

## 2013-03-04 MED ORDER — AMLODIPINE BESYLATE 5 MG PO TABS: 5.0000 mg | ORAL_TABLET | Freq: Every day | ORAL | Status: DC

## 2013-03-04 MED ORDER — AMLODIPINE BESYLATE 5 MG PO TABS
5.0000 mg | ORAL_TABLET | Freq: Every day | ORAL | Status: DC
  Administered 2013-03-04: 5 mg via ORAL

## 2013-03-04 MED ORDER — CLOPIDOGREL BISULFATE 75 MG PO TABS: 75.0000 mg | ORAL_TABLET | Freq: Every day | ORAL | Status: DC

## 2013-03-04 NOTE — Discharge Summary (Signed)
Discharge Summary   Patient ID: Cynthia [name] MRN: [id number], DOB/AGE: [birth year] 79 y.o. Admit date: 03/01/2013 D/C date:     03/04/2013  Primary Cardiologist: Diona Browner  Primary Discharge Diagnoses:  1. CAD/unstable angina - s/p DES to LAD 03/01/13 for severe ISR - history: DES to LAD in 2005 2. ICM EF 45-50% by cath 03/01/13 3. H/o complete heart block s/p PPM with atrial undersensing this admission - device reprogrammed 4. Renal insufficiency, discharge Cr 1.45 5. Weakness/deconditioning - for HHPT, rolling walker 6. HTN 7. Abnormal CXR - to f/u PCP 8. Chronic diastolic CHF  Secondary Discharge Diagnoses:  1. Sarcoidosis 2. Arthritis 3. Depression 4. Hyperlipidemia 5. Hypothyroidism 6. GERD 7. Glaucoma 8. Cataracts, bilateral   Hospital Course: [name] is a 79 y/o F with history of HTN, CAD, CHB s/p pacemaker who presented to the office 02/28/13 in followup of an abnormal Lexiscan cardiolite done for CP and SOB. This showed a small, reversible defect in the basal inferoseptal region; EF 79%. Cardiac cath was recommended. She underwent this procedure 03/01/13 demonstrating: Assessment:  1. Normal RHC  2. 1V CAD with high grade in-stent restenosis of mLAD  3. EF 45-50% with mild anterior HK She subsequently had DES placed to the LAD. She was started on low dose BB. She did complain of some dyspnea post-procedure and was treated with a dose of IV Lasix. She had a recurrent brief episode of chest pain yesterday without acute EKG changes. Follow up enzymes were normal. Per notes, recent bloodwork in January showed Cr of 1.1 but prior labwork also indicates Cr of 1.28-1.7. Her Cr was 1.3 on 3/5, up to 1.47 this admission but stable today at 1.45. Because of this as well as borderline low BPs, we have not started ACEI inhibitor. Also, she had recently abnormal CXR showing possible RLL opacity but was afebrile, did not report substantial cough, and had normal WBC. She will be instructed  to f/u PCP for further eval. She felt somewhat weak yesterday, thus was kept for observation and seen by PT/cardiac rehab. HHPT and rolling walker were recommended. Today she is feeling better. Dr. Patty Sermons has seen and examined her today and feels she is stable for discharge. We will obtain outpatient echo to evaluate her systolic murmur.    Discharge Vitals: Blood pressure 104/66, pulse 72, temperature 98 F (36.7 C), temperature source Oral, resp. rate 18, height 5\' 4"  (1.626 m), weight 161 lb (73.029 kg), SpO2 95.00%.  Labs: Lab Results  Component Value Date   WBC 6.4 03/02/2013   HGB 13.0 03/02/2013   HCT 39.0 03/02/2013   MCV 83.7 03/02/2013   PLT 190 03/02/2013    Recent Labs Lab 03/04/13 0605  NA 139  K 4.7  CL 99  CO2 28  BUN 21  CREATININE 1.45*  CALCIUM 9.5  GLUCOSE 113*    Recent Labs  03/03/13 0936 03/03/13 1525 03/03/13 2047  TROPONINI <0.30 <0.30 <0.30     Diagnostic Studies/Procedures   1. Cardiac catheterization this admission, please see full report and above for summary. 2. Dg Chest 2 View 03/02/2013  *RADIOLOGY REPORT*  Clinical Data: Cough, weakness, coronary artery disease post coronary stenting, sarcoidosis, heart failure, hyperlipidemia  CHEST - 2 VIEW  Comparison: 02/28/2013  Findings: Left subclavian sequential transvenous pacemaker leads project at right atrium and right ventricle. Coronary arterial stent noted. Normal heart size, mediastinal contours, and pulmonary vascularity. Persistent opacities in right lung likely subtle infiltrate. Remaining lungs grossly clear. No pleural  effusion or pneumothorax. Mild elevation of right diaphragm persists. Prior cervical spine fusion. Osseous demineralization.  IMPRESSION: Persistent subtle right lung opacities question infiltrate.   Original Report Authenticated By: Ulyses Southward, M.D.     Discharge Medications     Medication List    STOP taking these medications       lansoprazole 30 MG capsule  Commonly  known as:  PREVACID      TAKE these medications       amLODipine 5 MG tablet  Commonly known as:  NORVASC  Take 1 tablet (5 mg total) by mouth daily.     aspirin EC 81 MG tablet  Take 81 mg by mouth daily.     carvedilol 3.125 MG tablet  Commonly known as:  COREG  Take 1 tablet (3.125 mg total) by mouth 2 (two) times daily with a meal.     clopidogrel 75 MG tablet  Commonly known as:  PLAVIX  Take 1 tablet (75 mg total) by mouth daily with breakfast.     dorzolamide-timolol 22.3-6.8 MG/ML ophthalmic solution  Commonly known as:  COSOPT  Place 1 drop into the left eye 2 (two) times daily.     levothyroxine 88 MCG tablet  Commonly known as:  SYNTHROID, LEVOTHROID  Take 88 mcg by mouth daily.     multivitamin tablet  Take 1 tablet by mouth daily.     nitroGLYCERIN 0.4 MG SL tablet  Commonly known as:  NITROSTAT  Place 1 tablet (0.4 mg total) under the tongue every 5 (five) minutes as needed for chest pain.     pantoprazole 40 MG tablet  Commonly known as:  PROTONIX  Take 1 tablet (40 mg total) by mouth daily.     sertraline 100 MG tablet  Commonly known as:  ZOLOFT  Take 50 mg by mouth daily.        Disposition   The patient will be discharged in stable condition to home.     Discharge Orders   Future Orders Complete By Expires     Diet - low sodium heart healthy  As directed     Increase activity slowly  As directed     Comments:      No driving for 2 days. No lifting over 5 lbs for 1 week. No sexual activity for 1 week. Keep procedure site clean & dry. If you notice increased pain, swelling, bleeding or pus, call/return!  You may shower, but no soaking baths/hot tubs/pools for 1 week.      Follow-up Information   Follow up with Nona Dell, MD. (Our office will call you to schedule a follow-up appointment)    Contact information:   [address] [place] [address] [phone]       Follow up with VYAS,DHRUV B., MD. (To further evaluate  your abnormal chest x-ray)    Contact information:   [address] [place] [address] [phone]         Duration of Discharge Encounter: Greater than 30 minutes including physician and PA time.  Signed, Dontavis Tschantz PA-C 03/04/2013, 11:21 AM

## 2013-03-04 NOTE — Progress Notes (Signed)
Patient: Cynthia [name] / Admit Date: 03/01/2013 / Date of Encounter: 03/04/2013, 9:15 AM   Subjective  Feels better today. No chest pain, SOB. Less weak. Seen by cardiac rehab and PT with recommendation for HHPT and rolling walker.   Objective   Telemetry: V paced. Rare PVC Physical Exam: Filed Vitals:   03/04/13 0457  BP: 104/66  Pulse: 72  Temp: 98 F (36.7 C)  Resp: 18   General: Well developed, well nourished WF in no acute distress.  Head: Normocephalic, atraumatic, sclera non-icteric, no xanthomas, nares are without discharge.  Neck: Negative for carotid bruits. JVD not elevated.  Lungs: Clear bilaterally to auscultation without wheezes, rales, or rhonchi. Breathing is unlabored.  Heart: RRR S1 S2 with 2/6 SEM LUSB without rubs or gallops.  Abdomen: Soft, non-tender, non-distended with normoactive bowel sounds. No hepatomegaly. No rebound/guarding. No obvious abdominal masses.  Msk: Strength and tone appear normal for age.  Extremities: No clubbing or cyanosis. No edema. Distal pedal pulses are 2+ and equal bilaterally. R groin with some local ecchymosis, but no hematoma, oozing, or bruit. Neuro: [name] and oriented X 3. Moves all extremities spontaneously.  Psych: Responds to questions appropriately with a normal affect.     Intake/Output Summary (Last 24 hours) at 03/04/13 0915 Last data filed at 03/03/13 1700  Gross per 24 hour  Intake    370 ml  Output    440 ml  Net    -70 ml    Inpatient Medications:  . amLODipine  10 mg Oral Daily  . aspirin  81 mg Oral Daily  . carvedilol  3.125 mg Oral BID WC  . clopidogrel  75 mg Oral Q breakfast  . dorzolamide-timolol  1 drop Left Eye BID  . feeding supplement  237 mL Oral BID BM  . levothyroxine  88 mcg Oral QAC breakfast  . sertraline  50 mg Oral Daily    Labs:  Recent Labs  03/03/13 0630 03/04/13 0605  NA 139 139  K 3.6 4.7  CL 101 99  CO2 26 28  GLUCOSE 116* 113*  BUN 19 21  CREATININE 1.47* 1.45*    CALCIUM 9.6 9.5    Recent Labs  03/02/13 0746  WBC 6.4  HGB 13.0  HCT 39.0  MCV 83.7  PLT 190    Recent Labs  03/03/13 0936 03/03/13 1525 03/03/13 2047  TROPONINI <0.30 <0.30 <0.30   Radiology/Studies:  Dg Chest 2 View  03/02/2013  *RADIOLOGY REPORT*  Clinical Data: Cough, weakness, coronary artery disease post coronary stenting, sarcoidosis, heart failure, hyperlipidemia  CHEST - 2 VIEW  Comparison: 02/28/2013  Findings: Left subclavian sequential transvenous pacemaker leads project at right atrium and right ventricle. Coronary arterial stent noted. Normal heart size, mediastinal contours, and pulmonary vascularity. Persistent opacities in right lung likely subtle infiltrate. Remaining lungs grossly clear. No pleural effusion or pneumothorax. Mild elevation of right diaphragm persists. Prior cervical spine fusion. Osseous demineralization.  IMPRESSION: Persistent subtle right lung opacities question infiltrate.   Original Report Authenticated By: Ulyses Southward, M.D.      Assessment and Plan  1. CAD/Unstable angina: Pt admitted yesterday after outpatient cath showed severe distal LAD stent restenosis. Now s/p placement of a DES in the distal LAD, completely covering the old stent. She will need dual anti-platelet therapy with ASA and Plavix for at least one year. She is intolerant of statins. Continue BB. Neg enz yesterday after recurrent CP.   2. Ischemic cardiomyopathy EF 45-50% by cath 03/01/13:  Started on BB this admission. Would hold off on ACEI given BP, can readdress as OP.  3. H/o complete heart block: Her Medtronic pacemaker is followed by Dr. Johney Frame. Telemetry and EKG this admit with random pacer spikes - Medtronic report indicates atrial undersensing, and device reprogrammed. No episodes noted upon initial interrogation.   4. Dyspnea: Normal RHC on 3/6. Received 20mg  IV Lasix on 3/7. CXR with ?infiltrate but afebrile with normal WBC, slight cough. The decision has been made not  to place on antibiotic therapy. Will confirm with MD this AM. F/u PCP as OP.  5. Probable CKD stage III - Cr by prior labs runs 1.27 - 1.55. Stable.  6. Systolic murmur - consider OP echocardiogram.   7. Weakness -  Pt reports this has been the case for about 6 months, slightly better today. Will order rolling walker and HHPT and discuss med regimen with MD.   Signed, Ronie Spies PA-C Patient seen. Doing well without chest pain or dyspnea today. Lungs clear to PandA .  Heart grade 2/6 systolic ejection murmur. OKay for discharge with outpatient followup as noted.

## 2013-03-04 NOTE — Clinical Social Work Psychosocial (Signed)
Clinical Social Work Department BRIEF PSYCHOSOCIAL ASSESSMENT 03/04/2013  Patient:  Cynthia [name], Cynthia [name]     Account Number:  [id number]     Admit date:  03/01/2013  Clinical Social Worker:  Oswaldo Done  Date/Time:  03/04/2013 11:15 AM  Referred by:  RN  Date Referred:  03/02/2013 Referred for  Other - See comment   Other Referral:   Interview type:  Patient Other interview type:    PSYCHOSOCIAL DATA Living Status:  ALONE Admitted from facility:   Level of care:   Primary support name:  [facility] Primary support relationship to patient:  CHILD, ADULT Degree of support available:   Fair    CURRENT CONCERNS Current Concerns  Other - See comment   Other Concerns:    SOCIAL WORK ASSESSMENT / PLAN CSW consulted to provide patient with information on "living options". PT noted patient lives on 2nd floor of apartment building and has had frequent falls. PT recommended Home Health PT, but talking to patient about going to Assisted Living Facility. Patient expressed concern that ALF is too costly. CSW met with patient at bedside. States she has two adult sons who live in [place]. One son calls her monthly while the other, Jorja Loa, works in [place] where the patient lives and will come see her once a month. Daughter in law has been talking to her about moving to a nursing facility but the patient does not want to do this. Patient stating when she returns home she is going to talk to her landlady about moving to a first floor apartment. Patient stating she is interested in long term personal care services. CSW provided patient with information on AD[facility] of [facility], including Home Care Block [place], [place] program and application for [employer]. CSW also provided patient with a list of Assisted Living Facilities in [place]. Patient stating she still wishes to return home upon discharge. Will talk with her doctor about completing application for Personal Care  Services.   Assessment/plan status:  Information/Referral to [employer] Other assessment/ plan:   Information/referral to community resources:   [facility] of [facility], Home Care Block [place], [place] Program, Personal Care Services, ALF List for [facility]    PATIENT'S/FAMILY'S RESPONSE TO PLAN OF CARE: Patient thanked CSW for information.  RN CM will arrange Home Health upon discharge. CSW signing off.      Ricke Hey, [place] [phone] (weekend)

## 2013-03-04 NOTE — Progress Notes (Signed)
   CARE MANAGEMENT NOTE 03/04/2013  Patient:  Cynthia [name], Cynthia [name]   Account Number:  [id number]  Date Initiated:  03/02/2013  Documentation initiated by:  [facility]  Subjective/Objective Assessment:   79 yo patient admitted today after outpatient cath     Action/Plan:   home alone   Anticipated DC Date:  03/03/2013   Anticipated DC Plan:  HOME/SELF CARE      DC Planning Services  CM consult      [facility] Choice  HOME HEALTH   Choice offered to / List presented to:  C-1 Patient   DME arranged  Levan Hurst      DME agency  [facility]     HH arranged  HH-2 PT      [facility] agency  [facility]   Status of service:  Completed, signed off Medicare Important Message given?   (If response is "NO", the following Medicare IM given date fields will be blank) Date Medicare IM given:   Date Additional Medicare IM given:    Discharge Disposition:  HOME W HOME HEALTH SERVICES  Per UR Regulation:    If discussed at Long Length of Stay Meetings, dates discussed:    Comments: 03/04/2013 1200 Offered choice for [facility].  Pt requesting [facility] for HH. Contacted [facility] for [facility] PT for scheduled dc today. Isidoro Donning RN CCM Case Mgmt phone [phone]   03/02/2013 @ 1115 Oletta Cohn, RN, BSN, NCM Consult to [employer] regarding pt needing assistance with meals and cleaning at home.  Will provide pt private duty listing.

## 2013-03-05 ENCOUNTER — Other Ambulatory Visit: Payer: Self-pay | Admitting: *Deleted

## 2013-03-05 DIAGNOSIS — R0602 Shortness of breath: Secondary | ICD-10-CM

## 2013-03-05 MED FILL — Dextrose Inj 5%: INTRAVENOUS | Qty: 50 | Status: AC

## 2013-03-06 ENCOUNTER — Encounter: Payer: Self-pay | Admitting: Physician Assistant

## 2013-03-07 ENCOUNTER — Telehealth: Payer: Self-pay | Admitting: Cardiology

## 2013-03-07 NOTE — Telephone Encounter (Signed)
Called to Eunice Blase [phone], after speaking with Dr. Diona Browner.  HHPT authorized for 4 visits.  She will fax authorization form for signature.

## 2013-03-07 NOTE — Telephone Encounter (Signed)
Cynthia [name] with [facility] requesting verbal authorization for evaluation of physicial therapy, would like to see for 4 visits total.  Per Eunice Blase she received physical therapy evaluate and treat from the hospital ([facility]) signed by Ronie Spies, PA.    [phone] [place]  Researching-found [facility] d/c note had the following under assessment: ----------She will be instructed to f/u PCP for further eval. She felt somewhat weak yesterday, thus was kept for observation and seen by PT/cardiac rehab. HHPT and rolling walker were recommended. Today she is feeling better. Dr. Patty Sermons has seen and examined her today and feels she is stable for discharge. We will obtain outpatient echo to evaluate her systolic murmur. ---------

## 2013-03-09 ENCOUNTER — Telehealth: Payer: Self-pay | Admitting: *Deleted

## 2013-03-09 DIAGNOSIS — R0602 Shortness of breath: Secondary | ICD-10-CM

## 2013-03-09 NOTE — Telephone Encounter (Signed)
Notes Recorded by Lesle Chris, LPN on 1/61/0960 at 11:36 AM Patient notified. States she will go as soon as she can.

## 2013-03-09 NOTE — Telephone Encounter (Signed)
Message copied by Lesle Chris on Fri Mar 09, 2013 11:38 AM ------      Message from: Prescott Parma C      Created: Wed Mar 07, 2013 10:46 AM       Abnormal CXR suggestive of possible pneumonia. Recommend f/u 2v CXR before post hosp f/u with me on 3/24. ------

## 2013-03-14 ENCOUNTER — Other Ambulatory Visit (INDEPENDENT_AMBULATORY_CARE_PROVIDER_SITE_OTHER): Payer: PRIVATE HEALTH INSURANCE

## 2013-03-14 ENCOUNTER — Other Ambulatory Visit: Payer: Self-pay

## 2013-03-14 ENCOUNTER — Telehealth: Payer: Self-pay | Admitting: Cardiology

## 2013-03-14 DIAGNOSIS — R0602 Shortness of breath: Secondary | ICD-10-CM

## 2013-03-14 MED ORDER — ROLLER WALKER MISC: 1.0000 | Status: DC

## 2013-03-14 NOTE — Telephone Encounter (Signed)
Left message for patient to call office.  

## 2013-03-14 NOTE — Telephone Encounter (Signed)
Eunice Blase, PT at [facility] called and said that the order for the walker that Dr.McDowell wrote needs to say Rolling with Seat.  Please call Debbie to discuss details.

## 2013-03-16 ENCOUNTER — Encounter: Payer: Self-pay | Admitting: Physician Assistant

## 2013-03-16 ENCOUNTER — Telehealth: Payer: Self-pay | Admitting: *Deleted

## 2013-03-16 ENCOUNTER — Ambulatory Visit (INDEPENDENT_AMBULATORY_CARE_PROVIDER_SITE_OTHER): Payer: PRIVATE HEALTH INSURANCE | Admitting: Physician Assistant

## 2013-03-16 VITALS — BP 136/83 | HR 70 | Ht 65.0 in | Wt 159.1 lb

## 2013-03-16 DIAGNOSIS — E785 Hyperlipidemia, unspecified: Secondary | ICD-10-CM

## 2013-03-16 DIAGNOSIS — I442 Atrioventricular block, complete: Secondary | ICD-10-CM

## 2013-03-16 DIAGNOSIS — N289 Disorder of kidney and ureter, unspecified: Secondary | ICD-10-CM

## 2013-03-16 DIAGNOSIS — I38 Endocarditis, valve unspecified: Secondary | ICD-10-CM | POA: Insufficient documentation

## 2013-03-16 DIAGNOSIS — I2510 Atherosclerotic heart disease of native coronary artery without angina pectoris: Secondary | ICD-10-CM

## 2013-03-16 DIAGNOSIS — J189 Pneumonia, unspecified organism: Secondary | ICD-10-CM

## 2013-03-16 DIAGNOSIS — I10 Essential (primary) hypertension: Secondary | ICD-10-CM

## 2013-03-16 MED ORDER — CARVEDILOL 6.25 MG PO TABS: 6.2500 mg | ORAL_TABLET | Freq: Two times a day (BID) | ORAL | Status: DC

## 2013-03-16 MED ORDER — ATORVASTATIN CALCIUM 80 MG PO TABS: 80.0000 mg | ORAL_TABLET | Freq: Every evening | ORAL | Status: DC

## 2013-03-16 NOTE — Assessment & Plan Note (Signed)
Followup with Dr. Johney Frame, as scheduled

## 2013-03-16 NOTE — Assessment & Plan Note (Signed)
Essentially resolved, by recent followup CXR. Patient instructed to follow with primary M.D., for worsening cough or development of fever.

## 2013-03-16 NOTE — Assessment & Plan Note (Signed)
Mild AS and MR, by recent followup echocardiogram. Continue to monitor clinically.

## 2013-03-16 NOTE — Assessment & Plan Note (Signed)
Recent preadmission workup notable for LDL 262. Initiate statin therapy with Lipitor 80 mg daily. Reassess lipid status in 12 weeks.

## 2013-03-16 NOTE — Assessment & Plan Note (Signed)
We'll order followup labs

## 2013-03-16 NOTE — Progress Notes (Addendum)
Primary Cardiologist: Simona Huh, MD   HPI: Post hospital followup from [facility], status post PCI with DES of LAD, secondary to high grade in-stent restenosis.  I initially referred her for a right/left heart catheterization, following recent mildly abnormal nuclear imaging study. Cardiac catheterization revealed 1v CAD with high-grade ISR of mid LAD, successfully treated with PTCA/DES; Normal RHC; EF 45-50%, mild anterior HK  She was then referred for a post hospital followup echocardiogram, for further evaluation of a systolic murmur. This yielded EF 60-65%, with mild AS and mild MR  Of note, preadmission workup notable for possible pneumonia. Post hospital followup CXR, March 17, yielded evidence of resolving pneumonia.  Clinically, she notes palpable improvement. She reports no further exertional chest pressure, and much less exertional dyspnea.  She denies complications of R groin incision site.  Allergies  Allergen Reactions  . Statins     Current Outpatient Prescriptions  Medication Sig Dispense Refill  . amLODipine (NORVASC) 5 MG tablet Take 1 tablet (5 mg total) by mouth daily.  30 tablet  6  . aspirin EC 81 MG tablet Take 81 mg by mouth daily.      . carvedilol (COREG) 3.125 MG tablet Take 1 tablet (3.125 mg total) by mouth 2 (two) times daily with a meal.  60 tablet  6  . clopidogrel (PLAVIX) 75 MG tablet Take 1 tablet (75 mg total) by mouth daily with breakfast.  30 tablet  11  . dorzolamide-timolol (COSOPT) 22.3-6.8 MG/ML ophthalmic solution Place 1 drop into the left eye 2 (two) times daily.       Marland Kitchen levothyroxine (SYNTHROID, LEVOTHROID) 88 MCG tablet Take 88 mcg by mouth daily.       . Misc. Devices (ROLLER WALKER) MISC 1 each by Does not apply route as directed. Rolling walker with seat  1 each  0  . Multiple Vitamin (MULTIVITAMIN) tablet Take 1 tablet by mouth daily.      . nitroGLYCERIN (NITROSTAT) 0.4 MG SL tablet Place 1 tablet (0.4 mg total) under the tongue every 5  (five) minutes as needed for chest pain.  25 tablet  3  . pantoprazole (PROTONIX) 40 MG tablet Take 1 tablet (40 mg total) by mouth daily.  30 tablet  1  . sertraline (ZOLOFT) 100 MG tablet Take 50 mg by mouth daily.         No current facility-administered medications for this visit.    Past Medical History  Diagnosis Date  . Complete heart block     a. s/p Medtronic PPM  . Sarcoidosis   . Chronic diastolic heart failure   . Coronary atherosclerosis of native coronary artery     a. DES to LAD 2/05. b. DES to LAD 02/2013 for ISR.  Marland Kitchen Arthritis   . Depression   . Hyperlipidemia   . Hypothyroidism   . GERD (gastroesophageal reflux disease)   . Glaucoma(365)   . Cataracts, bilateral   . Ischemic cardiomyopathy     a. EF 45-50% by cath 03/01/13.    Past Surgical History  Procedure Laterality Date  . Anterior cervical discectomy      Cervical fusion  . Lumbar laminectomy      L3 S1  . Pacemaker insertion  2000, 2007    Medtronic  . Gallbladder surgery    . Cataracts      bilateral  . Colonoscopy    . Upper gastrointestinal endoscopy    . Percutaneous coronary stent intervention (pci-s) Left 03/01/13    History  Social History  . Marital Status: Divorced    Spouse Name: N/A    Number of Children: 3  . Years of Education: N/A   Occupational History  . retired    Social History Main Topics  . Smoking status: Never Smoker   . Smokeless tobacco: Never Used  . Alcohol Use: No  . Drug Use: No  . Sexually Active: No   Other Topics Concern  . Not on file   Social History Narrative  . No narrative on file    Family History  Problem Relation Age of Onset  . Colon cancer Neg Hx   . Diabetes Son   . Heart attack Mother   . Heart attack Father     ROS: no nausea, vomiting; no fever, chills; no melena, hematochezia; no claudication  PHYSICAL EXAM: BP 136/83  Pulse 70  Ht 5\' 5"  (1.651 m)  Wt 159 lb 1.9 oz (72.176 kg)  BMI 26.48 kg/m2 GENERAL: 79 year old  female; NAD  HEENT: NCAT, PERRLA, EOMI; sclera clear; no xanthelasma  NECK: palpable bilateral carotid pulses, no bruits; no JVD; no TM  LUNGS: CTA bilaterally  CARDIAC: RRR (S1, S2); soft, grade 2/6 systolic ejection murmur at base; no rubs or gallops  ABDOMEN: soft, non-tender; intact BS  EXTREMETIES: no significant peripheral edema  SKIN: warm/dry; no obvious rash/lesions  MUSCULOSKELETAL: no joint deformity  NEURO: no focal deficit; NL affect    EKG:    ASSESSMENT & PLAN:  CAD, NATIVE VESSEL Continue DAPT with ASA/Plavix for at least one year. Increase carvedilol to 6.25 twice a day.  HYPERLIPIDEMIA-MIXED Recent preadmission workup notable for LDL 262. Initiate statin therapy with Lipitor 80 mg daily. Reassess lipid status in 12 weeks.  HTN (hypertension) We'll reassess following up titration of carvedilol.  Valvular heart disease Mild AS and MR, by recent followup echocardiogram. Continue to monitor clinically.  Renal insufficiency We'll order followup labs  AV BLOCK, COMPLETE Followup with Dr. Johney Frame, as scheduled  Pneumonia Essentially resolved, by recent followup CXR. Patient instructed to follow with primary M.D., for worsening cough or development of fever.    Gene Shanyia Stines, PAC

## 2013-03-16 NOTE — Assessment & Plan Note (Signed)
We'll reassess following up titration of carvedilol 

## 2013-03-16 NOTE — Telephone Encounter (Signed)
Notes Recorded by Prescott Parma, PA-C on 03/14/2013 at 11:20 AM Resolving pneumonia  Patient notified of above and stable echo.  Patient already has eph scheduled for 3/24 with Gene Serpe, PA.

## 2013-03-16 NOTE — Patient Instructions (Addendum)
   Begin Lipitor 80mg  every evening  Lab today for BMET  Office will notify of results   Increase Coreg to 6.25mg  twice a day    Labs in 12 weeks for fasting lipid and liver panel - can do just prior to next office visit Continue all other current medications. Follow up in  3 months

## 2013-03-16 NOTE — Assessment & Plan Note (Signed)
Continue DAPT with ASA/Plavix for at least one year. Increase carvedilol to 6.25 twice a day.

## 2013-03-16 NOTE — Telephone Encounter (Signed)
Message copied by Lesle Chris on Fri Mar 16, 2013 10:06 AM ------      Message from: Cynthia [name]      Created: Thu Mar 15, 2013  8:32 AM       NL LVF; mild AS and MR ------

## 2013-03-19 ENCOUNTER — Encounter: Payer: PRIVATE HEALTH INSURANCE | Admitting: Physician Assistant

## 2013-03-21 ENCOUNTER — Encounter: Payer: Self-pay | Admitting: *Deleted

## 2013-06-20 ENCOUNTER — Ambulatory Visit (INDEPENDENT_AMBULATORY_CARE_PROVIDER_SITE_OTHER): Payer: PRIVATE HEALTH INSURANCE | Admitting: Cardiology

## 2013-06-20 ENCOUNTER — Encounter: Payer: Self-pay | Admitting: Cardiology

## 2013-06-20 VITALS — BP 113/68 | HR 64 | Ht 64.0 in | Wt 150.8 lb

## 2013-06-20 DIAGNOSIS — E785 Hyperlipidemia, unspecified: Secondary | ICD-10-CM

## 2013-06-20 DIAGNOSIS — I10 Essential (primary) hypertension: Secondary | ICD-10-CM

## 2013-06-20 DIAGNOSIS — Z79899 Other long term (current) drug therapy: Secondary | ICD-10-CM

## 2013-06-20 DIAGNOSIS — I2510 Atherosclerotic heart disease of native coronary artery without angina pectoris: Secondary | ICD-10-CM

## 2013-06-20 DIAGNOSIS — I2589 Other forms of chronic ischemic heart disease: Secondary | ICD-10-CM

## 2013-06-20 DIAGNOSIS — Z950 Presence of cardiac pacemaker: Secondary | ICD-10-CM

## 2013-06-20 DIAGNOSIS — I255 Ischemic cardiomyopathy: Secondary | ICD-10-CM

## 2013-06-20 NOTE — Assessment & Plan Note (Signed)
Continue high-dose Lipitor, followup FLP and LFT arranged.

## 2013-06-20 NOTE — Progress Notes (Signed)
Clinical Summary Cynthia [name] is an 79 y.o.female last seen by Mr. Serpe PAC in March of this year. She reports no recurring anginal symptoms. States that she has a Comptroller with her 3 hours each day. Does not do any heavy lifting or chores. She reports compliance with her medications, no major bleeding problems.  Echocardiogram in March of this year demonstrated moderate LVH with LVEF 60-65%, grade 1 diastolic dysfunction, moderately calcified aortic leaflets with mild aortic regurgitation, mild mitral regurgitation with mild SAM, mild left atrial enlargement.  Lipid panel from March of this year showed cholesterol 371, triglycerides 349, HDL 39, LDL 262. High-dose Lipitor was recommended. She states that she has been taking this, tolerating it so far. No followup labs as yet.  Allergies  Allergen Reactions  . Statins     Current Outpatient Prescriptions  Medication Sig Dispense Refill  . amLODipine (NORVASC) 5 MG tablet Take 1 tablet (5 mg total) by mouth daily.  30 tablet  6  . aspirin EC 81 MG tablet Take 81 mg by mouth daily.      Marland Kitchen atorvastatin (LIPITOR) 80 MG tablet Take 1 tablet (80 mg total) by mouth every evening.  30 tablet  6  . carvedilol (COREG) 6.25 MG tablet Take 1 tablet (6.25 mg total) by mouth 2 (two) times daily with a meal.  60 tablet  6  . clopidogrel (PLAVIX) 75 MG tablet Take 1 tablet (75 mg total) by mouth daily with breakfast.  30 tablet  11  . dorzolamide-timolol (COSOPT) 22.3-6.8 MG/ML ophthalmic solution Place 1 drop into the left eye 2 (two) times daily.       Marland Kitchen levothyroxine (SYNTHROID, LEVOTHROID) 88 MCG tablet Take 88 mcg by mouth daily.       . Misc. Devices (ROLLER WALKER) MISC 1 each by Does not apply route as directed. Rolling walker with seat  1 each  0  . Multiple Vitamin (MULTIVITAMIN) tablet Take 1 tablet by mouth daily.      . nitroGLYCERIN (NITROSTAT) 0.4 MG SL tablet Place 1 tablet (0.4 mg total) under the tongue every 5 (five) minutes as needed for  chest pain.  25 tablet  3  . pantoprazole (PROTONIX) 40 MG tablet Take 1 tablet (40 mg total) by mouth daily.  30 tablet  1  . sertraline (ZOLOFT) 100 MG tablet Take 50 mg by mouth daily.         No current facility-administered medications for this visit.    Past Medical History  Diagnosis Date  . Complete heart block     a. s/p Medtronic PPM  . Sarcoidosis   . Chronic diastolic heart failure   . Coronary atherosclerosis of native coronary artery     a. DES to LAD 2/05. b. DES to LAD 02/2013 for ISR.  Marland Kitchen Arthritis   . Depression   . Hyperlipidemia   . Hypothyroidism   . GERD (gastroesophageal reflux disease)   . Glaucoma   . Cataracts, bilateral   . Ischemic cardiomyopathy     a. EF 45-50% by cath 03/01/13.    Social History Ms. Crume reports that she has never smoked. She has never used smokeless tobacco. Ms. Moorehead reports that she does not drink alcohol.  Review of Systems Negative except as outlined.  Physical Examination Filed Vitals:   06/20/13 1427  BP: 113/68  Pulse: 64   Filed Weights   06/20/13 1427  Weight: 150 lb 12.8 oz (68.402 kg)    Comfortable at rest.  HEENT: Conjunctiva and lids normal, oropharynx moist mucosa.  Neck: Supple, no carotid bruits, no thyromegaly.  Lungs: Clear to auscultation, nonlabored.  Cardiac: Regular rate and rhythm.  Thorax: Stable device pocket site.  Extremities: No pitting edema.    Problem List and Plan   CAD, NATIVE VESSEL Symptomatically stable status post DES to the LAD in March 2014 secondary to ISR. She continues on medical therapy.  Cardiomyopathy, ischemic LVEF recently in the range of 60-65%.  HYPERLIPIDEMIA-MIXED Continue high-dose Lipitor, followup FLP and LFT arranged.  HYPERTENSION, BENIGN Blood pressure well controlled today.  Status post placement of cardiac pacemaker History of complete heart block, Medtronic device, followed by Dr. Johney Frame.    [name], M.D., F.A.C.C.

## 2013-06-20 NOTE — Assessment & Plan Note (Signed)
Symptomatically stable status post DES to the LAD in March 2014 secondary to ISR. She continues on medical therapy.

## 2013-06-20 NOTE — Assessment & Plan Note (Signed)
LVEF recently in the range of 60-65%.

## 2013-06-20 NOTE — Assessment & Plan Note (Signed)
History of complete heart block, Medtronic device, followed by Dr. Johney Frame.

## 2013-06-20 NOTE — Patient Instructions (Addendum)
Your physician recommends that you schedule a follow-up appointment in: 4 months. You will receive a reminder letter in the mail in about 1-2 months reminding you to call and schedule your appointment. If you don't receive this letter, please contact our office. Your physician recommends that you continue on your current medications as directed. Please refer to the Current Medication list given to you today. Your physician recommends that you return for a FASTING lipid/liver profile as soon as possible this week. Please have this done at [facility].

## 2013-06-20 NOTE — Assessment & Plan Note (Signed)
Blood pressure well-controlled today. 

## 2013-07-13 ENCOUNTER — Telehealth: Payer: Self-pay | Admitting: *Deleted

## 2013-07-13 NOTE — Telephone Encounter (Signed)
Patient informed. 

## 2013-07-13 NOTE — Telephone Encounter (Signed)
Message copied by Eustace Moore on Fri Jul 13, 2013 10:41 AM ------      Message from: MCDOWELL, Illene Bolus      Created: Thu Jul 12, 2013  9:12 AM       Reviewed. LFTs are normal. Cholesterol coming down quite nicely from an initially very high level. Total cholesterol 202, LDL 120. Continue high-dose Lipitor. ------

## 2013-08-29 ENCOUNTER — Ambulatory Visit (INDEPENDENT_AMBULATORY_CARE_PROVIDER_SITE_OTHER): Payer: PRIVATE HEALTH INSURANCE | Admitting: *Deleted

## 2013-08-29 DIAGNOSIS — I442 Atrioventricular block, complete: Secondary | ICD-10-CM

## 2013-08-29 LAB — PACEMAKER DEVICE OBSERVATION
AL AMPLITUDE: 1 mv
AL THRESHOLD: 0.75 V
BAMS-0001: 150 {beats}/min
DEVICE MODEL PM: 208001
RV LEAD IMPEDENCE PM: 971 Ohm

## 2013-08-29 NOTE — Progress Notes (Signed)
Pacer check in clinic. Normal device function. Battery longevity 5 years. No episodes recorded since last check. ROV in 6 mths w/JA in [place] office.

## 2013-10-01 ENCOUNTER — Encounter: Payer: Self-pay | Admitting: Internal Medicine

## 2013-11-12 ENCOUNTER — Other Ambulatory Visit: Payer: Self-pay | Admitting: Physician Assistant

## 2014-02-04 ENCOUNTER — Other Ambulatory Visit: Payer: Self-pay | Admitting: Physician Assistant

## 2014-02-04 NOTE — Telephone Encounter (Signed)
Please contact office for appointment.

## 2014-02-11 ENCOUNTER — Encounter: Payer: Self-pay | Admitting: Internal Medicine

## 2014-02-11 ENCOUNTER — Ambulatory Visit (INDEPENDENT_AMBULATORY_CARE_PROVIDER_SITE_OTHER): Payer: PRIVATE HEALTH INSURANCE | Admitting: Internal Medicine

## 2014-02-11 VITALS — BP 138/82 | HR 75 | Ht 65.0 in | Wt 159.8 lb

## 2014-02-11 DIAGNOSIS — I442 Atrioventricular block, complete: Secondary | ICD-10-CM

## 2014-02-11 DIAGNOSIS — I2589 Other forms of chronic ischemic heart disease: Secondary | ICD-10-CM

## 2014-02-11 DIAGNOSIS — I2510 Atherosclerotic heart disease of native coronary artery without angina pectoris: Secondary | ICD-10-CM

## 2014-02-11 DIAGNOSIS — I255 Ischemic cardiomyopathy: Secondary | ICD-10-CM

## 2014-02-11 NOTE — Progress Notes (Signed)
PCP:  Donzetta Sprung, MD Primary Cardiologist:  Dr Diona Browner  The patient presents today for routine electrophysiology followup. She continues to have fatigue.  Her primary limitation is with back pain chronically. Today, she denies symptoms of palpitations, lower extremity edema, dizziness, presyncope, syncope, or neurologic sequela.  The patient feels that she is tolerating medications without difficulties and is otherwise without complaint today.   Past Medical History  Diagnosis Date  . Complete heart block     a. s/p Medtronic PPM  . Sarcoidosis   . Chronic diastolic heart failure   . Coronary atherosclerosis of native coronary artery     a. DES to LAD 2/05. b. DES to LAD 02/2013 for ISR.  Marland Kitchen Arthritis   . Depression   . Hyperlipidemia   . Hypothyroidism   . GERD (gastroesophageal reflux disease)   . Glaucoma   . Cataracts, bilateral   . Ischemic cardiomyopathy     a. EF 45-50% by cath 03/01/13.   Past Surgical History  Procedure Laterality Date  . Anterior cervical discectomy      Cervical fusion  . Lumbar laminectomy      L3 S1  . Pacemaker insertion  2000, 2007    Medtronic  . Gallbladder surgery    . Cataracts      bilateral  . Colonoscopy    . Upper gastrointestinal endoscopy    . Percutaneous coronary stent intervention (pci-s) Left 03/01/13    Current Outpatient Prescriptions  Medication Sig Dispense Refill  . aspirin EC 81 MG tablet Take 81 mg by mouth daily.      Marland Kitchen atorvastatin (LIPITOR) 80 MG tablet TAKE ONE TABLET BY MOUTH IN THE EVENING  30 tablet  6  . carvedilol (COREG) 6.25 MG tablet TAKE ONE TABLET BY MOUTH TWICE DAILY WITH MEALS  60 tablet  3  . clopidogrel (PLAVIX) 75 MG tablet Take 1 tablet (75 mg total) by mouth daily with breakfast.  30 tablet  11  . dorzolamide-timolol (COSOPT) 22.3-6.8 MG/ML ophthalmic solution Place 1 drop into the left eye 2 (two) times daily.       . hydrochlorothiazide (HYDRODIURIL) 25 MG tablet Take 25 mg by mouth.       .  levothyroxine (SYNTHROID, LEVOTHROID) 88 MCG tablet Take 88 mcg by mouth daily.       . meclizine (ANTIVERT) 25 MG tablet Take 25 mg by mouth 2 (two) times daily as needed.       . Misc. Devices (ROLLER WALKER) MISC 1 each by Does not apply route as directed. Rolling walker with seat  1 each  0  . Multiple Vitamin (MULTIVITAMIN) tablet Take 1 tablet by mouth daily.      . nitroGLYCERIN (NITROSTAT) 0.4 MG SL tablet Place 1 tablet (0.4 mg total) under the tongue every 5 (five) minutes as needed for chest pain.  25 tablet  3  . pantoprazole (PROTONIX) 40 MG tablet Take 1 tablet (40 mg total) by mouth daily.  30 tablet  1  . sertraline (ZOLOFT) 100 MG tablet Take 50 mg by mouth daily.         No current facility-administered medications for this visit.    Allergies  Allergen Reactions  . Statins     History   Social History  . Marital Status: Divorced    Spouse Name: N/A    Number of Children: 3  . Years of Education: N/A   Occupational History  . retired    Social History Main Topics  .  Smoking status: Never Smoker   . Smokeless tobacco: Never Used  . Alcohol Use: No  . Drug Use: No  . Sexual Activity: No   Other Topics Concern  . Not on file   Social History Narrative  . No narrative on file    Physical Exam: Filed Vitals:   02/11/14 0932  BP: 138/82  Pulse: 75  Height: 5\' 5"  (1.651 m)  Weight: 159 lb 12.8 oz (72.485 kg)  SpO2: 95%    GEN- The patient is well appearing, alert and oriented x 3 today.   Head- normocephalic, atraumatic Eyes-  Sclera clear, conjunctiva pink Ears- hearing intact Oropharynx- clear Neck- supple, no JVP Lymph- no cervical lymphadenopathy Lungs- Clear to ausculation bilaterally, normal work of breathing Chest- pacemaker pocket is well healed Heart- Regular rate and rhythm, no murmurs, rubs or gallops, PMI not laterally displaced GI- soft, NT, ND, + BS Extremities- no clubbing, cyanosis, or edema  Pacemaker interrogation- reviewed  in detail today,  See PACEART report  Assessment and Plan: 1. Complete heart block Normal pacemaker function See Pace Art report No changes today  2. CAD Stable No change required today   Follow-up with Dr Diona BrownerMcDowell as scheduled  Carelink I will see in 12 months

## 2014-02-11 NOTE — Patient Instructions (Signed)
Carelink remote check on 05-15-2014  Your physician recommends that you schedule a follow-up appointment in: 1 year with Dr. Johney FrameAllred. You should receive a letter in the mail in 10 months. If you do not receive this letter by December 2015 call our office to schedule this appointment.   Your physician recommends that you continue on your current medications as directed. Please refer to the Current Medication list given to you today.

## 2014-02-13 LAB — MDC_IDC_ENUM_SESS_TYPE_INCLINIC
Battery Remaining Longevity: 53 mo
Battery Voltage: 2.79 V
Brady Statistic AP VP Percent: 42 %
Brady Statistic AS VP Percent: 58 %
Lead Channel Impedance Value: 1068 Ohm
Lead Channel Impedance Value: 446 Ohm
Lead Channel Pacing Threshold Amplitude: 0.75 V
Lead Channel Sensing Intrinsic Amplitude: 0.7 mV
Lead Channel Setting Pacing Amplitude: 2 V
Lead Channel Setting Pacing Pulse Width: 0.4 ms
Lead Channel Setting Sensing Sensitivity: 2.8 mV
MDC IDC MSMT BATTERY IMPEDANCE: 1099 Ohm
MDC IDC MSMT LEADCHNL RA PACING THRESHOLD AMPLITUDE: 0.5 V
MDC IDC MSMT LEADCHNL RA PACING THRESHOLD PULSEWIDTH: 0.4 ms
MDC IDC MSMT LEADCHNL RV PACING THRESHOLD PULSEWIDTH: 0.4 ms
MDC IDC PG SERIAL: 208001
MDC IDC SESS DTM: 20150216144537
MDC IDC SET LEADCHNL RV PACING AMPLITUDE: 2.5 V
MDC IDC STAT BRADY AP VS PERCENT: 0 %
MDC IDC STAT BRADY AS VS PERCENT: 0 %

## 2014-03-25 ENCOUNTER — Other Ambulatory Visit (HOSPITAL_COMMUNITY): Payer: Self-pay | Admitting: Physician Assistant

## 2014-05-15 ENCOUNTER — Ambulatory Visit (INDEPENDENT_AMBULATORY_CARE_PROVIDER_SITE_OTHER): Payer: PRIVATE HEALTH INSURANCE | Admitting: *Deleted

## 2014-05-15 ENCOUNTER — Telehealth: Payer: Self-pay | Admitting: Cardiology

## 2014-05-15 DIAGNOSIS — I442 Atrioventricular block, complete: Secondary | ICD-10-CM

## 2014-05-15 NOTE — Telephone Encounter (Signed)
Spoke with pt and reminded pt of remote transmission that is due today. Pt verbalized understanding.   

## 2014-05-16 ENCOUNTER — Other Ambulatory Visit: Payer: Self-pay | Admitting: Internal Medicine

## 2014-05-16 ENCOUNTER — Encounter: Payer: Self-pay | Admitting: Internal Medicine

## 2014-05-16 ENCOUNTER — Encounter: Payer: Self-pay | Admitting: Cardiology

## 2014-05-16 DIAGNOSIS — I442 Atrioventricular block, complete: Secondary | ICD-10-CM

## 2014-05-16 NOTE — Progress Notes (Signed)
Remote pacemaker transmission.   

## 2014-05-22 ENCOUNTER — Telehealth: Payer: Self-pay | Admitting: Internal Medicine

## 2014-05-22 NOTE — Telephone Encounter (Signed)
New Message:  Pt is calling to make sure we received her transmission. States she sent it on 5/21 and not 5/20. Pt wants a call back from our device clinic to confirm they received it.

## 2014-05-23 NOTE — Telephone Encounter (Signed)
Spoke w/pt---transmission received.

## 2014-05-26 LAB — MDC_IDC_ENUM_SESS_TYPE_REMOTE
Brady Statistic AP VP Percent: 41.3 %
Brady Statistic AS VP Percent: 58.7 %
Brady Statistic AS VS Percent: 0.1 %
Implantable Pulse Generator Serial Number: 208001
Lead Channel Impedance Value: 992 Ohm
Lead Channel Pacing Threshold Amplitude: 0.625 V
Lead Channel Pacing Threshold Amplitude: 0.75 V
Lead Channel Pacing Threshold Pulse Width: 0.4 ms
Lead Channel Pacing Threshold Pulse Width: 0.4 ms
Lead Channel Setting Pacing Pulse Width: 0.4 ms
MDC IDC MSMT BATTERY REMAINING LONGEVITY: 48 mo
MDC IDC MSMT BATTERY VOLTAGE: 2.79 V
MDC IDC MSMT LEADCHNL RA IMPEDANCE VALUE: 415 Ohm
MDC IDC MSMT LEADCHNL RA SENSING INTR AMPL: 0.25 mV
MDC IDC SET LEADCHNL RA PACING AMPLITUDE: 2 V
MDC IDC SET LEADCHNL RV PACING AMPLITUDE: 2.5 V
MDC IDC SET LEADCHNL RV SENSING SENSITIVITY: 2.8 mV
MDC IDC STAT BRADY AP VS PERCENT: 0.1 %

## 2014-06-07 ENCOUNTER — Encounter: Payer: Self-pay | Admitting: Cardiology

## 2014-07-29 ENCOUNTER — Other Ambulatory Visit: Payer: Self-pay | Admitting: *Deleted

## 2014-07-29 MED ORDER — CARVEDILOL 6.25 MG PO TABS: 6.2500 mg | ORAL_TABLET | Freq: Two times a day (BID) | ORAL | Status: DC

## 2014-08-19 ENCOUNTER — Telehealth: Payer: Self-pay | Admitting: Cardiology

## 2014-08-19 ENCOUNTER — Encounter: Payer: PRIVATE HEALTH INSURANCE | Admitting: *Deleted

## 2014-08-19 NOTE — Telephone Encounter (Signed)
Spoke with pt and reminded pt of remote transmission that is due today. Pt verbalized understanding.   

## 2014-08-20 ENCOUNTER — Encounter: Payer: Self-pay | Admitting: Cardiology

## 2014-09-25 ENCOUNTER — Other Ambulatory Visit: Payer: Self-pay | Admitting: Cardiology

## 2014-09-26 ENCOUNTER — Other Ambulatory Visit: Payer: Self-pay | Admitting: *Deleted

## 2014-09-26 MED ORDER — CARVEDILOL 6.25 MG PO TABS: 6.2500 mg | ORAL_TABLET | Freq: Two times a day (BID) | ORAL | Status: DC

## 2014-09-26 NOTE — Addendum Note (Signed)
Addended by: Eustace MooreANDERSON, LYDIA M on: 09/26/2014 08:59 AM   Modules accepted: Orders

## 2014-09-26 NOTE — Telephone Encounter (Signed)
Prescription refill for carvedilol this morning was sent back with quantity of 30 and should have been 60. Correct quantity given to pharmacist over the phone.

## 2014-10-09 ENCOUNTER — Encounter: Payer: Self-pay | Admitting: Cardiology

## 2014-11-15 ENCOUNTER — Other Ambulatory Visit: Payer: Self-pay | Admitting: Cardiology

## 2014-12-05 ENCOUNTER — Encounter (HOSPITAL_COMMUNITY): Payer: Self-pay | Admitting: Cardiovascular Disease

## 2015-01-01 DIAGNOSIS — E782 Mixed hyperlipidemia: Secondary | ICD-10-CM | POA: Diagnosis not present

## 2015-01-01 DIAGNOSIS — D860 Sarcoidosis of lung: Secondary | ICD-10-CM | POA: Diagnosis not present

## 2015-01-01 DIAGNOSIS — I10 Essential (primary) hypertension: Secondary | ICD-10-CM | POA: Diagnosis not present

## 2015-01-01 DIAGNOSIS — E039 Hypothyroidism, unspecified: Secondary | ICD-10-CM | POA: Diagnosis not present

## 2015-01-01 DIAGNOSIS — F325 Major depressive disorder, single episode, in full remission: Secondary | ICD-10-CM | POA: Diagnosis not present

## 2015-01-10 ENCOUNTER — Other Ambulatory Visit: Payer: Self-pay | Admitting: Cardiology

## 2015-01-10 DIAGNOSIS — T1490 Injury, unspecified: Secondary | ICD-10-CM | POA: Diagnosis not present

## 2015-01-17 DIAGNOSIS — R262 Difficulty in walking, not elsewhere classified: Secondary | ICD-10-CM | POA: Diagnosis not present

## 2015-01-17 DIAGNOSIS — W1809XA Striking against other object with subsequent fall, initial encounter: Secondary | ICD-10-CM | POA: Diagnosis not present

## 2015-01-17 DIAGNOSIS — F329 Major depressive disorder, single episode, unspecified: Secondary | ICD-10-CM | POA: Diagnosis not present

## 2015-01-17 DIAGNOSIS — T8189XA Other complications of procedures, not elsewhere classified, initial encounter: Secondary | ICD-10-CM | POA: Diagnosis not present

## 2015-01-17 DIAGNOSIS — E876 Hypokalemia: Secondary | ICD-10-CM | POA: Diagnosis not present

## 2015-01-17 DIAGNOSIS — M6281 Muscle weakness (generalized): Secondary | ICD-10-CM | POA: Diagnosis not present

## 2015-01-17 DIAGNOSIS — S32019A Unspecified fracture of first lumbar vertebra, initial encounter for closed fracture: Secondary | ICD-10-CM | POA: Diagnosis not present

## 2015-01-17 DIAGNOSIS — R410 Disorientation, unspecified: Secondary | ICD-10-CM | POA: Diagnosis not present

## 2015-01-17 DIAGNOSIS — I2510 Atherosclerotic heart disease of native coronary artery without angina pectoris: Secondary | ICD-10-CM | POA: Diagnosis not present

## 2015-01-17 DIAGNOSIS — S3992XA Unspecified injury of lower back, initial encounter: Secondary | ICD-10-CM | POA: Diagnosis not present

## 2015-01-17 DIAGNOSIS — M545 Low back pain: Secondary | ICD-10-CM | POA: Diagnosis not present

## 2015-01-17 DIAGNOSIS — D869 Sarcoidosis, unspecified: Secondary | ICD-10-CM | POA: Diagnosis not present

## 2015-01-17 DIAGNOSIS — S299XXA Unspecified injury of thorax, initial encounter: Secondary | ICD-10-CM | POA: Diagnosis not present

## 2015-01-17 DIAGNOSIS — I350 Nonrheumatic aortic (valve) stenosis: Secondary | ICD-10-CM | POA: Diagnosis not present

## 2015-01-17 DIAGNOSIS — Z955 Presence of coronary angioplasty implant and graft: Secondary | ICD-10-CM | POA: Diagnosis not present

## 2015-01-17 DIAGNOSIS — S22080A Wedge compression fracture of T11-T12 vertebra, initial encounter for closed fracture: Secondary | ICD-10-CM | POA: Diagnosis not present

## 2015-01-17 DIAGNOSIS — I10 Essential (primary) hypertension: Secondary | ICD-10-CM | POA: Diagnosis not present

## 2015-01-17 DIAGNOSIS — S0990XA Unspecified injury of head, initial encounter: Secondary | ICD-10-CM | POA: Diagnosis not present

## 2015-01-17 DIAGNOSIS — Y92192 Bathroom in other specified residential institution as the place of occurrence of the external cause: Secondary | ICD-10-CM | POA: Diagnosis not present

## 2015-01-17 DIAGNOSIS — K219 Gastro-esophageal reflux disease without esophagitis: Secondary | ICD-10-CM | POA: Diagnosis not present

## 2015-01-17 DIAGNOSIS — S22080G Wedge compression fracture of T11-T12 vertebra, subsequent encounter for fracture with delayed healing: Secondary | ICD-10-CM | POA: Diagnosis not present

## 2015-01-17 DIAGNOSIS — E039 Hypothyroidism, unspecified: Secondary | ICD-10-CM | POA: Diagnosis not present

## 2015-01-17 DIAGNOSIS — R55 Syncope and collapse: Secondary | ICD-10-CM | POA: Diagnosis not present

## 2015-01-18 DIAGNOSIS — J984 Other disorders of lung: Secondary | ICD-10-CM | POA: Diagnosis not present

## 2015-01-18 DIAGNOSIS — I951 Orthostatic hypotension: Secondary | ICD-10-CM | POA: Diagnosis not present

## 2015-01-18 DIAGNOSIS — J8410 Pulmonary fibrosis, unspecified: Secondary | ICD-10-CM | POA: Diagnosis not present

## 2015-01-18 DIAGNOSIS — S22000A Wedge compression fracture of unspecified thoracic vertebra, initial encounter for closed fracture: Secondary | ICD-10-CM | POA: Diagnosis not present

## 2015-01-19 ENCOUNTER — Encounter: Payer: Self-pay | Admitting: Internal Medicine

## 2015-01-20 ENCOUNTER — Encounter: Payer: Self-pay | Admitting: Internal Medicine

## 2015-01-20 DIAGNOSIS — I951 Orthostatic hypotension: Secondary | ICD-10-CM | POA: Diagnosis not present

## 2015-01-20 DIAGNOSIS — S22000A Wedge compression fracture of unspecified thoracic vertebra, initial encounter for closed fracture: Secondary | ICD-10-CM | POA: Diagnosis not present

## 2015-01-20 DIAGNOSIS — I350 Nonrheumatic aortic (valve) stenosis: Secondary | ICD-10-CM | POA: Diagnosis not present

## 2015-01-20 DIAGNOSIS — R55 Syncope and collapse: Secondary | ICD-10-CM | POA: Diagnosis not present

## 2015-01-21 DIAGNOSIS — I2510 Atherosclerotic heart disease of native coronary artery without angina pectoris: Secondary | ICD-10-CM | POA: Diagnosis not present

## 2015-01-21 DIAGNOSIS — G308 Other Alzheimer's disease: Secondary | ICD-10-CM | POA: Diagnosis not present

## 2015-01-21 DIAGNOSIS — Z4889 Encounter for other specified surgical aftercare: Secondary | ICD-10-CM | POA: Diagnosis not present

## 2015-01-21 DIAGNOSIS — E039 Hypothyroidism, unspecified: Secondary | ICD-10-CM | POA: Diagnosis not present

## 2015-01-21 DIAGNOSIS — Z7902 Long term (current) use of antithrombotics/antiplatelets: Secondary | ICD-10-CM | POA: Diagnosis not present

## 2015-01-21 DIAGNOSIS — R2689 Other abnormalities of gait and mobility: Secondary | ICD-10-CM | POA: Diagnosis not present

## 2015-01-21 DIAGNOSIS — D869 Sarcoidosis, unspecified: Secondary | ICD-10-CM | POA: Diagnosis not present

## 2015-01-21 DIAGNOSIS — I10 Essential (primary) hypertension: Secondary | ICD-10-CM | POA: Diagnosis not present

## 2015-01-21 DIAGNOSIS — Z9181 History of falling: Secondary | ICD-10-CM | POA: Diagnosis not present

## 2015-01-21 DIAGNOSIS — Z4789 Encounter for other orthopedic aftercare: Secondary | ICD-10-CM | POA: Diagnosis not present

## 2015-01-21 DIAGNOSIS — M6281 Muscle weakness (generalized): Secondary | ICD-10-CM | POA: Diagnosis not present

## 2015-01-21 DIAGNOSIS — S22080A Wedge compression fracture of T11-T12 vertebra, initial encounter for closed fracture: Secondary | ICD-10-CM | POA: Diagnosis not present

## 2015-01-21 DIAGNOSIS — S32019A Unspecified fracture of first lumbar vertebra, initial encounter for closed fracture: Secondary | ICD-10-CM | POA: Diagnosis not present

## 2015-01-21 DIAGNOSIS — R55 Syncope and collapse: Secondary | ICD-10-CM | POA: Diagnosis not present

## 2015-01-21 DIAGNOSIS — E876 Hypokalemia: Secondary | ICD-10-CM | POA: Diagnosis not present

## 2015-01-21 DIAGNOSIS — S22089A Unspecified fracture of T11-T12 vertebra, initial encounter for closed fracture: Secondary | ICD-10-CM | POA: Diagnosis not present

## 2015-01-21 DIAGNOSIS — F329 Major depressive disorder, single episode, unspecified: Secondary | ICD-10-CM | POA: Diagnosis not present

## 2015-01-21 DIAGNOSIS — R410 Disorientation, unspecified: Secondary | ICD-10-CM | POA: Diagnosis not present

## 2015-01-21 DIAGNOSIS — Z955 Presence of coronary angioplasty implant and graft: Secondary | ICD-10-CM | POA: Diagnosis not present

## 2015-01-21 DIAGNOSIS — I350 Nonrheumatic aortic (valve) stenosis: Secondary | ICD-10-CM | POA: Diagnosis not present

## 2015-01-21 DIAGNOSIS — I442 Atrioventricular block, complete: Secondary | ICD-10-CM | POA: Diagnosis not present

## 2015-01-21 DIAGNOSIS — K219 Gastro-esophageal reflux disease without esophagitis: Secondary | ICD-10-CM | POA: Diagnosis not present

## 2015-01-21 DIAGNOSIS — Z950 Presence of cardiac pacemaker: Secondary | ICD-10-CM | POA: Diagnosis not present

## 2015-01-21 DIAGNOSIS — R262 Difficulty in walking, not elsewhere classified: Secondary | ICD-10-CM | POA: Diagnosis not present

## 2015-01-21 DIAGNOSIS — S22000A Wedge compression fracture of unspecified thoracic vertebra, initial encounter for closed fracture: Secondary | ICD-10-CM | POA: Diagnosis not present

## 2015-01-21 DIAGNOSIS — I951 Orthostatic hypotension: Secondary | ICD-10-CM | POA: Diagnosis not present

## 2015-01-22 DIAGNOSIS — K219 Gastro-esophageal reflux disease without esophagitis: Secondary | ICD-10-CM | POA: Diagnosis not present

## 2015-01-22 DIAGNOSIS — Z950 Presence of cardiac pacemaker: Secondary | ICD-10-CM | POA: Diagnosis not present

## 2015-01-22 DIAGNOSIS — R55 Syncope and collapse: Secondary | ICD-10-CM | POA: Diagnosis not present

## 2015-01-22 DIAGNOSIS — S22080A Wedge compression fracture of T11-T12 vertebra, initial encounter for closed fracture: Secondary | ICD-10-CM | POA: Diagnosis not present

## 2015-01-22 DIAGNOSIS — I2510 Atherosclerotic heart disease of native coronary artery without angina pectoris: Secondary | ICD-10-CM | POA: Diagnosis not present

## 2015-01-22 DIAGNOSIS — E039 Hypothyroidism, unspecified: Secondary | ICD-10-CM | POA: Diagnosis not present

## 2015-01-22 DIAGNOSIS — Z7902 Long term (current) use of antithrombotics/antiplatelets: Secondary | ICD-10-CM | POA: Diagnosis not present

## 2015-01-22 DIAGNOSIS — I442 Atrioventricular block, complete: Secondary | ICD-10-CM | POA: Diagnosis not present

## 2015-01-22 DIAGNOSIS — Z4889 Encounter for other specified surgical aftercare: Secondary | ICD-10-CM | POA: Diagnosis not present

## 2015-01-22 DIAGNOSIS — S22089A Unspecified fracture of T11-T12 vertebra, initial encounter for closed fracture: Secondary | ICD-10-CM | POA: Diagnosis not present

## 2015-01-23 DIAGNOSIS — R55 Syncope and collapse: Secondary | ICD-10-CM | POA: Diagnosis not present

## 2015-01-23 DIAGNOSIS — Z4789 Encounter for other orthopedic aftercare: Secondary | ICD-10-CM | POA: Diagnosis not present

## 2015-01-23 DIAGNOSIS — Z9181 History of falling: Secondary | ICD-10-CM | POA: Diagnosis not present

## 2015-01-23 DIAGNOSIS — K219 Gastro-esophageal reflux disease without esophagitis: Secondary | ICD-10-CM | POA: Diagnosis not present

## 2015-01-23 DIAGNOSIS — S22089A Unspecified fracture of T11-T12 vertebra, initial encounter for closed fracture: Secondary | ICD-10-CM | POA: Diagnosis not present

## 2015-01-23 DIAGNOSIS — I10 Essential (primary) hypertension: Secondary | ICD-10-CM | POA: Diagnosis not present

## 2015-01-23 DIAGNOSIS — R1312 Dysphagia, oropharyngeal phase: Secondary | ICD-10-CM | POA: Diagnosis not present

## 2015-01-23 DIAGNOSIS — M6281 Muscle weakness (generalized): Secondary | ICD-10-CM | POA: Diagnosis not present

## 2015-01-23 DIAGNOSIS — Z950 Presence of cardiac pacemaker: Secondary | ICD-10-CM | POA: Diagnosis not present

## 2015-01-23 DIAGNOSIS — S22000A Wedge compression fracture of unspecified thoracic vertebra, initial encounter for closed fracture: Secondary | ICD-10-CM | POA: Diagnosis not present

## 2015-01-23 DIAGNOSIS — R05 Cough: Secondary | ICD-10-CM | POA: Diagnosis not present

## 2015-01-23 DIAGNOSIS — I2510 Atherosclerotic heart disease of native coronary artery without angina pectoris: Secondary | ICD-10-CM | POA: Diagnosis not present

## 2015-01-23 DIAGNOSIS — G308 Other Alzheimer's disease: Secondary | ICD-10-CM | POA: Diagnosis not present

## 2015-01-23 DIAGNOSIS — F329 Major depressive disorder, single episode, unspecified: Secondary | ICD-10-CM | POA: Diagnosis not present

## 2015-01-23 DIAGNOSIS — R2689 Other abnormalities of gait and mobility: Secondary | ICD-10-CM | POA: Diagnosis not present

## 2015-01-23 DIAGNOSIS — I951 Orthostatic hypotension: Secondary | ICD-10-CM | POA: Diagnosis not present

## 2015-01-23 DIAGNOSIS — R278 Other lack of coordination: Secondary | ICD-10-CM | POA: Diagnosis not present

## 2015-01-23 DIAGNOSIS — R296 Repeated falls: Secondary | ICD-10-CM | POA: Diagnosis not present

## 2015-01-23 DIAGNOSIS — Z7902 Long term (current) use of antithrombotics/antiplatelets: Secondary | ICD-10-CM | POA: Diagnosis not present

## 2015-01-23 DIAGNOSIS — E039 Hypothyroidism, unspecified: Secondary | ICD-10-CM | POA: Diagnosis not present

## 2015-01-23 DIAGNOSIS — I350 Nonrheumatic aortic (valve) stenosis: Secondary | ICD-10-CM | POA: Diagnosis not present

## 2015-01-23 DIAGNOSIS — D869 Sarcoidosis, unspecified: Secondary | ICD-10-CM | POA: Diagnosis not present

## 2015-01-25 DIAGNOSIS — S22000A Wedge compression fracture of unspecified thoracic vertebra, initial encounter for closed fracture: Secondary | ICD-10-CM | POA: Diagnosis not present

## 2015-01-25 DIAGNOSIS — I951 Orthostatic hypotension: Secondary | ICD-10-CM | POA: Diagnosis not present

## 2015-02-20 ENCOUNTER — Encounter: Payer: Self-pay | Admitting: *Deleted

## 2015-02-21 DIAGNOSIS — S22000A Wedge compression fracture of unspecified thoracic vertebra, initial encounter for closed fracture: Secondary | ICD-10-CM | POA: Diagnosis not present

## 2015-02-21 DIAGNOSIS — I951 Orthostatic hypotension: Secondary | ICD-10-CM | POA: Diagnosis not present

## 2015-03-07 DIAGNOSIS — S22000A Wedge compression fracture of unspecified thoracic vertebra, initial encounter for closed fracture: Secondary | ICD-10-CM | POA: Diagnosis not present

## 2015-03-07 DIAGNOSIS — I10 Essential (primary) hypertension: Secondary | ICD-10-CM | POA: Diagnosis not present

## 2015-03-09 DIAGNOSIS — Z950 Presence of cardiac pacemaker: Secondary | ICD-10-CM | POA: Diagnosis not present

## 2015-03-09 DIAGNOSIS — Z9181 History of falling: Secondary | ICD-10-CM | POA: Diagnosis not present

## 2015-03-09 DIAGNOSIS — I2510 Atherosclerotic heart disease of native coronary artery without angina pectoris: Secondary | ICD-10-CM | POA: Diagnosis not present

## 2015-03-09 DIAGNOSIS — E038 Other specified hypothyroidism: Secondary | ICD-10-CM | POA: Diagnosis not present

## 2015-03-09 DIAGNOSIS — F329 Major depressive disorder, single episode, unspecified: Secondary | ICD-10-CM | POA: Diagnosis not present

## 2015-03-09 DIAGNOSIS — E785 Hyperlipidemia, unspecified: Secondary | ICD-10-CM | POA: Diagnosis not present

## 2015-03-09 DIAGNOSIS — I10 Essential (primary) hypertension: Secondary | ICD-10-CM | POA: Diagnosis not present

## 2015-03-09 DIAGNOSIS — Z4789 Encounter for other orthopedic aftercare: Secondary | ICD-10-CM | POA: Diagnosis not present

## 2015-03-11 DIAGNOSIS — E785 Hyperlipidemia, unspecified: Secondary | ICD-10-CM | POA: Diagnosis not present

## 2015-03-11 DIAGNOSIS — E038 Other specified hypothyroidism: Secondary | ICD-10-CM | POA: Diagnosis not present

## 2015-03-11 DIAGNOSIS — I2510 Atherosclerotic heart disease of native coronary artery without angina pectoris: Secondary | ICD-10-CM | POA: Diagnosis not present

## 2015-03-11 DIAGNOSIS — Z950 Presence of cardiac pacemaker: Secondary | ICD-10-CM | POA: Diagnosis not present

## 2015-03-11 DIAGNOSIS — F329 Major depressive disorder, single episode, unspecified: Secondary | ICD-10-CM | POA: Diagnosis not present

## 2015-03-11 DIAGNOSIS — Z9181 History of falling: Secondary | ICD-10-CM | POA: Diagnosis not present

## 2015-03-11 DIAGNOSIS — Z4789 Encounter for other orthopedic aftercare: Secondary | ICD-10-CM | POA: Diagnosis not present

## 2015-03-11 DIAGNOSIS — I10 Essential (primary) hypertension: Secondary | ICD-10-CM | POA: Diagnosis not present

## 2015-03-13 DIAGNOSIS — F329 Major depressive disorder, single episode, unspecified: Secondary | ICD-10-CM | POA: Diagnosis not present

## 2015-03-13 DIAGNOSIS — Z9181 History of falling: Secondary | ICD-10-CM | POA: Diagnosis not present

## 2015-03-13 DIAGNOSIS — Z4789 Encounter for other orthopedic aftercare: Secondary | ICD-10-CM | POA: Diagnosis not present

## 2015-03-13 DIAGNOSIS — Z950 Presence of cardiac pacemaker: Secondary | ICD-10-CM | POA: Diagnosis not present

## 2015-03-13 DIAGNOSIS — I2510 Atherosclerotic heart disease of native coronary artery without angina pectoris: Secondary | ICD-10-CM | POA: Diagnosis not present

## 2015-03-13 DIAGNOSIS — E785 Hyperlipidemia, unspecified: Secondary | ICD-10-CM | POA: Diagnosis not present

## 2015-03-13 DIAGNOSIS — E038 Other specified hypothyroidism: Secondary | ICD-10-CM | POA: Diagnosis not present

## 2015-03-13 DIAGNOSIS — I10 Essential (primary) hypertension: Secondary | ICD-10-CM | POA: Diagnosis not present

## 2015-03-17 DIAGNOSIS — Z9181 History of falling: Secondary | ICD-10-CM | POA: Diagnosis not present

## 2015-03-17 DIAGNOSIS — E038 Other specified hypothyroidism: Secondary | ICD-10-CM | POA: Diagnosis not present

## 2015-03-17 DIAGNOSIS — Z950 Presence of cardiac pacemaker: Secondary | ICD-10-CM | POA: Diagnosis not present

## 2015-03-17 DIAGNOSIS — I2510 Atherosclerotic heart disease of native coronary artery without angina pectoris: Secondary | ICD-10-CM | POA: Diagnosis not present

## 2015-03-17 DIAGNOSIS — F329 Major depressive disorder, single episode, unspecified: Secondary | ICD-10-CM | POA: Diagnosis not present

## 2015-03-17 DIAGNOSIS — E785 Hyperlipidemia, unspecified: Secondary | ICD-10-CM | POA: Diagnosis not present

## 2015-03-17 DIAGNOSIS — I10 Essential (primary) hypertension: Secondary | ICD-10-CM | POA: Diagnosis not present

## 2015-03-17 DIAGNOSIS — Z4789 Encounter for other orthopedic aftercare: Secondary | ICD-10-CM | POA: Diagnosis not present

## 2015-03-19 DIAGNOSIS — M810 Age-related osteoporosis without current pathological fracture: Secondary | ICD-10-CM | POA: Diagnosis not present

## 2015-03-20 DIAGNOSIS — Z9181 History of falling: Secondary | ICD-10-CM | POA: Diagnosis not present

## 2015-03-20 DIAGNOSIS — E038 Other specified hypothyroidism: Secondary | ICD-10-CM | POA: Diagnosis not present

## 2015-03-20 DIAGNOSIS — I2510 Atherosclerotic heart disease of native coronary artery without angina pectoris: Secondary | ICD-10-CM | POA: Diagnosis not present

## 2015-03-20 DIAGNOSIS — F329 Major depressive disorder, single episode, unspecified: Secondary | ICD-10-CM | POA: Diagnosis not present

## 2015-03-20 DIAGNOSIS — Z4789 Encounter for other orthopedic aftercare: Secondary | ICD-10-CM | POA: Diagnosis not present

## 2015-03-20 DIAGNOSIS — E785 Hyperlipidemia, unspecified: Secondary | ICD-10-CM | POA: Diagnosis not present

## 2015-03-20 DIAGNOSIS — I10 Essential (primary) hypertension: Secondary | ICD-10-CM | POA: Diagnosis not present

## 2015-03-20 DIAGNOSIS — Z950 Presence of cardiac pacemaker: Secondary | ICD-10-CM | POA: Diagnosis not present

## 2015-04-05 DIAGNOSIS — S32010A Wedge compression fracture of first lumbar vertebra, initial encounter for closed fracture: Secondary | ICD-10-CM | POA: Diagnosis not present

## 2015-04-07 DIAGNOSIS — S22000A Wedge compression fracture of unspecified thoracic vertebra, initial encounter for closed fracture: Secondary | ICD-10-CM | POA: Diagnosis not present

## 2015-04-12 ENCOUNTER — Encounter: Payer: Self-pay | Admitting: Internal Medicine

## 2015-04-12 DIAGNOSIS — E039 Hypothyroidism, unspecified: Secondary | ICD-10-CM | POA: Diagnosis not present

## 2015-04-12 DIAGNOSIS — R0789 Other chest pain: Secondary | ICD-10-CM | POA: Diagnosis not present

## 2015-04-12 DIAGNOSIS — H409 Unspecified glaucoma: Secondary | ICD-10-CM | POA: Diagnosis not present

## 2015-04-12 DIAGNOSIS — R531 Weakness: Secondary | ICD-10-CM | POA: Diagnosis not present

## 2015-04-12 DIAGNOSIS — R262 Difficulty in walking, not elsewhere classified: Secondary | ICD-10-CM | POA: Diagnosis not present

## 2015-04-12 DIAGNOSIS — Z79899 Other long term (current) drug therapy: Secondary | ICD-10-CM | POA: Diagnosis not present

## 2015-04-12 DIAGNOSIS — Z955 Presence of coronary angioplasty implant and graft: Secondary | ICD-10-CM | POA: Diagnosis not present

## 2015-04-12 DIAGNOSIS — D61818 Other pancytopenia: Secondary | ICD-10-CM | POA: Diagnosis not present

## 2015-04-12 DIAGNOSIS — M6281 Muscle weakness (generalized): Secondary | ICD-10-CM | POA: Diagnosis not present

## 2015-04-12 DIAGNOSIS — R05 Cough: Secondary | ICD-10-CM | POA: Diagnosis not present

## 2015-04-12 DIAGNOSIS — E876 Hypokalemia: Secondary | ICD-10-CM | POA: Diagnosis not present

## 2015-04-12 DIAGNOSIS — I442 Atrioventricular block, complete: Secondary | ICD-10-CM | POA: Diagnosis not present

## 2015-04-12 DIAGNOSIS — I350 Nonrheumatic aortic (valve) stenosis: Secondary | ICD-10-CM | POA: Diagnosis not present

## 2015-04-12 DIAGNOSIS — I2510 Atherosclerotic heart disease of native coronary artery without angina pectoris: Secondary | ICD-10-CM | POA: Diagnosis not present

## 2015-04-12 DIAGNOSIS — K219 Gastro-esophageal reflux disease without esophagitis: Secondary | ICD-10-CM | POA: Diagnosis not present

## 2015-04-12 DIAGNOSIS — F339 Major depressive disorder, recurrent, unspecified: Secondary | ICD-10-CM | POA: Diagnosis not present

## 2015-04-12 DIAGNOSIS — G4700 Insomnia, unspecified: Secondary | ICD-10-CM | POA: Diagnosis not present

## 2015-04-12 DIAGNOSIS — Z950 Presence of cardiac pacemaker: Secondary | ICD-10-CM | POA: Diagnosis not present

## 2015-04-12 DIAGNOSIS — Z7982 Long term (current) use of aspirin: Secondary | ICD-10-CM | POA: Diagnosis not present

## 2015-04-12 DIAGNOSIS — M810 Age-related osteoporosis without current pathological fracture: Secondary | ICD-10-CM | POA: Diagnosis not present

## 2015-04-12 DIAGNOSIS — I5032 Chronic diastolic (congestive) heart failure: Secondary | ICD-10-CM | POA: Diagnosis not present

## 2015-04-13 ENCOUNTER — Encounter: Payer: Self-pay | Admitting: Internal Medicine

## 2015-04-13 DIAGNOSIS — R29898 Other symptoms and signs involving the musculoskeletal system: Secondary | ICD-10-CM | POA: Diagnosis not present

## 2015-04-13 DIAGNOSIS — R05 Cough: Secondary | ICD-10-CM | POA: Diagnosis not present

## 2015-04-14 DIAGNOSIS — N170 Acute kidney failure with tubular necrosis: Secondary | ICD-10-CM | POA: Diagnosis not present

## 2015-04-14 DIAGNOSIS — R4182 Altered mental status, unspecified: Secondary | ICD-10-CM | POA: Diagnosis not present

## 2015-04-15 DIAGNOSIS — H409 Unspecified glaucoma: Secondary | ICD-10-CM | POA: Diagnosis not present

## 2015-04-15 DIAGNOSIS — I2510 Atherosclerotic heart disease of native coronary artery without angina pectoris: Secondary | ICD-10-CM | POA: Diagnosis not present

## 2015-04-15 DIAGNOSIS — D869 Sarcoidosis, unspecified: Secondary | ICD-10-CM | POA: Diagnosis not present

## 2015-04-15 DIAGNOSIS — I5032 Chronic diastolic (congestive) heart failure: Secondary | ICD-10-CM | POA: Diagnosis not present

## 2015-04-15 DIAGNOSIS — I509 Heart failure, unspecified: Secondary | ICD-10-CM | POA: Diagnosis not present

## 2015-04-15 DIAGNOSIS — Z79899 Other long term (current) drug therapy: Secondary | ICD-10-CM | POA: Diagnosis not present

## 2015-04-15 DIAGNOSIS — J189 Pneumonia, unspecified organism: Secondary | ICD-10-CM | POA: Diagnosis not present

## 2015-04-15 DIAGNOSIS — Z7982 Long term (current) use of aspirin: Secondary | ICD-10-CM | POA: Diagnosis not present

## 2015-04-15 DIAGNOSIS — B349 Viral infection, unspecified: Secondary | ICD-10-CM | POA: Diagnosis not present

## 2015-04-15 DIAGNOSIS — K219 Gastro-esophageal reflux disease without esophagitis: Secondary | ICD-10-CM | POA: Diagnosis not present

## 2015-04-15 DIAGNOSIS — F339 Major depressive disorder, recurrent, unspecified: Secondary | ICD-10-CM | POA: Diagnosis not present

## 2015-04-15 DIAGNOSIS — R1312 Dysphagia, oropharyngeal phase: Secondary | ICD-10-CM | POA: Diagnosis not present

## 2015-04-15 DIAGNOSIS — Z87310 Personal history of (healed) osteoporosis fracture: Secondary | ICD-10-CM | POA: Diagnosis not present

## 2015-04-15 DIAGNOSIS — G4700 Insomnia, unspecified: Secondary | ICD-10-CM | POA: Diagnosis not present

## 2015-04-15 DIAGNOSIS — M6281 Muscle weakness (generalized): Secondary | ICD-10-CM | POA: Diagnosis not present

## 2015-04-15 DIAGNOSIS — Z950 Presence of cardiac pacemaker: Secondary | ICD-10-CM | POA: Diagnosis not present

## 2015-04-15 DIAGNOSIS — D61818 Other pancytopenia: Secondary | ICD-10-CM | POA: Diagnosis not present

## 2015-04-15 DIAGNOSIS — E039 Hypothyroidism, unspecified: Secondary | ICD-10-CM | POA: Diagnosis not present

## 2015-04-15 DIAGNOSIS — Z888 Allergy status to other drugs, medicaments and biological substances status: Secondary | ICD-10-CM | POA: Diagnosis not present

## 2015-04-15 DIAGNOSIS — R262 Difficulty in walking, not elsewhere classified: Secondary | ICD-10-CM | POA: Diagnosis not present

## 2015-04-15 DIAGNOSIS — R509 Fever, unspecified: Secondary | ICD-10-CM | POA: Diagnosis not present

## 2015-04-15 DIAGNOSIS — N170 Acute kidney failure with tubular necrosis: Secondary | ICD-10-CM | POA: Diagnosis not present

## 2015-04-15 DIAGNOSIS — R296 Repeated falls: Secondary | ICD-10-CM | POA: Diagnosis not present

## 2015-04-15 DIAGNOSIS — R05 Cough: Secondary | ICD-10-CM | POA: Diagnosis not present

## 2015-04-15 DIAGNOSIS — R278 Other lack of coordination: Secondary | ICD-10-CM | POA: Diagnosis not present

## 2015-04-15 DIAGNOSIS — R531 Weakness: Secondary | ICD-10-CM | POA: Diagnosis not present

## 2015-04-15 DIAGNOSIS — E876 Hypokalemia: Secondary | ICD-10-CM | POA: Diagnosis not present

## 2015-04-15 DIAGNOSIS — I442 Atrioventricular block, complete: Secondary | ICD-10-CM | POA: Diagnosis not present

## 2015-04-15 DIAGNOSIS — R2689 Other abnormalities of gait and mobility: Secondary | ICD-10-CM | POA: Diagnosis not present

## 2015-04-15 DIAGNOSIS — I350 Nonrheumatic aortic (valve) stenosis: Secondary | ICD-10-CM | POA: Diagnosis not present

## 2015-04-15 DIAGNOSIS — R0902 Hypoxemia: Secondary | ICD-10-CM | POA: Diagnosis not present

## 2015-04-15 DIAGNOSIS — E860 Dehydration: Secondary | ICD-10-CM | POA: Diagnosis not present

## 2015-04-15 DIAGNOSIS — M810 Age-related osteoporosis without current pathological fracture: Secondary | ICD-10-CM | POA: Diagnosis not present

## 2015-04-15 DIAGNOSIS — Z955 Presence of coronary angioplasty implant and graft: Secondary | ICD-10-CM | POA: Diagnosis not present

## 2015-04-24 ENCOUNTER — Encounter: Payer: Self-pay | Admitting: *Deleted

## 2015-04-25 ENCOUNTER — Encounter: Payer: Medicaid Other | Admitting: Internal Medicine

## 2015-04-25 ENCOUNTER — Encounter: Payer: Self-pay | Admitting: Internal Medicine

## 2015-04-25 DIAGNOSIS — N170 Acute kidney failure with tubular necrosis: Secondary | ICD-10-CM | POA: Diagnosis not present

## 2015-04-27 DIAGNOSIS — I350 Nonrheumatic aortic (valve) stenosis: Secondary | ICD-10-CM | POA: Diagnosis not present

## 2015-04-27 DIAGNOSIS — R2689 Other abnormalities of gait and mobility: Secondary | ICD-10-CM | POA: Diagnosis not present

## 2015-04-27 DIAGNOSIS — Z9109 Other allergy status, other than to drugs and biological substances: Secondary | ICD-10-CM | POA: Diagnosis not present

## 2015-04-27 DIAGNOSIS — E871 Hypo-osmolality and hyponatremia: Secondary | ICD-10-CM | POA: Diagnosis not present

## 2015-04-27 DIAGNOSIS — G308 Other Alzheimer's disease: Secondary | ICD-10-CM | POA: Diagnosis not present

## 2015-04-27 DIAGNOSIS — G309 Alzheimer's disease, unspecified: Secondary | ICD-10-CM | POA: Diagnosis not present

## 2015-04-27 DIAGNOSIS — J189 Pneumonia, unspecified organism: Secondary | ICD-10-CM | POA: Diagnosis not present

## 2015-04-27 DIAGNOSIS — I442 Atrioventricular block, complete: Secondary | ICD-10-CM | POA: Diagnosis not present

## 2015-04-27 DIAGNOSIS — I509 Heart failure, unspecified: Secondary | ICD-10-CM | POA: Diagnosis not present

## 2015-04-27 DIAGNOSIS — J9601 Acute respiratory failure with hypoxia: Secondary | ICD-10-CM | POA: Diagnosis not present

## 2015-04-27 DIAGNOSIS — F0280 Dementia in other diseases classified elsewhere without behavioral disturbance: Secondary | ICD-10-CM | POA: Diagnosis not present

## 2015-04-27 DIAGNOSIS — Z955 Presence of coronary angioplasty implant and graft: Secondary | ICD-10-CM | POA: Diagnosis not present

## 2015-04-27 DIAGNOSIS — F329 Major depressive disorder, single episode, unspecified: Secondary | ICD-10-CM | POA: Diagnosis not present

## 2015-04-27 DIAGNOSIS — Z7982 Long term (current) use of aspirin: Secondary | ICD-10-CM | POA: Diagnosis not present

## 2015-04-27 DIAGNOSIS — D638 Anemia in other chronic diseases classified elsewhere: Secondary | ICD-10-CM | POA: Diagnosis not present

## 2015-04-27 DIAGNOSIS — M6281 Muscle weakness (generalized): Secondary | ICD-10-CM | POA: Diagnosis not present

## 2015-04-27 DIAGNOSIS — Z9181 History of falling: Secondary | ICD-10-CM | POA: Diagnosis not present

## 2015-04-27 DIAGNOSIS — Z66 Do not resuscitate: Secondary | ICD-10-CM | POA: Diagnosis not present

## 2015-04-27 DIAGNOSIS — D869 Sarcoidosis, unspecified: Secondary | ICD-10-CM | POA: Diagnosis not present

## 2015-04-27 DIAGNOSIS — I5033 Acute on chronic diastolic (congestive) heart failure: Secondary | ICD-10-CM | POA: Diagnosis not present

## 2015-04-27 DIAGNOSIS — E860 Dehydration: Secondary | ICD-10-CM | POA: Diagnosis not present

## 2015-04-27 DIAGNOSIS — E039 Hypothyroidism, unspecified: Secondary | ICD-10-CM | POA: Diagnosis not present

## 2015-04-27 DIAGNOSIS — I2510 Atherosclerotic heart disease of native coronary artery without angina pectoris: Secondary | ICD-10-CM | POA: Diagnosis not present

## 2015-04-27 DIAGNOSIS — Z950 Presence of cardiac pacemaker: Secondary | ICD-10-CM | POA: Diagnosis not present

## 2015-04-27 DIAGNOSIS — E876 Hypokalemia: Secondary | ICD-10-CM | POA: Diagnosis not present

## 2015-04-27 DIAGNOSIS — J159 Unspecified bacterial pneumonia: Secondary | ICD-10-CM | POA: Diagnosis not present

## 2015-04-27 DIAGNOSIS — Z79899 Other long term (current) drug therapy: Secondary | ICD-10-CM | POA: Diagnosis not present

## 2015-04-27 DIAGNOSIS — R05 Cough: Secondary | ICD-10-CM | POA: Diagnosis not present

## 2015-04-27 DIAGNOSIS — I10 Essential (primary) hypertension: Secondary | ICD-10-CM | POA: Diagnosis not present

## 2015-04-27 DIAGNOSIS — K219 Gastro-esophageal reflux disease without esophagitis: Secondary | ICD-10-CM | POA: Diagnosis not present

## 2015-04-27 DIAGNOSIS — R509 Fever, unspecified: Secondary | ICD-10-CM | POA: Diagnosis not present

## 2015-05-02 DIAGNOSIS — S22000A Wedge compression fracture of unspecified thoracic vertebra, initial encounter for closed fracture: Secondary | ICD-10-CM | POA: Diagnosis not present

## 2015-05-02 DIAGNOSIS — F329 Major depressive disorder, single episode, unspecified: Secondary | ICD-10-CM | POA: Diagnosis not present

## 2015-05-02 DIAGNOSIS — G308 Other Alzheimer's disease: Secondary | ICD-10-CM | POA: Diagnosis not present

## 2015-05-02 DIAGNOSIS — K219 Gastro-esophageal reflux disease without esophagitis: Secondary | ICD-10-CM | POA: Diagnosis not present

## 2015-05-02 DIAGNOSIS — F0280 Dementia in other diseases classified elsewhere without behavioral disturbance: Secondary | ICD-10-CM | POA: Diagnosis not present

## 2015-05-02 DIAGNOSIS — I5033 Acute on chronic diastolic (congestive) heart failure: Secondary | ICD-10-CM | POA: Diagnosis not present

## 2015-05-02 DIAGNOSIS — M6281 Muscle weakness (generalized): Secondary | ICD-10-CM | POA: Diagnosis not present

## 2015-05-02 DIAGNOSIS — I509 Heart failure, unspecified: Secondary | ICD-10-CM | POA: Diagnosis not present

## 2015-05-02 DIAGNOSIS — E039 Hypothyroidism, unspecified: Secondary | ICD-10-CM | POA: Diagnosis not present

## 2015-05-02 DIAGNOSIS — R2689 Other abnormalities of gait and mobility: Secondary | ICD-10-CM | POA: Diagnosis not present

## 2015-05-02 DIAGNOSIS — I2510 Atherosclerotic heart disease of native coronary artery without angina pectoris: Secondary | ICD-10-CM | POA: Diagnosis not present

## 2015-05-02 DIAGNOSIS — J189 Pneumonia, unspecified organism: Secondary | ICD-10-CM | POA: Diagnosis not present

## 2015-05-06 DIAGNOSIS — I5033 Acute on chronic diastolic (congestive) heart failure: Secondary | ICD-10-CM | POA: Diagnosis not present

## 2015-05-27 DIAGNOSIS — H40001 Preglaucoma, unspecified, right eye: Secondary | ICD-10-CM | POA: Diagnosis not present

## 2015-06-06 DIAGNOSIS — R4182 Altered mental status, unspecified: Secondary | ICD-10-CM | POA: Diagnosis not present

## 2015-06-06 DIAGNOSIS — N170 Acute kidney failure with tubular necrosis: Secondary | ICD-10-CM | POA: Diagnosis not present

## 2015-06-07 DIAGNOSIS — S22000A Wedge compression fracture of unspecified thoracic vertebra, initial encounter for closed fracture: Secondary | ICD-10-CM | POA: Diagnosis not present

## 2015-07-04 DIAGNOSIS — M79609 Pain in unspecified limb: Secondary | ICD-10-CM | POA: Diagnosis not present

## 2015-07-04 DIAGNOSIS — M25539 Pain in unspecified wrist: Secondary | ICD-10-CM | POA: Diagnosis not present

## 2015-07-07 DIAGNOSIS — S22000A Wedge compression fracture of unspecified thoracic vertebra, initial encounter for closed fracture: Secondary | ICD-10-CM | POA: Diagnosis not present

## 2015-07-07 DIAGNOSIS — R2689 Other abnormalities of gait and mobility: Secondary | ICD-10-CM | POA: Diagnosis not present

## 2015-07-07 DIAGNOSIS — M6281 Muscle weakness (generalized): Secondary | ICD-10-CM | POA: Diagnosis not present

## 2015-07-08 DIAGNOSIS — M6281 Muscle weakness (generalized): Secondary | ICD-10-CM | POA: Diagnosis not present

## 2015-07-08 DIAGNOSIS — R2689 Other abnormalities of gait and mobility: Secondary | ICD-10-CM | POA: Diagnosis not present

## 2015-07-14 DIAGNOSIS — M6281 Muscle weakness (generalized): Secondary | ICD-10-CM | POA: Diagnosis not present

## 2015-07-14 DIAGNOSIS — R2689 Other abnormalities of gait and mobility: Secondary | ICD-10-CM | POA: Diagnosis not present

## 2015-07-15 DIAGNOSIS — M6281 Muscle weakness (generalized): Secondary | ICD-10-CM | POA: Diagnosis not present

## 2015-07-15 DIAGNOSIS — R2689 Other abnormalities of gait and mobility: Secondary | ICD-10-CM | POA: Diagnosis not present

## 2015-07-16 DIAGNOSIS — R2689 Other abnormalities of gait and mobility: Secondary | ICD-10-CM | POA: Diagnosis not present

## 2015-07-16 DIAGNOSIS — M6281 Muscle weakness (generalized): Secondary | ICD-10-CM | POA: Diagnosis not present

## 2015-07-17 DIAGNOSIS — R2689 Other abnormalities of gait and mobility: Secondary | ICD-10-CM | POA: Diagnosis not present

## 2015-07-17 DIAGNOSIS — M6281 Muscle weakness (generalized): Secondary | ICD-10-CM | POA: Diagnosis not present

## 2015-07-18 ENCOUNTER — Encounter: Payer: Self-pay | Admitting: Internal Medicine

## 2015-07-18 ENCOUNTER — Ambulatory Visit (INDEPENDENT_AMBULATORY_CARE_PROVIDER_SITE_OTHER): Payer: Medicare Other | Admitting: *Deleted

## 2015-07-18 DIAGNOSIS — I442 Atrioventricular block, complete: Secondary | ICD-10-CM | POA: Diagnosis not present

## 2015-07-18 LAB — CUP PACEART INCLINIC DEVICE CHECK
Battery Remaining Longevity: 34 mo
Battery Voltage: 2.77 V
Brady Statistic AP VP Percent: 28 %
Brady Statistic AS VP Percent: 70 %
Brady Statistic AS VS Percent: 2 %
Date Time Interrogation Session: 20160722101117
Lead Channel Impedance Value: 395 Ohm
Lead Channel Pacing Threshold Amplitude: 0.75 V
Lead Channel Pacing Threshold Pulse Width: 0.4 ms
Lead Channel Pacing Threshold Pulse Width: 0.4 ms
Lead Channel Sensing Intrinsic Amplitude: 0.7 mV
Lead Channel Setting Pacing Amplitude: 2.5 V
Lead Channel Setting Pacing Pulse Width: 0.4 ms
Lead Channel Setting Sensing Sensitivity: 2.8 mV
MDC IDC MSMT BATTERY IMPEDANCE: 1851 Ohm
MDC IDC MSMT LEADCHNL RA PACING THRESHOLD AMPLITUDE: 0.75 V
MDC IDC MSMT LEADCHNL RV IMPEDANCE VALUE: 930 Ohm
MDC IDC SET LEADCHNL RA PACING AMPLITUDE: 2 V
MDC IDC STAT BRADY AP VS PERCENT: 0 %

## 2015-07-18 NOTE — Progress Notes (Signed)
Normal pacer check in clinic Estimated longevity 3 years 172 mode switches--longest was 51 seconds 1 ventricular high rate episode lasting 9 beats No changes made ROV in 6 mths w/JA in [place] office.

## 2015-08-05 DIAGNOSIS — N170 Acute kidney failure with tubular necrosis: Secondary | ICD-10-CM | POA: Diagnosis not present

## 2015-08-05 DIAGNOSIS — R4182 Altered mental status, unspecified: Secondary | ICD-10-CM | POA: Diagnosis not present

## 2015-08-07 DIAGNOSIS — S22000A Wedge compression fracture of unspecified thoracic vertebra, initial encounter for closed fracture: Secondary | ICD-10-CM | POA: Diagnosis not present

## 2015-09-07 DIAGNOSIS — S22000A Wedge compression fracture of unspecified thoracic vertebra, initial encounter for closed fracture: Secondary | ICD-10-CM | POA: Diagnosis not present

## 2015-09-29 DIAGNOSIS — Z23 Encounter for immunization: Secondary | ICD-10-CM | POA: Diagnosis not present

## 2015-09-30 DIAGNOSIS — N170 Acute kidney failure with tubular necrosis: Secondary | ICD-10-CM | POA: Diagnosis not present

## 2015-09-30 DIAGNOSIS — R4182 Altered mental status, unspecified: Secondary | ICD-10-CM | POA: Diagnosis not present

## 2015-10-06 ENCOUNTER — Telehealth: Payer: Self-pay | Admitting: Internal Medicine

## 2015-10-06 NOTE — Telephone Encounter (Signed)
Updated pt as of 07/18/15 check she has 68yrs battery life remaining from that point. Pt states she has been extra weak x3d. I reassured her if her device were to "stop working" her heart rate would be in the 30s or slower.   Pt also discussed frustration with staff at her facility. She also expressed feeling trapped. However, she did acknowledge great difficulty walking to use bathroom x2 today. Pt said she broke her back in April and has been recovering since.   Pt aware she's safe to keep her appt in Feb/2017 to see Dr. Johney Frame. No visits needed sooner.

## 2015-10-07 DIAGNOSIS — S22000A Wedge compression fracture of unspecified thoracic vertebra, initial encounter for closed fracture: Secondary | ICD-10-CM | POA: Diagnosis not present

## 2015-10-15 DIAGNOSIS — M545 Low back pain: Secondary | ICD-10-CM | POA: Diagnosis not present

## 2015-10-15 DIAGNOSIS — M5136 Other intervertebral disc degeneration, lumbar region: Secondary | ICD-10-CM | POA: Diagnosis not present

## 2015-10-15 DIAGNOSIS — Z9889 Other specified postprocedural states: Secondary | ICD-10-CM | POA: Diagnosis not present

## 2015-10-16 ENCOUNTER — Ambulatory Visit: Payer: Medicare Other | Admitting: Cardiology

## 2015-10-27 ENCOUNTER — Ambulatory Visit (INDEPENDENT_AMBULATORY_CARE_PROVIDER_SITE_OTHER): Payer: Medicare Other | Admitting: Cardiology

## 2015-10-27 ENCOUNTER — Encounter: Payer: Self-pay | Admitting: Cardiology

## 2015-10-27 VITALS — BP 125/74 | HR 62 | Ht 65.0 in | Wt 143.8 lb

## 2015-10-27 DIAGNOSIS — I442 Atrioventricular block, complete: Secondary | ICD-10-CM

## 2015-10-27 DIAGNOSIS — I255 Ischemic cardiomyopathy: Secondary | ICD-10-CM

## 2015-10-27 DIAGNOSIS — Z950 Presence of cardiac pacemaker: Secondary | ICD-10-CM | POA: Diagnosis not present

## 2015-10-27 DIAGNOSIS — I2510 Atherosclerotic heart disease of native coronary artery without angina pectoris: Secondary | ICD-10-CM | POA: Diagnosis not present

## 2015-10-27 DIAGNOSIS — E785 Hyperlipidemia, unspecified: Secondary | ICD-10-CM

## 2015-10-27 NOTE — Progress Notes (Signed)
Cardiology Office Note  Date: 10/27/2015   ID: Cynthia [name], DOB 1931/09/28, MRN [id number]  PCP: Donzetta SprungANIEL, TERRY, MD  Primary Cardiologist: Nona DellSamuel Torie Priebe, MD   Chief Complaint  Patient presents with  . Coronary Artery Disease  . Cardiomyopathy    History of Present Illness: Cynthia [name] is an 81 y.o. female not seen since June 2014. She has maintained interval follow-up with Dr. Johney FrameAllred. Device interrogation from July revealed normal pacer function with estimated 3 year battery life remaining, 172 mode switches with one high ventricular rate lasting only 9 beats.  [facility] in [facility], has been there since February after having a fall and fracture requiring back surgery. She is ambulatory.  From a cardiac perspective she does not report any angina symptoms on medical therapy and has NYHA class II symptoms at baseline. No leg edema, orthopnea, or PND. She is not overly active at the nursing center, participates in some of the activities, but generally low-level.  We reviewed her medications. She continues on aspirin and Plavix, no bleeding problems. She has not required any nitroglycerin. I reviewed her ECG from April.  Echocardiogram from 2014 is outlined below showing improvement in LVEF on medical therapy.   Past Medical History  Diagnosis Date  . Complete heart block (HCC)     a. s/p Medtronic PPM  . Sarcoidosis (HCC)   . Chronic diastolic heart failure (HCC)   . Coronary atherosclerosis of native coronary artery     a. DES to LAD 2/05. b. DES to LAD 02/2013 for ISR.  Marland Kitchen. Arthritis   . Depression   . Hyperlipidemia   . Hypothyroidism   . GERD (gastroesophageal reflux disease)   . Glaucoma   . Cataracts, bilateral   . Ischemic cardiomyopathy     a. EF 45-50% by cath 03/01/13.    Past Surgical History  Procedure Laterality Date  . Anterior cervical discectomy      Cervical fusion  . Lumbar laminectomy      L3 S1  . Pacemaker insertion   2000, 2007    Medtronic  . Gallbladder surgery    . Cataracts      bilateral  . Colonoscopy    . Upper gastrointestinal endoscopy    . Percutaneous coronary stent intervention (pci-s) Left 03/01/13  . Percutaneous coronary stent intervention (pci-s) N/A 03/01/2013    Procedure: PERCUTANEOUS CORONARY STENT INTERVENTION (PCI-S);  Surgeon: Kathleene Hazelhristopher D McAlhany, MD;  [facility];  Service: Cardiovascular;  Laterality: N/A;    Current Outpatient Prescriptions  Medication Sig Dispense Refill  . acetaminophen (TYLENOL) 650 MG CR tablet Take 650 mg by mouth at bedtime.    Marland Kitchen. aspirin EC 81 MG tablet Take 81 mg by mouth daily.    . calcium-vitamin D (OSCAL 500/200 D-3) 500-200 MG-UNIT tablet Take 1 tablet by mouth 2 (two) times daily.    . Cholecalciferol (VITAMIN D-3) 1000 UNITS CAPS Take 1 capsule by mouth daily.    . clopidogrel (PLAVIX) 75 MG tablet TAKE ONE TABLET BY MOUTH DAILY WITH BREAKFAST 30 tablet 6  . dorzolamide-timolol (COSOPT) 22.3-6.8 MG/ML ophthalmic solution Place 1 drop into the left eye 2 (two) times daily.     Marland Kitchen. escitalopram (LEXAPRO) 10 MG tablet Take 10 mg by mouth daily.    . furosemide (LASIX) 40 MG tablet Take 40 mg by mouth daily.    Marland Kitchen. levothyroxine (SYNTHROID, LEVOTHROID) 88 MCG tablet Take 88 mcg by mouth daily.     .Marland Kitchen  Multiple Vitamin (MULTIVITAMIN) tablet Take 1 tablet by mouth daily.    . potassium chloride SA (K-DUR,KLOR-CON) 20 MEQ tablet Take 20 mEq by mouth daily.    . ranitidine (ZANTAC) 150 MG tablet Take 150 mg by mouth 2 (two) times daily.    Marland Kitchen tiZANidine (ZANAFLEX) 2 MG tablet Take 2 mg by mouth every 8 (eight) hours as needed for muscle spasms.    . traMADol (ULTRAM) 50 MG tablet Take 50 mg by mouth every 6 (six) hours as needed.     No current facility-administered medications for this visit.    Allergies:  Statins   Social History: The patient  reports that she has never smoked. She has never used smokeless tobacco. She reports that she does  not drink alcohol or use illicit drugs.   ROS:  Please see the history of present illness. Otherwise, complete review of systems is positive for mild chronic back pain.  All other systems are reviewed and negative.   Physical Exam: VS:  BP 125/74 mmHg  Pulse 62  Ht  (1.651 m)  Wt 143 lb 12.8 oz (65.227 kg)  BMI 23.93 kg/m2  SpO2 96%, BMI Body mass index is 23.93 kg/(m^2).  Wt Readings from Last 3 Encounters:  10/27/15 143 lb 12.8 oz (65.227 kg)  02/11/14 159 lb 12.8 oz (72.485 kg)  06/20/13 150 lb 12.8 oz (68.402 kg)     Elderly woman seated in wheelchair, appears comfortable at rest.  HEENT: Conjunctiva and lids normal, oropharynx moist mucosa.  Neck: Supple, no carotid bruits, no thyromegaly.  Lungs: Clear to auscultation, nonlabored.  Cardiac: Regular rate and rhythm.  Thorax: Stable device pocket site.  Extremities: No pitting edema.  Skin: Warm and dry. Musculoskeletal: Mild kyphosis. Neuropsychiatric: Alert and oriented 3, affect appropriate.   ECG: Tracing from 04/12/2015 showed sinus rhythm with ventricular pacing and PVCs.   Recent Labwork:  April 2016: BUN 22, creatinine 1.5, potassium 3.8, AST 29, ALT 13, TSH 2.5, hemoglobin 13.5, platelets 154  Other Studies Reviewed Today:  Echocardiogram in March 2014 demonstrated moderate LVH with LVEF 60-65%, grade 1 diastolic dysfunction, moderately calcified aortic leaflets with mild aortic regurgitation, mild mitral regurgitation with mild SAM, mild left atrial enlargement.  Assessment and Plan:  1. Symptomatically stable CAD on medical therapy status post DES to the LAD most recently in March 2014 due to in-stent restenosis. We discussed her medications, no changes were made today. Continue activities as tolerated.  2. Complete heart block status post Medtronic pacemaker, followed by Dr. Johney Frame. Most recent remote device interrogation noted above.  3. History of ischemic cardiomyopathy with normalization  of LVEF to the range of 60-65% as of 2014. She reports stable NYHA class II dyspnea.  4. Hyperlipidemia, previously on Lipitor. She has been followed by Dr. Reuel Boom.  Current medicines were reviewed with the patient today.  Disposition: FU with me in 6 months.   Signed, [name], MD, [facility] 10/27/2015 8:58 AM    [facility] [address] [place], [place], [place] [address] Phone: [phone]; Fax: [phone]

## 2015-10-27 NOTE — Patient Instructions (Signed)
Your physician recommends that you continue on your current medications as directed. Please refer to the Current Medication list given to you today. Your physician recommends that you schedule a follow-up appointment in: 6 months. You will receive a reminder letter in the mail in about 4 months reminding you to call and schedule your appointment. If you don't receive this letter, please contact our office. 

## 2015-11-07 DIAGNOSIS — S22000A Wedge compression fracture of unspecified thoracic vertebra, initial encounter for closed fracture: Secondary | ICD-10-CM | POA: Diagnosis not present

## 2015-11-25 DIAGNOSIS — R4182 Altered mental status, unspecified: Secondary | ICD-10-CM | POA: Diagnosis not present

## 2015-11-25 DIAGNOSIS — N170 Acute kidney failure with tubular necrosis: Secondary | ICD-10-CM | POA: Diagnosis not present

## 2015-12-01 DIAGNOSIS — S22000A Wedge compression fracture of unspecified thoracic vertebra, initial encounter for closed fracture: Secondary | ICD-10-CM | POA: Diagnosis not present

## 2015-12-01 DIAGNOSIS — M545 Low back pain: Secondary | ICD-10-CM | POA: Diagnosis not present

## 2015-12-01 DIAGNOSIS — G8929 Other chronic pain: Secondary | ICD-10-CM | POA: Diagnosis not present

## 2015-12-07 DIAGNOSIS — S22000A Wedge compression fracture of unspecified thoracic vertebra, initial encounter for closed fracture: Secondary | ICD-10-CM | POA: Diagnosis not present

## 2016-01-17 DIAGNOSIS — R4182 Altered mental status, unspecified: Secondary | ICD-10-CM | POA: Diagnosis not present

## 2016-01-17 DIAGNOSIS — N170 Acute kidney failure with tubular necrosis: Secondary | ICD-10-CM | POA: Diagnosis not present

## 2016-01-30 ENCOUNTER — Encounter: Payer: Self-pay | Admitting: Internal Medicine

## 2016-01-30 ENCOUNTER — Ambulatory Visit (INDEPENDENT_AMBULATORY_CARE_PROVIDER_SITE_OTHER): Payer: Medicare Other | Admitting: Internal Medicine

## 2016-01-30 VITALS — BP 114/72 | HR 71 | Ht 65.0 in | Wt 140.8 lb

## 2016-01-30 DIAGNOSIS — I2510 Atherosclerotic heart disease of native coronary artery without angina pectoris: Secondary | ICD-10-CM

## 2016-01-30 DIAGNOSIS — I442 Atrioventricular block, complete: Secondary | ICD-10-CM | POA: Diagnosis not present

## 2016-01-30 LAB — CUP PACEART INCLINIC DEVICE CHECK
Battery Impedance: 2168 Ohm
Battery Remaining Longevity: 27 mo
Battery Voltage: 2.77 V
Brady Statistic AP VP Percent: 60 %
Brady Statistic AP VS Percent: 0 %
Brady Statistic AS VP Percent: 40 %
Brady Statistic AS VS Percent: 0 %
Date Time Interrogation Session: 20170203142247
Implantable Lead Implant Date: 20000908
Implantable Lead Implant Date: 20000908
Implantable Lead Location: 753859
Implantable Lead Location: 753860
Implantable Lead Model: 4285
Implantable Lead Serial Number: 282090
Lead Channel Impedance Value: 373 Ohm
Lead Channel Impedance Value: 949 Ohm
Lead Channel Pacing Threshold Amplitude: 0.5 V
Lead Channel Pacing Threshold Amplitude: 0.5 V
Lead Channel Pacing Threshold Amplitude: 0.5 V
Lead Channel Pacing Threshold Amplitude: 0.75 V
Lead Channel Pacing Threshold Pulse Width: 0.4 ms
Lead Channel Pacing Threshold Pulse Width: 0.4 ms
Lead Channel Pacing Threshold Pulse Width: 0.4 ms
Lead Channel Pacing Threshold Pulse Width: 0.4 ms
Lead Channel Sensing Intrinsic Amplitude: 2 mV
Lead Channel Setting Pacing Amplitude: 2 V
Lead Channel Setting Pacing Amplitude: 2.5 V
Lead Channel Setting Pacing Pulse Width: 0.4 ms
Lead Channel Setting Sensing Sensitivity: 2.8 mV

## 2016-01-30 NOTE — Progress Notes (Signed)
PCP:  Donzetta Sprung, MD Primary Cardiologist:  Dr Diona Browner  The patient presents today for routine electrophysiology followup.  Today, she denies symptoms of palpitations, lower extremity edema, dizziness, presyncope, syncope, or neurologic sequela.  The patient feels that she is tolerating medications without difficulties and is otherwise without complaint today.   Past Medical History  Diagnosis Date  . Complete heart block (HCC)     a. s/p Medtronic PPM  . Sarcoidosis (HCC)   . Chronic diastolic heart failure (HCC)   . Coronary atherosclerosis of native coronary artery     a. DES to LAD 2/05. b. DES to LAD 02/2013 for ISR.  Marland Kitchen Arthritis   . Depression   . Hyperlipidemia   . Hypothyroidism   . GERD (gastroesophageal reflux disease)   . Glaucoma   . Cataracts, bilateral   . Ischemic cardiomyopathy     a. EF 45-50% by cath 03/01/13.   Past Surgical History  Procedure Laterality Date  . Anterior cervical discectomy      Cervical fusion  . Lumbar laminectomy      L3 S1  . Pacemaker insertion  2000, 2007    Medtronic  . Gallbladder surgery    . Cataracts      bilateral  . Colonoscopy    . Upper gastrointestinal endoscopy    . Percutaneous coronary stent intervention (pci-s) Left 03/01/13  . Percutaneous coronary stent intervention (pci-s) N/A 03/01/2013    Procedure: PERCUTANEOUS CORONARY STENT INTERVENTION (PCI-S);  Surgeon: Kathleene Hazel, MD;  Location: [facility] CATH LAB;  Service: Cardiovascular;  Laterality: N/A;    Current Outpatient Prescriptions  Medication Sig Dispense Refill  . acetaminophen (TYLENOL) 650 MG CR tablet Take 650 mg by mouth at bedtime.    Marland Kitchen aspirin EC 81 MG tablet Take 81 mg by mouth daily.    . calcium-vitamin D (OSCAL 500/200 D-3) 500-200 MG-UNIT tablet Take 1 tablet by mouth 2 (two) times daily.    . Cholecalciferol (VITAMIN D-3) 1000 UNITS CAPS Take 1 capsule by mouth daily.    . clopidogrel (PLAVIX) 75 MG tablet TAKE ONE TABLET BY MOUTH DAILY WITH  BREAKFAST 30 tablet 6  . dorzolamide-timolol (COSOPT) 22.3-6.8 MG/ML ophthalmic solution Place 1 drop into the left eye 2 (two) times daily.     . DULoxetine (CYMBALTA) 60 MG capsule Take 60 mg by mouth daily.    . furosemide (LASIX) 40 MG tablet Take 40 mg by mouth daily.    Marland Kitchen levothyroxine (SYNTHROID, LEVOTHROID) 88 MCG tablet Take 88 mcg by mouth daily.     . Multiple Vitamin (MULTIVITAMIN) tablet Take 1 tablet by mouth daily.    . potassium chloride SA (K-DUR,KLOR-CON) 20 MEQ tablet Take 20 mEq by mouth daily.    . ranitidine (ZANTAC) 150 MG tablet Take 150 mg by mouth 2 (two) times daily.    Marland Kitchen tiZANidine (ZANAFLEX) 2 MG tablet Take 2 mg by mouth every 8 (eight) hours as needed for muscle spasms.    . traMADol (ULTRAM) 50 MG tablet Take 50 mg by mouth every 6 (six) hours as needed.     No current facility-administered medications for this visit.    Allergies  Allergen Reactions  . Statins     Social History   Social History  . Marital Status: Divorced    Spouse Name: N/A  . Number of Children: 3  . Years of Education: N/A   Occupational History  . retired    Social History Main Topics  . Smoking  status: Never Smoker   . Smokeless tobacco: Never Used  . Alcohol Use: No  . Drug Use: No  . Sexual Activity: No   Other Topics Concern  . Not on file   Social History Narrative    Physical Exam: Filed Vitals:   01/30/16 1045  BP: 114/72  Pulse: 71  Height:  (1.651 m)  Weight: 140 lb 12.8 oz (63.866 kg)  SpO2: 97%    GEN- The patient is well appearing, alert and oriented x 3 today.   Head- normocephalic, atraumatic Eyes-  Sclera clear, conjunctiva pink Ears- hearing intact Oropharynx- clear Neck- supple,  Lungs- Clear to ausculation bilaterally, normal work of breathing Chest- pacemaker pocket is well healed Heart- Regular rate and rhythm, no murmurs, rubs or gallops, PMI not laterally displaced GI- soft, NT, ND, + BS Extremities- no clubbing, cyanosis,  or edema  Pacemaker interrogation- reviewed in detail today,  See PACEART report  Assessment and Plan: 1. Complete heart block Normal pacemaker function She did have atrial polarity switch in November for low impedance.  Noise noted since that time.  I have reprogrammed back to bipolar today and will follow. She does not atrial pace and there is no indication for lead revision at this time. See Pace Art report No changes today  2. CAD Stable No change required today  Follow-up with Dr Diona Browner as scheduled  No longer doing carelink and not willing to try again presently Will therefore return to device clinic in 6 months I will see in a year  Hillis Range MD, [facility] 01/30/2016 11:20 AM

## 2016-01-30 NOTE — Patient Instructions (Signed)
Your physician recommends that you continue on your current medications as directed. Please refer to the Current Medication list given to you today. Your physician recommends that you schedule a follow-up appointment in: 6 months in the device clinic. You will receive a reminder letter in the mail in about 4 months reminding you to call and schedule your appointment. If you don't receive this letter, please contact our office. Your physician recommends that you schedule a follow-up appointment in: 1 year with Dr. Allred. 

## 2016-03-01 DIAGNOSIS — T1490 Injury, unspecified: Secondary | ICD-10-CM | POA: Diagnosis not present

## 2016-03-09 DIAGNOSIS — M545 Low back pain: Secondary | ICD-10-CM | POA: Diagnosis not present

## 2016-03-09 DIAGNOSIS — S22000A Wedge compression fracture of unspecified thoracic vertebra, initial encounter for closed fracture: Secondary | ICD-10-CM | POA: Diagnosis not present

## 2016-03-09 DIAGNOSIS — G8929 Other chronic pain: Secondary | ICD-10-CM | POA: Diagnosis not present

## 2016-03-09 DIAGNOSIS — M810 Age-related osteoporosis without current pathological fracture: Secondary | ICD-10-CM | POA: Diagnosis not present

## 2016-03-17 DIAGNOSIS — R531 Weakness: Secondary | ICD-10-CM | POA: Diagnosis not present

## 2016-03-17 DIAGNOSIS — K219 Gastro-esophageal reflux disease without esophagitis: Secondary | ICD-10-CM | POA: Diagnosis not present

## 2016-03-17 DIAGNOSIS — Z7902 Long term (current) use of antithrombotics/antiplatelets: Secondary | ICD-10-CM | POA: Diagnosis not present

## 2016-03-17 DIAGNOSIS — Z7982 Long term (current) use of aspirin: Secondary | ICD-10-CM | POA: Diagnosis not present

## 2016-03-17 DIAGNOSIS — K5901 Slow transit constipation: Secondary | ICD-10-CM | POA: Diagnosis not present

## 2016-03-23 DIAGNOSIS — Z9889 Other specified postprocedural states: Secondary | ICD-10-CM | POA: Diagnosis not present

## 2016-03-23 DIAGNOSIS — M5126 Other intervertebral disc displacement, lumbar region: Secondary | ICD-10-CM | POA: Diagnosis not present

## 2016-03-23 DIAGNOSIS — M9983 Other biomechanical lesions of lumbar region: Secondary | ICD-10-CM | POA: Diagnosis not present

## 2016-03-23 DIAGNOSIS — M9982 Other biomechanical lesions of thoracic region: Secondary | ICD-10-CM | POA: Diagnosis not present

## 2016-03-25 DIAGNOSIS — Z9181 History of falling: Secondary | ICD-10-CM | POA: Diagnosis not present

## 2016-03-25 DIAGNOSIS — R2689 Other abnormalities of gait and mobility: Secondary | ICD-10-CM | POA: Diagnosis not present

## 2016-03-25 DIAGNOSIS — Z7901 Long term (current) use of anticoagulants: Secondary | ICD-10-CM | POA: Diagnosis not present

## 2016-03-25 DIAGNOSIS — K219 Gastro-esophageal reflux disease without esophagitis: Secondary | ICD-10-CM | POA: Diagnosis not present

## 2016-03-25 DIAGNOSIS — I509 Heart failure, unspecified: Secondary | ICD-10-CM | POA: Diagnosis not present

## 2016-03-25 DIAGNOSIS — M545 Low back pain: Secondary | ICD-10-CM | POA: Diagnosis not present

## 2016-03-25 DIAGNOSIS — G309 Alzheimer's disease, unspecified: Secondary | ICD-10-CM | POA: Diagnosis not present

## 2016-03-25 DIAGNOSIS — R296 Repeated falls: Secondary | ICD-10-CM | POA: Diagnosis not present

## 2016-03-30 DIAGNOSIS — K219 Gastro-esophageal reflux disease without esophagitis: Secondary | ICD-10-CM | POA: Diagnosis not present

## 2016-03-30 DIAGNOSIS — R2689 Other abnormalities of gait and mobility: Secondary | ICD-10-CM | POA: Diagnosis not present

## 2016-03-30 DIAGNOSIS — M545 Low back pain: Secondary | ICD-10-CM | POA: Diagnosis not present

## 2016-03-30 DIAGNOSIS — Z7901 Long term (current) use of anticoagulants: Secondary | ICD-10-CM | POA: Diagnosis not present

## 2016-03-30 DIAGNOSIS — I509 Heart failure, unspecified: Secondary | ICD-10-CM | POA: Diagnosis not present

## 2016-03-30 DIAGNOSIS — G309 Alzheimer's disease, unspecified: Secondary | ICD-10-CM | POA: Diagnosis not present

## 2016-03-30 DIAGNOSIS — Z9181 History of falling: Secondary | ICD-10-CM | POA: Diagnosis not present

## 2016-03-30 DIAGNOSIS — R296 Repeated falls: Secondary | ICD-10-CM | POA: Diagnosis not present

## 2016-03-31 DIAGNOSIS — M545 Low back pain: Secondary | ICD-10-CM | POA: Diagnosis not present

## 2016-03-31 DIAGNOSIS — G309 Alzheimer's disease, unspecified: Secondary | ICD-10-CM | POA: Diagnosis not present

## 2016-03-31 DIAGNOSIS — K219 Gastro-esophageal reflux disease without esophagitis: Secondary | ICD-10-CM | POA: Diagnosis not present

## 2016-03-31 DIAGNOSIS — R2689 Other abnormalities of gait and mobility: Secondary | ICD-10-CM | POA: Diagnosis not present

## 2016-03-31 DIAGNOSIS — Z9181 History of falling: Secondary | ICD-10-CM | POA: Diagnosis not present

## 2016-03-31 DIAGNOSIS — Z7901 Long term (current) use of anticoagulants: Secondary | ICD-10-CM | POA: Diagnosis not present

## 2016-03-31 DIAGNOSIS — R296 Repeated falls: Secondary | ICD-10-CM | POA: Diagnosis not present

## 2016-03-31 DIAGNOSIS — I509 Heart failure, unspecified: Secondary | ICD-10-CM | POA: Diagnosis not present

## 2016-03-31 DIAGNOSIS — R531 Weakness: Secondary | ICD-10-CM | POA: Diagnosis not present

## 2016-04-02 DIAGNOSIS — R2689 Other abnormalities of gait and mobility: Secondary | ICD-10-CM | POA: Diagnosis not present

## 2016-04-02 DIAGNOSIS — Z9181 History of falling: Secondary | ICD-10-CM | POA: Diagnosis not present

## 2016-04-02 DIAGNOSIS — K219 Gastro-esophageal reflux disease without esophagitis: Secondary | ICD-10-CM | POA: Diagnosis not present

## 2016-04-02 DIAGNOSIS — G309 Alzheimer's disease, unspecified: Secondary | ICD-10-CM | POA: Diagnosis not present

## 2016-04-02 DIAGNOSIS — R296 Repeated falls: Secondary | ICD-10-CM | POA: Diagnosis not present

## 2016-04-02 DIAGNOSIS — M545 Low back pain: Secondary | ICD-10-CM | POA: Diagnosis not present

## 2016-04-02 DIAGNOSIS — I509 Heart failure, unspecified: Secondary | ICD-10-CM | POA: Diagnosis not present

## 2016-04-02 DIAGNOSIS — Z7901 Long term (current) use of anticoagulants: Secondary | ICD-10-CM | POA: Diagnosis not present

## 2016-04-05 DIAGNOSIS — I509 Heart failure, unspecified: Secondary | ICD-10-CM | POA: Diagnosis not present

## 2016-04-05 DIAGNOSIS — R296 Repeated falls: Secondary | ICD-10-CM | POA: Diagnosis not present

## 2016-04-05 DIAGNOSIS — Z9181 History of falling: Secondary | ICD-10-CM | POA: Diagnosis not present

## 2016-04-05 DIAGNOSIS — R2689 Other abnormalities of gait and mobility: Secondary | ICD-10-CM | POA: Diagnosis not present

## 2016-04-05 DIAGNOSIS — Z7901 Long term (current) use of anticoagulants: Secondary | ICD-10-CM | POA: Diagnosis not present

## 2016-04-05 DIAGNOSIS — K219 Gastro-esophageal reflux disease without esophagitis: Secondary | ICD-10-CM | POA: Diagnosis not present

## 2016-04-05 DIAGNOSIS — G309 Alzheimer's disease, unspecified: Secondary | ICD-10-CM | POA: Diagnosis not present

## 2016-04-05 DIAGNOSIS — M545 Low back pain: Secondary | ICD-10-CM | POA: Diagnosis not present

## 2016-04-07 DIAGNOSIS — K219 Gastro-esophageal reflux disease without esophagitis: Secondary | ICD-10-CM | POA: Diagnosis not present

## 2016-04-07 DIAGNOSIS — Z9181 History of falling: Secondary | ICD-10-CM | POA: Diagnosis not present

## 2016-04-07 DIAGNOSIS — M545 Low back pain: Secondary | ICD-10-CM | POA: Diagnosis not present

## 2016-04-07 DIAGNOSIS — G309 Alzheimer's disease, unspecified: Secondary | ICD-10-CM | POA: Diagnosis not present

## 2016-04-07 DIAGNOSIS — I509 Heart failure, unspecified: Secondary | ICD-10-CM | POA: Diagnosis not present

## 2016-04-07 DIAGNOSIS — R2689 Other abnormalities of gait and mobility: Secondary | ICD-10-CM | POA: Diagnosis not present

## 2016-04-07 DIAGNOSIS — Z7901 Long term (current) use of anticoagulants: Secondary | ICD-10-CM | POA: Diagnosis not present

## 2016-04-07 DIAGNOSIS — R296 Repeated falls: Secondary | ICD-10-CM | POA: Diagnosis not present

## 2016-04-09 DIAGNOSIS — Z9181 History of falling: Secondary | ICD-10-CM | POA: Diagnosis not present

## 2016-04-09 DIAGNOSIS — K219 Gastro-esophageal reflux disease without esophagitis: Secondary | ICD-10-CM | POA: Diagnosis not present

## 2016-04-09 DIAGNOSIS — R296 Repeated falls: Secondary | ICD-10-CM | POA: Diagnosis not present

## 2016-04-09 DIAGNOSIS — I509 Heart failure, unspecified: Secondary | ICD-10-CM | POA: Diagnosis not present

## 2016-04-09 DIAGNOSIS — R2689 Other abnormalities of gait and mobility: Secondary | ICD-10-CM | POA: Diagnosis not present

## 2016-04-09 DIAGNOSIS — G309 Alzheimer's disease, unspecified: Secondary | ICD-10-CM | POA: Diagnosis not present

## 2016-04-09 DIAGNOSIS — M545 Low back pain: Secondary | ICD-10-CM | POA: Diagnosis not present

## 2016-04-09 DIAGNOSIS — Z7901 Long term (current) use of anticoagulants: Secondary | ICD-10-CM | POA: Diagnosis not present

## 2016-04-12 DIAGNOSIS — R296 Repeated falls: Secondary | ICD-10-CM | POA: Diagnosis not present

## 2016-04-12 DIAGNOSIS — G309 Alzheimer's disease, unspecified: Secondary | ICD-10-CM | POA: Diagnosis not present

## 2016-04-12 DIAGNOSIS — Z9181 History of falling: Secondary | ICD-10-CM | POA: Diagnosis not present

## 2016-04-12 DIAGNOSIS — K219 Gastro-esophageal reflux disease without esophagitis: Secondary | ICD-10-CM | POA: Diagnosis not present

## 2016-04-12 DIAGNOSIS — M545 Low back pain: Secondary | ICD-10-CM | POA: Diagnosis not present

## 2016-04-12 DIAGNOSIS — I509 Heart failure, unspecified: Secondary | ICD-10-CM | POA: Diagnosis not present

## 2016-04-12 DIAGNOSIS — Z7901 Long term (current) use of anticoagulants: Secondary | ICD-10-CM | POA: Diagnosis not present

## 2016-04-12 DIAGNOSIS — R2689 Other abnormalities of gait and mobility: Secondary | ICD-10-CM | POA: Diagnosis not present

## 2016-04-14 DIAGNOSIS — M545 Low back pain: Secondary | ICD-10-CM | POA: Diagnosis not present

## 2016-04-14 DIAGNOSIS — Z7901 Long term (current) use of anticoagulants: Secondary | ICD-10-CM | POA: Diagnosis not present

## 2016-04-14 DIAGNOSIS — Z9181 History of falling: Secondary | ICD-10-CM | POA: Diagnosis not present

## 2016-04-14 DIAGNOSIS — K219 Gastro-esophageal reflux disease without esophagitis: Secondary | ICD-10-CM | POA: Diagnosis not present

## 2016-04-14 DIAGNOSIS — R296 Repeated falls: Secondary | ICD-10-CM | POA: Diagnosis not present

## 2016-04-14 DIAGNOSIS — R2689 Other abnormalities of gait and mobility: Secondary | ICD-10-CM | POA: Diagnosis not present

## 2016-04-14 DIAGNOSIS — I509 Heart failure, unspecified: Secondary | ICD-10-CM | POA: Diagnosis not present

## 2016-04-14 DIAGNOSIS — G309 Alzheimer's disease, unspecified: Secondary | ICD-10-CM | POA: Diagnosis not present

## 2016-04-15 DIAGNOSIS — Z7901 Long term (current) use of anticoagulants: Secondary | ICD-10-CM | POA: Diagnosis not present

## 2016-04-15 DIAGNOSIS — K219 Gastro-esophageal reflux disease without esophagitis: Secondary | ICD-10-CM | POA: Diagnosis not present

## 2016-04-15 DIAGNOSIS — G894 Chronic pain syndrome: Secondary | ICD-10-CM | POA: Diagnosis not present

## 2016-04-15 DIAGNOSIS — M150 Primary generalized (osteo)arthritis: Secondary | ICD-10-CM | POA: Diagnosis not present

## 2016-04-15 DIAGNOSIS — Z9181 History of falling: Secondary | ICD-10-CM | POA: Diagnosis not present

## 2016-04-15 DIAGNOSIS — R2689 Other abnormalities of gait and mobility: Secondary | ICD-10-CM | POA: Diagnosis not present

## 2016-04-15 DIAGNOSIS — G309 Alzheimer's disease, unspecified: Secondary | ICD-10-CM | POA: Diagnosis not present

## 2016-04-15 DIAGNOSIS — M545 Low back pain: Secondary | ICD-10-CM | POA: Diagnosis not present

## 2016-04-15 DIAGNOSIS — R296 Repeated falls: Secondary | ICD-10-CM | POA: Diagnosis not present

## 2016-04-15 DIAGNOSIS — R531 Weakness: Secondary | ICD-10-CM | POA: Diagnosis not present

## 2016-04-15 DIAGNOSIS — I5042 Chronic combined systolic (congestive) and diastolic (congestive) heart failure: Secondary | ICD-10-CM | POA: Diagnosis not present

## 2016-04-15 DIAGNOSIS — I509 Heart failure, unspecified: Secondary | ICD-10-CM | POA: Diagnosis not present

## 2016-04-19 DIAGNOSIS — M545 Low back pain: Secondary | ICD-10-CM | POA: Diagnosis not present

## 2016-04-19 DIAGNOSIS — K219 Gastro-esophageal reflux disease without esophagitis: Secondary | ICD-10-CM | POA: Diagnosis not present

## 2016-04-19 DIAGNOSIS — G309 Alzheimer's disease, unspecified: Secondary | ICD-10-CM | POA: Diagnosis not present

## 2016-04-19 DIAGNOSIS — R2689 Other abnormalities of gait and mobility: Secondary | ICD-10-CM | POA: Diagnosis not present

## 2016-04-19 DIAGNOSIS — Z7901 Long term (current) use of anticoagulants: Secondary | ICD-10-CM | POA: Diagnosis not present

## 2016-04-19 DIAGNOSIS — Z9181 History of falling: Secondary | ICD-10-CM | POA: Diagnosis not present

## 2016-04-19 DIAGNOSIS — R296 Repeated falls: Secondary | ICD-10-CM | POA: Diagnosis not present

## 2016-04-19 DIAGNOSIS — I509 Heart failure, unspecified: Secondary | ICD-10-CM | POA: Diagnosis not present

## 2016-04-21 DIAGNOSIS — K219 Gastro-esophageal reflux disease without esophagitis: Secondary | ICD-10-CM | POA: Diagnosis not present

## 2016-04-21 DIAGNOSIS — Z9181 History of falling: Secondary | ICD-10-CM | POA: Diagnosis not present

## 2016-04-21 DIAGNOSIS — G309 Alzheimer's disease, unspecified: Secondary | ICD-10-CM | POA: Diagnosis not present

## 2016-04-21 DIAGNOSIS — R296 Repeated falls: Secondary | ICD-10-CM | POA: Diagnosis not present

## 2016-04-21 DIAGNOSIS — M545 Low back pain: Secondary | ICD-10-CM | POA: Diagnosis not present

## 2016-04-21 DIAGNOSIS — R2689 Other abnormalities of gait and mobility: Secondary | ICD-10-CM | POA: Diagnosis not present

## 2016-04-21 DIAGNOSIS — Z7901 Long term (current) use of anticoagulants: Secondary | ICD-10-CM | POA: Diagnosis not present

## 2016-04-21 DIAGNOSIS — I509 Heart failure, unspecified: Secondary | ICD-10-CM | POA: Diagnosis not present

## 2016-04-22 DIAGNOSIS — K219 Gastro-esophageal reflux disease without esophagitis: Secondary | ICD-10-CM | POA: Diagnosis not present

## 2016-04-22 DIAGNOSIS — M545 Low back pain: Secondary | ICD-10-CM | POA: Diagnosis not present

## 2016-04-22 DIAGNOSIS — I509 Heart failure, unspecified: Secondary | ICD-10-CM | POA: Diagnosis not present

## 2016-04-22 DIAGNOSIS — Z9181 History of falling: Secondary | ICD-10-CM | POA: Diagnosis not present

## 2016-04-22 DIAGNOSIS — G309 Alzheimer's disease, unspecified: Secondary | ICD-10-CM | POA: Diagnosis not present

## 2016-04-22 DIAGNOSIS — R2689 Other abnormalities of gait and mobility: Secondary | ICD-10-CM | POA: Diagnosis not present

## 2016-04-22 DIAGNOSIS — R296 Repeated falls: Secondary | ICD-10-CM | POA: Diagnosis not present

## 2016-04-22 DIAGNOSIS — Z7901 Long term (current) use of anticoagulants: Secondary | ICD-10-CM | POA: Diagnosis not present

## 2016-04-26 DIAGNOSIS — K219 Gastro-esophageal reflux disease without esophagitis: Secondary | ICD-10-CM | POA: Diagnosis not present

## 2016-04-26 DIAGNOSIS — Z7901 Long term (current) use of anticoagulants: Secondary | ICD-10-CM | POA: Diagnosis not present

## 2016-04-26 DIAGNOSIS — I509 Heart failure, unspecified: Secondary | ICD-10-CM | POA: Diagnosis not present

## 2016-04-26 DIAGNOSIS — R296 Repeated falls: Secondary | ICD-10-CM | POA: Diagnosis not present

## 2016-04-26 DIAGNOSIS — G309 Alzheimer's disease, unspecified: Secondary | ICD-10-CM | POA: Diagnosis not present

## 2016-04-26 DIAGNOSIS — R2689 Other abnormalities of gait and mobility: Secondary | ICD-10-CM | POA: Diagnosis not present

## 2016-04-26 DIAGNOSIS — Z9181 History of falling: Secondary | ICD-10-CM | POA: Diagnosis not present

## 2016-04-26 DIAGNOSIS — M545 Low back pain: Secondary | ICD-10-CM | POA: Diagnosis not present

## 2016-04-28 DIAGNOSIS — M810 Age-related osteoporosis without current pathological fracture: Secondary | ICD-10-CM | POA: Diagnosis not present

## 2016-04-28 DIAGNOSIS — G8929 Other chronic pain: Secondary | ICD-10-CM | POA: Diagnosis not present

## 2016-04-28 DIAGNOSIS — M545 Low back pain: Secondary | ICD-10-CM | POA: Diagnosis not present

## 2016-04-28 DIAGNOSIS — S22000A Wedge compression fracture of unspecified thoracic vertebra, initial encounter for closed fracture: Secondary | ICD-10-CM | POA: Diagnosis not present

## 2016-04-28 DIAGNOSIS — G894 Chronic pain syndrome: Secondary | ICD-10-CM | POA: Diagnosis not present

## 2016-04-29 DIAGNOSIS — Z7901 Long term (current) use of anticoagulants: Secondary | ICD-10-CM | POA: Diagnosis not present

## 2016-04-29 DIAGNOSIS — I509 Heart failure, unspecified: Secondary | ICD-10-CM | POA: Diagnosis not present

## 2016-04-29 DIAGNOSIS — M545 Low back pain: Secondary | ICD-10-CM | POA: Diagnosis not present

## 2016-04-29 DIAGNOSIS — R296 Repeated falls: Secondary | ICD-10-CM | POA: Diagnosis not present

## 2016-04-29 DIAGNOSIS — G309 Alzheimer's disease, unspecified: Secondary | ICD-10-CM | POA: Diagnosis not present

## 2016-04-29 DIAGNOSIS — R2689 Other abnormalities of gait and mobility: Secondary | ICD-10-CM | POA: Diagnosis not present

## 2016-04-29 DIAGNOSIS — Z9181 History of falling: Secondary | ICD-10-CM | POA: Diagnosis not present

## 2016-04-29 DIAGNOSIS — K219 Gastro-esophageal reflux disease without esophagitis: Secondary | ICD-10-CM | POA: Diagnosis not present

## 2016-05-03 DIAGNOSIS — Z7901 Long term (current) use of anticoagulants: Secondary | ICD-10-CM | POA: Diagnosis not present

## 2016-05-03 DIAGNOSIS — Z9181 History of falling: Secondary | ICD-10-CM | POA: Diagnosis not present

## 2016-05-03 DIAGNOSIS — G309 Alzheimer's disease, unspecified: Secondary | ICD-10-CM | POA: Diagnosis not present

## 2016-05-03 DIAGNOSIS — R2689 Other abnormalities of gait and mobility: Secondary | ICD-10-CM | POA: Diagnosis not present

## 2016-05-03 DIAGNOSIS — K219 Gastro-esophageal reflux disease without esophagitis: Secondary | ICD-10-CM | POA: Diagnosis not present

## 2016-05-03 DIAGNOSIS — I509 Heart failure, unspecified: Secondary | ICD-10-CM | POA: Diagnosis not present

## 2016-05-03 DIAGNOSIS — M545 Low back pain: Secondary | ICD-10-CM | POA: Diagnosis not present

## 2016-05-03 DIAGNOSIS — R296 Repeated falls: Secondary | ICD-10-CM | POA: Diagnosis not present

## 2016-05-06 DIAGNOSIS — R2689 Other abnormalities of gait and mobility: Secondary | ICD-10-CM | POA: Diagnosis not present

## 2016-05-06 DIAGNOSIS — K219 Gastro-esophageal reflux disease without esophagitis: Secondary | ICD-10-CM | POA: Diagnosis not present

## 2016-05-06 DIAGNOSIS — G309 Alzheimer's disease, unspecified: Secondary | ICD-10-CM | POA: Diagnosis not present

## 2016-05-06 DIAGNOSIS — R296 Repeated falls: Secondary | ICD-10-CM | POA: Diagnosis not present

## 2016-05-06 DIAGNOSIS — M545 Low back pain: Secondary | ICD-10-CM | POA: Diagnosis not present

## 2016-05-06 DIAGNOSIS — I509 Heart failure, unspecified: Secondary | ICD-10-CM | POA: Diagnosis not present

## 2016-05-06 DIAGNOSIS — Z7901 Long term (current) use of anticoagulants: Secondary | ICD-10-CM | POA: Diagnosis not present

## 2016-05-06 DIAGNOSIS — Z9181 History of falling: Secondary | ICD-10-CM | POA: Diagnosis not present

## 2016-05-12 DIAGNOSIS — G894 Chronic pain syndrome: Secondary | ICD-10-CM | POA: Diagnosis not present

## 2016-05-12 DIAGNOSIS — E039 Hypothyroidism, unspecified: Secondary | ICD-10-CM | POA: Diagnosis not present

## 2016-05-12 DIAGNOSIS — M150 Primary generalized (osteo)arthritis: Secondary | ICD-10-CM | POA: Diagnosis not present

## 2016-05-12 DIAGNOSIS — K219 Gastro-esophageal reflux disease without esophagitis: Secondary | ICD-10-CM | POA: Diagnosis not present

## 2016-05-12 DIAGNOSIS — I10 Essential (primary) hypertension: Secondary | ICD-10-CM | POA: Diagnosis not present

## 2016-05-12 DIAGNOSIS — I5042 Chronic combined systolic (congestive) and diastolic (congestive) heart failure: Secondary | ICD-10-CM | POA: Diagnosis not present

## 2016-05-12 DIAGNOSIS — R531 Weakness: Secondary | ICD-10-CM | POA: Diagnosis not present

## 2016-05-26 DIAGNOSIS — G894 Chronic pain syndrome: Secondary | ICD-10-CM | POA: Diagnosis not present

## 2016-06-09 DIAGNOSIS — G894 Chronic pain syndrome: Secondary | ICD-10-CM | POA: Diagnosis not present

## 2016-06-09 DIAGNOSIS — I10 Essential (primary) hypertension: Secondary | ICD-10-CM | POA: Diagnosis not present

## 2016-06-09 DIAGNOSIS — E039 Hypothyroidism, unspecified: Secondary | ICD-10-CM | POA: Diagnosis not present

## 2016-06-09 DIAGNOSIS — K219 Gastro-esophageal reflux disease without esophagitis: Secondary | ICD-10-CM | POA: Diagnosis not present

## 2016-06-09 DIAGNOSIS — I5042 Chronic combined systolic (congestive) and diastolic (congestive) heart failure: Secondary | ICD-10-CM | POA: Diagnosis not present

## 2016-06-16 DIAGNOSIS — E039 Hypothyroidism, unspecified: Secondary | ICD-10-CM | POA: Diagnosis not present

## 2016-06-16 DIAGNOSIS — E119 Type 2 diabetes mellitus without complications: Secondary | ICD-10-CM | POA: Diagnosis not present

## 2016-06-16 DIAGNOSIS — I10 Essential (primary) hypertension: Secondary | ICD-10-CM | POA: Diagnosis not present

## 2016-06-23 DIAGNOSIS — G894 Chronic pain syndrome: Secondary | ICD-10-CM | POA: Diagnosis not present

## 2016-07-06 DIAGNOSIS — R350 Frequency of micturition: Secondary | ICD-10-CM | POA: Diagnosis not present

## 2016-07-06 DIAGNOSIS — Z6824 Body mass index (BMI) 24.0-24.9, adult: Secondary | ICD-10-CM | POA: Diagnosis not present

## 2016-07-06 DIAGNOSIS — Z299 Encounter for prophylactic measures, unspecified: Secondary | ICD-10-CM | POA: Diagnosis not present

## 2016-07-06 DIAGNOSIS — Z713 Dietary counseling and surveillance: Secondary | ICD-10-CM | POA: Diagnosis not present

## 2016-07-06 DIAGNOSIS — Z87891 Personal history of nicotine dependence: Secondary | ICD-10-CM | POA: Diagnosis not present

## 2016-07-06 DIAGNOSIS — Z7689 Persons encountering health services in other specified circumstances: Secondary | ICD-10-CM | POA: Diagnosis not present

## 2016-07-16 DIAGNOSIS — G894 Chronic pain syndrome: Secondary | ICD-10-CM | POA: Diagnosis not present

## 2016-07-23 DIAGNOSIS — M810 Age-related osteoporosis without current pathological fracture: Secondary | ICD-10-CM | POA: Diagnosis not present

## 2016-07-30 ENCOUNTER — Ambulatory Visit (INDEPENDENT_AMBULATORY_CARE_PROVIDER_SITE_OTHER): Payer: Medicare Other | Admitting: *Deleted

## 2016-07-30 ENCOUNTER — Encounter: Payer: Self-pay | Admitting: Internal Medicine

## 2016-07-30 DIAGNOSIS — Z950 Presence of cardiac pacemaker: Secondary | ICD-10-CM

## 2016-07-30 DIAGNOSIS — Z7689 Persons encountering health services in other specified circumstances: Secondary | ICD-10-CM | POA: Diagnosis not present

## 2016-07-30 LAB — CUP PACEART INCLINIC DEVICE CHECK
Battery Impedance: 2374 Ohm
Battery Voltage: 2.76 V
Brady Statistic AP VS Percent: 0 %
Brady Statistic AS VP Percent: 91 %
Implantable Lead Implant Date: 20000908
Implantable Lead Location: 753860
Implantable Lead Model: 4285
Implantable Lead Serial Number: 282090
Lead Channel Impedance Value: 409 Ohm
Lead Channel Pacing Threshold Amplitude: 0.5 V
Lead Channel Pacing Threshold Pulse Width: 0.4 ms
Lead Channel Sensing Intrinsic Amplitude: 1.4 mV
Lead Channel Setting Pacing Amplitude: 2.5 V
MDC IDC LEAD IMPLANT DT: 20000908
MDC IDC LEAD LOCATION: 753859
MDC IDC MSMT BATTERY REMAINING LONGEVITY: 27 mo
MDC IDC MSMT LEADCHNL RV IMPEDANCE VALUE: 934 Ohm
MDC IDC MSMT LEADCHNL RV PACING THRESHOLD AMPLITUDE: 0.75 V
MDC IDC MSMT LEADCHNL RV PACING THRESHOLD PULSEWIDTH: 0.4 ms
MDC IDC SESS DTM: 20170804133926
MDC IDC SET LEADCHNL RA PACING AMPLITUDE: 2 V
MDC IDC SET LEADCHNL RV PACING PULSEWIDTH: 0.4 ms
MDC IDC SET LEADCHNL RV SENSING SENSITIVITY: 2.8 mV
MDC IDC STAT BRADY AP VP PERCENT: 9 %
MDC IDC STAT BRADY AS VS PERCENT: 0 %

## 2016-07-30 NOTE — Progress Notes (Signed)
Pacemaker check in clinic. Normal device function. Thresholds, sensing, impedances consistent with previous measurements. Device programmed to maximize longevity. Atrial lead warning from March, 2017. Upon interrogation sensing and thresholds stable. 94 mode switches, no AHR. No or high ventricular rates noted. Device programmed at appropriate safety margins. Histogram distribution appropriate for patient activity level. Device programmed to optimize intrinsic conduction. Estimated longevity 9-72mo. ROV with JA in 48mo.

## 2016-08-06 DIAGNOSIS — Z7689 Persons encountering health services in other specified circumstances: Secondary | ICD-10-CM | POA: Diagnosis not present

## 2016-08-06 DIAGNOSIS — M810 Age-related osteoporosis without current pathological fracture: Secondary | ICD-10-CM | POA: Diagnosis not present

## 2016-08-06 DIAGNOSIS — M545 Low back pain: Secondary | ICD-10-CM | POA: Diagnosis not present

## 2016-08-23 ENCOUNTER — Encounter: Payer: Self-pay | Admitting: Cardiology

## 2016-08-23 ENCOUNTER — Ambulatory Visit (INDEPENDENT_AMBULATORY_CARE_PROVIDER_SITE_OTHER): Payer: Medicare Other | Admitting: Cardiology

## 2016-08-23 VITALS — BP 128/77 | HR 67 | Ht 65.0 in | Wt 145.0 lb

## 2016-08-23 DIAGNOSIS — I25119 Atherosclerotic heart disease of native coronary artery with unspecified angina pectoris: Secondary | ICD-10-CM

## 2016-08-23 DIAGNOSIS — Z950 Presence of cardiac pacemaker: Secondary | ICD-10-CM

## 2016-08-23 DIAGNOSIS — I10 Essential (primary) hypertension: Secondary | ICD-10-CM

## 2016-08-23 DIAGNOSIS — Z7689 Persons encountering health services in other specified circumstances: Secondary | ICD-10-CM | POA: Diagnosis not present

## 2016-08-23 NOTE — Patient Instructions (Signed)

## 2016-08-23 NOTE — Progress Notes (Signed)
Cardiology Office Note  Date: 08/23/2016   ID: Cynthia [name], DOB November 26, 1931, MRN [id number]  PCP: Wendall Papa  Primary Cardiologist: Nona Dell, MD   Chief Complaint  Patient presents with  . Coronary Artery Disease    History of Present Illness: Cynthia [name] is an 82 y.o. female last seen in October 2016. She presents for a routine follow-up visit. Currently lives in [place]. She does not report any angina but complains of lack of energy. States that she has not been sleeping well because her roommate with dementia is up a lot of the night time.  She continues to follow with Dr. Johney Frame in the device clinic, Medtronic pacemaker in place. Recent interrogation showed normal device function.  She is status post placement of DES to the LAD in March 2014 secondary to in-stent restenosis. I reviewed her ECG today which shows a ventricular paced rhythm with atrial tracking.  I reviewed her medications. Cardiac regimen includes Plavix, Lasix, and KCl.  Past Medical History:  Diagnosis Date  . Arthritis   . Cataracts, bilateral   . Chronic diastolic heart failure (HCC)   . Complete heart block (HCC)    a. s/p Medtronic PPM  . Coronary atherosclerosis of native coronary artery    a. DES to LAD 2/05. b. DES to LAD 02/2013 for ISR.  Marland Kitchen Depression   . GERD (gastroesophageal reflux disease)   . Glaucoma   . Hyperlipidemia   . Hypothyroidism   . Ischemic cardiomyopathy    a. EF 45-50% by cath 03/01/13.  . Sarcoidosis [facility])     Current Outpatient Prescriptions  Medication Sig Dispense Refill  . acetaminophen (TYLENOL) 650 MG CR tablet Take 650 mg by mouth at bedtime.    . calcium-vitamin D (OSCAL 500/200 D-3) 500-200 MG-UNIT tablet Take 1 tablet by mouth 2 (two) times daily.    . Cholecalciferol (VITAMIN D-3) 1000 UNITS CAPS Take 1 capsule by mouth daily.    . clopidogrel (PLAVIX) 75 MG tablet TAKE ONE TABLET BY MOUTH DAILY WITH BREAKFAST 30 tablet 6  . docusate  sodium (COLACE) 100 MG capsule Take 100 mg by mouth 2 (two) times daily.    Marland Kitchen donepezil (ARICEPT) 10 MG tablet Take 10 mg by mouth at bedtime.    . dorzolamide-timolol (COSOPT) 22.3-6.8 MG/ML ophthalmic solution Place 1 drop into the left eye 2 (two) times daily.     . DULoxetine (CYMBALTA) 60 MG capsule Take 60 mg by mouth daily.    . furosemide (LASIX) 20 MG tablet Take 20 mg by mouth daily.    Marland Kitchen HYDROcodone-acetaminophen (NORCO/VICODIN) 5-325 MG tablet Take 1 tablet by mouth every 8 (eight) hours as needed for moderate pain.    Marland Kitchen lactulose (CHRONULAC) 10 GM/15ML solution Take 10 g by mouth daily as needed for mild constipation.    Marland Kitchen levothyroxine (SYNTHROID, LEVOTHROID) 75 MCG tablet Take 75 mcg by mouth daily before breakfast.    . Multiple Vitamin (MULTIVITAMIN) tablet Take 1 tablet by mouth daily.    . pantoprazole (PROTONIX) 20 MG tablet Take 20 mg by mouth daily.    . polyethylene glycol (MIRALAX / GLYCOLAX) packet Take 17 g by mouth daily as needed.    . potassium chloride SA (K-DUR,KLOR-CON) 20 MEQ tablet Take 20 mEq by mouth daily.    . ranitidine (ZANTAC) 150 MG tablet Take 150 mg by mouth at bedtime.     Marland Kitchen tiZANidine (ZANAFLEX) 2 MG tablet Take 2 mg by mouth every 8 (  eight) hours as needed for muscle spasms.     No current facility-administered medications for this visit.    Allergies:  Statins   Social History: The patient  reports that she has never smoked. She has never used smokeless tobacco. She reports that she does not drink alcohol or use drugs.   ROS:  Please see the history of present illness. Otherwise, complete review of systems is positive for insomnia.  All other systems are reviewed and negative.   Physical Exam: VS:  BP 128/77   Pulse 67   Ht 5\' 5"  (1.651 m)   Wt 145 lb (65.8 kg)   BMI 24.13 kg/m , BMI Body mass index is 24.13 kg/m.  Wt Readings from Last 3 Encounters:  08/23/16 145 lb (65.8 kg)  01/30/16 140 lb 12.8 oz (63.9 kg)  10/27/15 143 lb 12.8  oz (65.2 kg)    Elderly woman, appears comfortable at rest.  HEENT: Conjunctiva and lids normal, oropharynx moist mucosa.  Neck: Supple, no carotid bruits, no thyromegaly.  Lungs: Clear to auscultation, nonlabored.  Cardiac: Regular rate and rhythm.  Thorax: Stable device pocket site.  Extremities: No pitting edema.   ECG: I personally reviewed the tracing from 04/12/2015 which showed sinus rhythm with ventricular pacing and PVCs.  Recent Labwork:  April 2016: BUN 22, creatinine 1.5, potassium 3.8, AST 29, ALT 13, TSH 2.5, hemoglobin 13.5, platelets 154  Other Studies Reviewed Today:  Echocardiogram 01/20/2015 [facility]([place]): Mild LVH with LVEF 55-60%, no comment on diastolic function, pacing wire visualized in right heart, mild to moderate aortic stenosis, RVSP 42 mmHg, no pericardial effusion.  Assessment and Plan:  1. CAD status post DES to the LAD in 2005 with DES to the LAD in 2014 secondary to in-stent restenosis. No reported angina at this time. Blood pressure and heart rate are well controlled today. Would continue observation at this point.  2. Complete heart block status post Medtronic pacemaker, followed by Dr. Johney FrameAllred.  Current medicines were reviewed with the patient today.   Orders Placed This Encounter  Procedures  . EKG 12-Lead    Disposition: Follow-up with me in 6 months.  Signed, Jonelle SidleSamuel G. McDowell, MD, [facility] 08/23/2016 2:04 PM    [facility] [address], [place], [place] [address] Phone: [phone]; Fax: [phone]

## 2016-09-12 ENCOUNTER — Emergency Department (HOSPITAL_COMMUNITY)
Admission: EM | Admit: 2016-09-12 | Discharge: 2016-09-13 | Disposition: A | Discharge status: Home / Self Care | Payer: Medicare Other | Source: Home / Self Care | Attending: Emergency Medicine | Admitting: Emergency Medicine

## 2016-09-12 ENCOUNTER — Emergency Department (HOSPITAL_COMMUNITY): Payer: Medicare Other

## 2016-09-12 ENCOUNTER — Encounter (HOSPITAL_COMMUNITY): Payer: Self-pay | Admitting: *Deleted

## 2016-09-12 DIAGNOSIS — H547 Unspecified visual loss: Secondary | ICD-10-CM | POA: Diagnosis not present

## 2016-09-12 DIAGNOSIS — I5032 Chronic diastolic (congestive) heart failure: Secondary | ICD-10-CM | POA: Insufficient documentation

## 2016-09-12 DIAGNOSIS — R51 Headache: Secondary | ICD-10-CM

## 2016-09-12 DIAGNOSIS — Z79899 Other long term (current) drug therapy: Secondary | ICD-10-CM | POA: Insufficient documentation

## 2016-09-12 DIAGNOSIS — I110 Hypertensive heart disease with heart failure: Secondary | ICD-10-CM | POA: Insufficient documentation

## 2016-09-12 DIAGNOSIS — E039 Hypothyroidism, unspecified: Secondary | ICD-10-CM

## 2016-09-12 DIAGNOSIS — H579 Unspecified disorder of eye and adnexa: Secondary | ICD-10-CM | POA: Diagnosis not present

## 2016-09-12 LAB — CBC WITH DIFFERENTIAL/PLATELET
BASOS ABS: 0 10*3/uL (ref 0.0–0.1)
Basophils Relative: 0 %
EOS ABS: 0.2 10*3/uL (ref 0.0–0.7)
Eosinophils Relative: 3 %
HCT: 38.6 % (ref 36.0–46.0)
HEMOGLOBIN: 12.4 g/dL (ref 12.0–15.0)
LYMPHS ABS: 1.2 10*3/uL (ref 0.7–4.0)
Lymphocytes Relative: 20 %
MCH: 29.9 pg (ref 26.0–34.0)
MCHC: 32.1 g/dL (ref 30.0–36.0)
MCV: 93 fL (ref 78.0–100.0)
Monocytes Absolute: 0.4 10*3/uL (ref 0.1–1.0)
Monocytes Relative: 6 %
NEUTROS PCT: 71 %
Neutro Abs: 4.1 10*3/uL (ref 1.7–7.7)
Platelets: 214 10*3/uL (ref 150–400)
RBC: 4.15 MIL/uL (ref 3.87–5.11)
RDW: 13.4 % (ref 11.5–15.5)
WBC: 5.8 10*3/uL (ref 4.0–10.5)

## 2016-09-12 LAB — BASIC METABOLIC PANEL
Anion gap: 6 (ref 5–15)
BUN: 25 mg/dL — ABNORMAL HIGH (ref 6–20)
CHLORIDE: 107 mmol/L (ref 101–111)
CO2: 26 mmol/L (ref 22–32)
CREATININE: 1.82 mg/dL — AB (ref 0.44–1.00)
Calcium: 9.1 mg/dL (ref 8.9–10.3)
GFR calc non Af Amer: 25 mL/min — ABNORMAL LOW (ref >60)
GFR, EST AFRICAN AMERICAN: 28 mL/min — AB (ref >60)
Glucose, Bld: 128 mg/dL — ABNORMAL HIGH (ref 65–99)
POTASSIUM: 4 mmol/L (ref 3.5–5.1)
SODIUM: 139 mmol/L (ref 135–145)

## 2016-09-12 MED ORDER — TETRACAINE HCL 0.5 % OP SOLN
2.0000 [drp] | Freq: Once | OPHTHALMIC | Status: AC
  Administered 2016-09-12: 2 [drp] via OPHTHALMIC
  Filled 2016-09-12: qty 4

## 2016-09-12 MED ORDER — LORAZEPAM 2 MG/ML IJ SOLN: 1.5000 mg | Freq: Once | INTRAMUSCULAR | Status: DC

## 2016-09-12 NOTE — Discharge Instructions (Signed)
Follow-up with Dr. Sherryll BurgerShah tomorrow morning at 7:45 AM at his office at [facility]. [facility] at [facility].

## 2016-09-12 NOTE — ED Provider Notes (Signed)
[facility]-EMERGENCY DEPT Provider Note   CSN: [id number] Arrival date & time: 09/12/16  2032  By signing my name below, I, Alyssa Grove, attest that this documentation has been prepared under the direction and in the presence of Geoffery Lyons, MD. Electronically Signed: Alyssa Grove, ED Scribe. 09/12/16. 9:02 PM.   History   Chief Complaint Chief Complaint  Patient presents with  . Eye Problem   The history is provided by the patient. No language interpreter was used.  Eye Problem   This is a new problem. The current episode started 12 to 24 hours ago. The problem occurs constantly. The problem has not changed since onset.There is a problem in the left eye. There was no injury mechanism. Associated symptoms include decreased vision. Cynthia [name] has tried eye drops for the symptoms. The treatment provided no relief.   HPI Comments: Cynthia REDMON is a 82 y.o. female with PMHx of Glaucoma, GERD, HLD and Bilateral Cataracts who presents to the Emergency Department complaining of decreased vision onset noon today. Pt states her vision was fine this morning, but after taking a nap around 8:30 AM and waking at lunch time today Cynthia [name] was unable to see out of her left eye. Her vision in her right eye has also been affected, but not to the severity of her left eye. Cynthia [name] states her vision is "foggy" or blurry. Cynthia [name] also reported headaches this morning. Cynthia [name] normally experiences headaches about 1x a month. Cynthia [name] states Cynthia [name] has had surgery on her left eye in the past. Pt states Cynthia [name] placed 2 drops in her eyes today with no relief to symptoms. Pt is currently in a nursing home and is unaware of the medications Cynthia [name] is currently taking.  Past Medical History:  Diagnosis Date  . Arthritis   . Cataracts, bilateral   . Chronic diastolic heart failure (HCC)   . Complete heart block (HCC)    a. s/p Medtronic PPM  . Coronary atherosclerosis of native coronary artery    a. DES to LAD 2/05. b. DES to LAD 02/2013 for ISR.  Marland Kitchen  Depression   . GERD (gastroesophageal reflux disease)   . Glaucoma   . Hyperlipidemia   . Hypothyroidism   . Ischemic cardiomyopathy    a. EF 45-50% by cath 03/01/13.  . Sarcoidosis [facility])     Patient Active Problem List   Diagnosis Date Noted  . Status post placement of cardiac pacemaker 06/20/2013  . Valvular heart disease 03/16/2013  . Renal insufficiency 03/16/2013  . Cardiomyopathy, ischemic 03/02/2013  . SARCOIDOSIS 10/10/2008  . HYPERLIPIDEMIA-MIXED 10/10/2008  . HYPERTENSION, BENIGN 10/10/2008  . CAD, NATIVE VESSEL 10/10/2008  . AV BLOCK, COMPLETE 10/10/2008    Past Surgical History:  Procedure Laterality Date  . ANTERIOR CERVICAL DISCECTOMY     Cervical fusion  . cataracts     bilateral  . COLONOSCOPY    . GALLBLADDER SURGERY    . LUMBAR LAMINECTOMY     L3 S1  . PACEMAKER INSERTION  2000, 2007   Medtronic  . PERCUTANEOUS CORONARY STENT INTERVENTION (PCI-S) Left 03/01/13  . PERCUTANEOUS CORONARY STENT INTERVENTION (PCI-S) N/A 03/01/2013   Procedure: PERCUTANEOUS CORONARY STENT INTERVENTION (PCI-S);  Surgeon: Kathleene Hazel, MD;  Location[facility] C[facility] LAB;  Service: Cardiovascular;  Laterality: N/A;  . UPPER GASTROINTESTINAL ENDOSCOPY      OB History    No data available       Home Medications    Prior to Admission medications   Medication Sig Start Date  End Date Taking? Authorizing Provider  acetaminophen (TYLENOL) 650 MG CR tablet Take 650 mg by mouth at bedtime. *May take one tablet every 6 hours as needed for pain   Yes Historical Provider, MD  calcium-vitamin D (OSCAL 500/200 D-3) 500-200 MG-UNIT tablet Take 1 tablet by mouth 2 (two) times daily.   Yes Historical Provider, MD  Cholecalciferol (VITAMIN D-3) 1000 UNITS CAPS Take 1 capsule by mouth daily.   Yes Historical Provider, MD  clopidogrel (PLAVIX) 75 MG tablet TAKE ONE TABLET BY MOUTH DAILY WITH BREAKFAST 11/15/14  Yes Hillis Range, MD  docusate sodium (COLACE) 100 MG capsule Take 200 mg by mouth  daily.    Yes Historical Provider, MD  donepezil (ARICEPT) 10 MG tablet Take 10 mg by mouth at bedtime.   Yes Historical Provider, MD  dorzolamide-timolol (COSOPT) 22.3-6.8 MG/ML ophthalmic solution Place 1 drop into the left eye 2 (two) times daily.  12/29/12  Yes Historical Provider, MD  DULoxetine (CYMBALTA) 60 MG capsule Take 60 mg by mouth daily.   Yes Historical Provider, MD  furosemide (LASIX) 20 MG tablet Take 20 mg by mouth daily.   Yes Historical Provider, MD  HYDROcodone-acetaminophen (NORCO/VICODIN) 5-325 MG tablet Take 1 tablet by mouth every 8 (eight) hours as needed for moderate pain.   Yes Historical Provider, MD  lactulose (CHRONULAC) 10 GM/15ML solution Take 10 g by mouth daily as needed for mild constipation.   Yes Historical Provider, MD  levothyroxine (SYNTHROID, LEVOTHROID) 75 MCG tablet Take 75 mcg by mouth daily before breakfast.   Yes Historical Provider, MD  Multiple Vitamin (MULTIVITAMIN WITH MINERALS) TABS tablet Take 1 tablet by mouth daily.   Yes Historical Provider, MD  pantoprazole (PROTONIX) 20 MG tablet Take 20 mg by mouth daily.   Yes Historical Provider, MD  polyethylene glycol (MIRALAX / GLYCOLAX) packet Take 17 g by mouth daily as needed.   Yes Historical Provider, MD  potassium chloride SA (K-DUR,KLOR-CON) 20 MEQ tablet Take 20 mEq by mouth daily.   Yes Historical Provider, MD  ranitidine (ZANTAC) 150 MG tablet Take 150 mg by mouth at bedtime.    Yes Historical Provider, MD  tiZANidine (ZANAFLEX) 2 MG tablet Take 2 mg by mouth every 8 (eight) hours as needed for muscle spasms.   Yes Historical Provider, MD    Family History Family History  Problem Relation Age of Onset  . Diabetes Son   . Heart attack Mother   . Heart attack Father   . Colon cancer Neg Hx     Social History Social History  Substance Use Topics  . Smoking status: Never Smoker  . Smokeless tobacco: Never Used  . Alcohol use No     Allergies   Statins   Review of Systems Review  of Systems A complete 10 system review of systems was obtained and all systems are negative except as noted in the HPI and PMH.    Physical Exam Updated Vital Signs Ht 5\' 4"  (1.626 m)   Wt 140 lb (63.5 kg)   BMI 24.03 kg/m   Physical Exam  Constitutional: Cynthia [name] is oriented to person, place, and time. Cynthia [name] appears well-developed and well-nourished. No distress.  HENT:  Head: Normocephalic and atraumatic.  Right Ear: Hearing normal.  Left Ear: Hearing normal.  Nose: Nose normal.  Mouth/Throat: Oropharynx is clear and moist and mucous membranes are normal.  Eyes: Conjunctivae are normal.  The right eye has a pupil which is 2 mm and reactive. The left pupil is  somewhat elliptical in appearance and non reactive. Cynthia [name] is unable to identify the number of fingers held up with her left eye, however no difficulty with the right.  Funduscopic exam was limited. I am unable to appreciate any obvious pathology.  Neck: Normal range of motion. Neck supple.  Cardiovascular: Regular rhythm, S1 normal and S2 normal.  Exam reveals no gallop and no friction rub.   No murmur heard. Pulmonary/Chest: Effort normal and breath sounds normal. No respiratory distress. Cynthia [name] exhibits no tenderness.  Abdominal: Soft. Normal appearance and bowel sounds are normal. There is no hepatosplenomegaly. There is no tenderness. There is no rebound, no guarding, no tenderness at McBurney's point and negative Murphy's sign. No hernia.  Musculoskeletal: Normal range of motion.  Neurological: Cynthia [name] is alert and oriented to person, place, and time. Cynthia [name] has normal strength. No cranial nerve deficit or sensory deficit. Cynthia [name] exhibits normal muscle tone. Coordination normal. GCS eye subscore is 4. GCS verbal subscore is 5. GCS motor subscore is 6.  Skin: Skin is warm, dry and intact. No rash noted. No cyanosis.  Psychiatric: Cynthia [name] has a normal mood and affect. Her speech is normal and behavior is normal. Thought content normal.  Nursing note  and vitals reviewed.  ED Treatments / Results  DIAGNOSTIC STUDIES: Oxygen Saturation is 98% on RA, normal by my interpretation.    COORDINATION OF CARE: 8:59 PM Discussed treatment plan with pt at bedside which includes CT Head and pt agreed to plan.  Labs (all labs ordered are listed, but only abnormal results are displayed) Labs Reviewed  BASIC METABOLIC PANEL - Abnormal; Notable for the following:       Result Value   Glucose, Bld 128 (*)    BUN 25 (*)    Creatinine, Ser 1.82 (*)    GFR calc non Af Amer 25 (*)    GFR calc Af Amer 28 (*)    All other components within normal limits  CBC WITH DIFFERENTIAL/PLATELET    EKG  EKG Interpretation  Date/Time:  Sunday September 12 2016 20:38:04 EDT Ventricular Rate:  68 PR Interval:    QRS Duration: 163 QT Interval:  461 QTC Calculation: 491 R Axis:   -77 Text Interpretation:  Atrial-sensed ventricular-paced rhythm No further analysis attempted due to paced rhythm Baseline wander in lead(s) III Confirmed by Wylder Macomber  MD, Sindi Beckworth ([id number]) on 09/12/2016 11:10:20 PM       Radiology Ct Head Wo Contrast  Result Date: 09/12/2016 CLINICAL DATA:  Acute onset of decreased vision, worse on the left. Headache. Initial encounter. EXAM: CT HEAD WITHOUT CONTRAST TECHNIQUE: Contiguous axial images were obtained from the base of the skull through the vertex without intravenous contrast. COMPARISON:  CT of the head performed 06/17/2007 FINDINGS: Brain: No evidence of acute infarction, hemorrhage, hydrocephalus, extra-axial collection or mass lesion/mass effect. Prominence of the ventricles and sulci reflects mild to moderate cortical volume loss. Mild cerebellar atrophy is noted. Mild periventricular and subcortical white matter change likely reflects small vessel ischemic microangiopathy. The brainstem and fourth ventricle are within normal limits. The basal ganglia are unremarkable in appearance. The cerebral hemispheres demonstrate grossly normal  gray-white differentiation. No mass effect or midline shift is seen. Vascular: No hyperdense vessel or unexpected calcification. Skull: There is no evidence of fracture; visualized osseous structures are unremarkable in appearance. Sinuses/Orbits: The visualized portions of the orbits are within normal limits. The paranasal sinuses and mastoid air cells are well-aerated. Other: No significant soft tissue abnormalities are seen. IMPRESSION: 1. No  acute intracranial pathology seen on CT. 2. Mild to moderate cortical volume loss and scattered small vessel ischemic microangiopathy. Electronically Signed   By: Roanna Raider M.D.   On: 09/12/2016 22:00    Procedures Procedures (including critical care time)  Medications Ordered in ED Medications  tetracaine (PONTOCAINE) 0.5 % ophthalmic solution 2 drop (2 drops Right Eye Given by Other 09/12/16 2221)     Initial Impression / Assessment and Plan / ED Course  I have reviewed the triage vital signs and the nursing notes.  Pertinent labs & imaging results that were available during my care of the patient were reviewed by me and considered in my medical decision making (see chart for details).  Clinical Course  Patient presents here with complaints of visual loss in the left eye. This started approximately 14 hours prior to presentation. Cynthia [name] has a history of glaucoma and has had surgery to correct this in the past.  Her left pupil is somewhat elliptical in shape and her vision is decreased. Eye pressures are unable to be obtained secondary to a malfunctioning Tono-Pen.   CT of the head is unremarkable and laboratory studies are unremarkable as well.  I have discussed this case with Dr. Sherryll Burger from [facility]. He is recommending prompt follow-up with ophthalmology. He will see the patient tomorrow at 7:45 AM at his office. Family is willing to take her tomorrow to be evaluated.   I personally performed the services described in this  documentation, which was scribed in my presence. The recorded information has been reviewed and is accurate.     Final Clinical Impressions(s) / ED Diagnoses   Final diagnoses:  None    New Prescriptions New Prescriptions   No medications on file     Geoffery Lyons, MD 09/12/16 2316

## 2016-09-12 NOTE — ED Notes (Signed)
Pt says unable to see out of the left & having more trouble than normal out of the right. On exam pt eyes are not moving together. Left pupil does not respond to light & is not circular, pt says she has had surgery on that eye in the past. Right pupil does respond to light.

## 2016-09-12 NOTE — ED Notes (Signed)
Pt states no change in vision denies pain. No needs voiced.

## 2016-09-12 NOTE — ED Triage Notes (Signed)
[facility] [place]. To says she can not see out of her left eye. Was able to see around 0830 this morning had a headache was given norco & laid down,  got up at 1200 and could not see.

## 2016-09-13 DIAGNOSIS — H4901 Third [oculomotor] nerve palsy, right eye: Secondary | ICD-10-CM | POA: Diagnosis not present

## 2016-09-13 DIAGNOSIS — Z23 Encounter for immunization: Secondary | ICD-10-CM | POA: Diagnosis not present

## 2016-09-13 DIAGNOSIS — Z6824 Body mass index (BMI) 24.0-24.9, adult: Secondary | ICD-10-CM | POA: Diagnosis not present

## 2016-09-13 DIAGNOSIS — H402220 Chronic angle-closure glaucoma, left eye, stage unspecified: Secondary | ICD-10-CM | POA: Diagnosis not present

## 2016-09-13 DIAGNOSIS — H532 Diplopia: Secondary | ICD-10-CM | POA: Diagnosis not present

## 2016-09-13 DIAGNOSIS — I6302 Cerebral infarction due to thrombosis of basilar artery: Secondary | ICD-10-CM | POA: Diagnosis not present

## 2016-09-13 NOTE — ED Notes (Signed)
[facility] called to [place] [facility]. Family carried pt back to facility.

## 2016-09-15 DIAGNOSIS — E039 Hypothyroidism, unspecified: Secondary | ICD-10-CM | POA: Diagnosis not present

## 2016-09-15 DIAGNOSIS — H4901 Third [oculomotor] nerve palsy, right eye: Secondary | ICD-10-CM | POA: Diagnosis not present

## 2016-09-15 DIAGNOSIS — G309 Alzheimer's disease, unspecified: Secondary | ICD-10-CM | POA: Diagnosis not present

## 2016-09-15 DIAGNOSIS — I509 Heart failure, unspecified: Secondary | ICD-10-CM | POA: Diagnosis not present

## 2016-09-15 DIAGNOSIS — I69398 Other sequelae of cerebral infarction: Secondary | ICD-10-CM | POA: Diagnosis not present

## 2016-09-15 DIAGNOSIS — Z950 Presence of cardiac pacemaker: Secondary | ICD-10-CM | POA: Diagnosis not present

## 2016-09-15 DIAGNOSIS — Z7902 Long term (current) use of antithrombotics/antiplatelets: Secondary | ICD-10-CM | POA: Diagnosis not present

## 2016-09-15 DIAGNOSIS — I110 Hypertensive heart disease with heart failure: Secondary | ICD-10-CM | POA: Diagnosis not present

## 2016-09-15 DIAGNOSIS — K219 Gastro-esophageal reflux disease without esophagitis: Secondary | ICD-10-CM | POA: Diagnosis not present

## 2016-09-15 DIAGNOSIS — Z792 Long term (current) use of antibiotics: Secondary | ICD-10-CM | POA: Diagnosis not present

## 2016-09-16 ENCOUNTER — Emergency Department (HOSPITAL_COMMUNITY): Payer: Medicare Other

## 2016-09-16 ENCOUNTER — Encounter (HOSPITAL_COMMUNITY): Payer: Self-pay | Admitting: Emergency Medicine

## 2016-09-16 ENCOUNTER — Inpatient Hospital Stay (HOSPITAL_COMMUNITY)
Admission: EM | Admit: 2016-09-16 | Discharge: 2016-09-17 | DRG: 065 | Disposition: A | Discharge status: Skilled Nursing Facility | Payer: Medicare Other | Attending: Family Medicine | Admitting: Family Medicine

## 2016-09-16 DIAGNOSIS — I5032 Chronic diastolic (congestive) heart failure: Secondary | ICD-10-CM | POA: Diagnosis not present

## 2016-09-16 DIAGNOSIS — Z79891 Long term (current) use of opiate analgesic: Secondary | ICD-10-CM

## 2016-09-16 DIAGNOSIS — R51 Headache: Secondary | ICD-10-CM | POA: Diagnosis not present

## 2016-09-16 DIAGNOSIS — R443 Hallucinations, unspecified: Secondary | ICD-10-CM

## 2016-09-16 DIAGNOSIS — Z833 Family history of diabetes mellitus: Secondary | ICD-10-CM

## 2016-09-16 DIAGNOSIS — R404 Transient alteration of awareness: Secondary | ICD-10-CM | POA: Diagnosis not present

## 2016-09-16 DIAGNOSIS — K219 Gastro-esophageal reflux disease without esophagitis: Secondary | ICD-10-CM | POA: Diagnosis present

## 2016-09-16 DIAGNOSIS — N189 Chronic kidney disease, unspecified: Secondary | ICD-10-CM | POA: Diagnosis present

## 2016-09-16 DIAGNOSIS — Z8249 Family history of ischemic heart disease and other diseases of the circulatory system: Secondary | ICD-10-CM

## 2016-09-16 DIAGNOSIS — R531 Weakness: Secondary | ICD-10-CM

## 2016-09-16 DIAGNOSIS — Z79899 Other long term (current) drug therapy: Secondary | ICD-10-CM

## 2016-09-16 DIAGNOSIS — E785 Hyperlipidemia, unspecified: Secondary | ICD-10-CM | POA: Diagnosis present

## 2016-09-16 DIAGNOSIS — Z7902 Long term (current) use of antithrombotics/antiplatelets: Secondary | ICD-10-CM

## 2016-09-16 DIAGNOSIS — M6289 Other specified disorders of muscle: Secondary | ICD-10-CM | POA: Diagnosis not present

## 2016-09-16 DIAGNOSIS — N390 Urinary tract infection, site not specified: Secondary | ICD-10-CM | POA: Diagnosis present

## 2016-09-16 DIAGNOSIS — G8194 Hemiplegia, unspecified affecting left nondominant side: Secondary | ICD-10-CM | POA: Diagnosis not present

## 2016-09-16 DIAGNOSIS — R410 Disorientation, unspecified: Secondary | ICD-10-CM | POA: Diagnosis not present

## 2016-09-16 DIAGNOSIS — E669 Obesity, unspecified: Secondary | ICD-10-CM | POA: Diagnosis present

## 2016-09-16 DIAGNOSIS — F0390 Unspecified dementia without behavioral disturbance: Secondary | ICD-10-CM | POA: Diagnosis present

## 2016-09-16 DIAGNOSIS — I6523 Occlusion and stenosis of bilateral carotid arteries: Secondary | ICD-10-CM | POA: Diagnosis not present

## 2016-09-16 DIAGNOSIS — G9340 Encephalopathy, unspecified: Secondary | ICD-10-CM | POA: Diagnosis not present

## 2016-09-16 DIAGNOSIS — Z6824 Body mass index (BMI) 24.0-24.9, adult: Secondary | ICD-10-CM

## 2016-09-16 DIAGNOSIS — Z66 Do not resuscitate: Secondary | ICD-10-CM | POA: Diagnosis not present

## 2016-09-16 DIAGNOSIS — M6281 Muscle weakness (generalized): Secondary | ICD-10-CM

## 2016-09-16 DIAGNOSIS — H349 Unspecified retinal vascular occlusion: Secondary | ICD-10-CM | POA: Diagnosis not present

## 2016-09-16 DIAGNOSIS — I2510 Atherosclerotic heart disease of native coronary artery without angina pectoris: Secondary | ICD-10-CM | POA: Diagnosis not present

## 2016-09-16 DIAGNOSIS — I639 Cerebral infarction, unspecified: Principal | ICD-10-CM | POA: Diagnosis present

## 2016-09-16 DIAGNOSIS — H509 Unspecified strabismus: Secondary | ICD-10-CM | POA: Diagnosis not present

## 2016-09-16 DIAGNOSIS — I255 Ischemic cardiomyopathy: Secondary | ICD-10-CM | POA: Diagnosis present

## 2016-09-16 DIAGNOSIS — H5122 Internuclear ophthalmoplegia, left eye: Secondary | ICD-10-CM | POA: Diagnosis not present

## 2016-09-16 DIAGNOSIS — G459 Transient cerebral ischemic attack, unspecified: Secondary | ICD-10-CM | POA: Diagnosis not present

## 2016-09-16 DIAGNOSIS — E039 Hypothyroidism, unspecified: Secondary | ICD-10-CM | POA: Diagnosis not present

## 2016-09-16 DIAGNOSIS — Z950 Presence of cardiac pacemaker: Secondary | ICD-10-CM | POA: Diagnosis not present

## 2016-09-16 DIAGNOSIS — I130 Hypertensive heart and chronic kidney disease with heart failure and stage 1 through stage 4 chronic kidney disease, or unspecified chronic kidney disease: Secondary | ICD-10-CM | POA: Diagnosis not present

## 2016-09-16 DIAGNOSIS — H409 Unspecified glaucoma: Secondary | ICD-10-CM | POA: Diagnosis not present

## 2016-09-16 LAB — URINALYSIS, ROUTINE W REFLEX MICROSCOPIC
BILIRUBIN URINE: NEGATIVE
Glucose, UA: NEGATIVE mg/dL
HGB URINE DIPSTICK: NEGATIVE
KETONES UR: NEGATIVE mg/dL
NITRITE: NEGATIVE
PH: 7 (ref 5.0–8.0)
SPECIFIC GRAVITY, URINE: 1.01 (ref 1.005–1.030)

## 2016-09-16 LAB — CBC WITH DIFFERENTIAL/PLATELET
BASOS ABS: 0 10*3/uL (ref 0.0–0.1)
Basophils Relative: 1 %
Eosinophils Absolute: 0.2 10*3/uL (ref 0.0–0.7)
Eosinophils Relative: 3 %
HEMATOCRIT: 42.2 % (ref 36.0–46.0)
HEMOGLOBIN: 13.9 g/dL (ref 12.0–15.0)
LYMPHS ABS: 1.2 10*3/uL (ref 0.7–4.0)
LYMPHS PCT: 19 %
MCH: 30.8 pg (ref 26.0–34.0)
MCHC: 32.9 g/dL (ref 30.0–36.0)
MCV: 93.4 fL (ref 78.0–100.0)
Monocytes Absolute: 0.5 10*3/uL (ref 0.1–1.0)
Monocytes Relative: 8 %
NEUTROS ABS: 4.2 10*3/uL (ref 1.7–7.7)
NEUTROS PCT: 69 %
Platelets: 189 10*3/uL (ref 150–400)
RBC: 4.52 MIL/uL (ref 3.87–5.11)
RDW: 13.1 % (ref 11.5–15.5)
WBC: 6.1 10*3/uL (ref 4.0–10.5)

## 2016-09-16 LAB — COMPREHENSIVE METABOLIC PANEL
ALK PHOS: 63 U/L (ref 38–126)
ALT: 14 U/L (ref 14–54)
AST: 25 U/L (ref 15–41)
Albumin: 3.9 g/dL (ref 3.5–5.0)
Anion gap: 7 (ref 5–15)
BUN: 20 mg/dL (ref 6–20)
CALCIUM: 9.1 mg/dL (ref 8.9–10.3)
CHLORIDE: 103 mmol/L (ref 101–111)
CO2: 27 mmol/L (ref 22–32)
Creatinine, Ser: 1.52 mg/dL — ABNORMAL HIGH (ref 0.44–1.00)
GFR, EST AFRICAN AMERICAN: 35 mL/min — AB (ref >60)
GFR, EST NON AFRICAN AMERICAN: 31 mL/min — AB (ref >60)
Glucose, Bld: 107 mg/dL — ABNORMAL HIGH (ref 65–99)
Potassium: 4.1 mmol/L (ref 3.5–5.1)
Sodium: 137 mmol/L (ref 135–145)
Total Bilirubin: 0.5 mg/dL (ref 0.3–1.2)
Total Protein: 7.1 g/dL (ref 6.5–8.1)

## 2016-09-16 LAB — TSH: TSH: 1.182 u[IU]/mL (ref 0.350–4.500)

## 2016-09-16 LAB — URINE MICROSCOPIC-ADD ON

## 2016-09-16 LAB — CBG MONITORING, ED: GLUCOSE-CAPILLARY: 109 mg/dL — AB (ref 65–99)

## 2016-09-16 LAB — SEDIMENTATION RATE: SED RATE: 33 mm/h — AB (ref 0–22)

## 2016-09-16 LAB — MRSA PCR SCREENING: MRSA by PCR: POSITIVE — AB

## 2016-09-16 MED ORDER — DORZOLAMIDE HCL-TIMOLOL MAL 2-0.5 % OP SOLN
OPHTHALMIC | Status: AC
  Filled 2016-09-16: qty 10

## 2016-09-16 MED ORDER — ACETAMINOPHEN ER 650 MG PO TBCR: 650.0000 mg | EXTENDED_RELEASE_TABLET | Freq: Every day | ORAL | Status: DC

## 2016-09-16 MED ORDER — CEFAZOLIN IN D5W 1 GM/50ML IV SOLN
1.0000 g | Freq: Two times a day (BID) | INTRAVENOUS | Status: DC
  Administered 2016-09-16 – 2016-09-17 (×2): 1 g via INTRAVENOUS
  Filled 2016-09-16 (×8): qty 50

## 2016-09-16 MED ORDER — CEFAZOLIN IN D5W 1 GM/50ML IV SOLN
INTRAVENOUS | Status: AC
  Filled 2016-09-16: qty 50

## 2016-09-16 MED ORDER — FAMOTIDINE 20 MG PO TABS
20.0000 mg | ORAL_TABLET | Freq: Every day | ORAL | Status: DC
  Administered 2016-09-17: 20 mg via ORAL
  Filled 2016-09-16: qty 1

## 2016-09-16 MED ORDER — DONEPEZIL HCL 5 MG PO TABS
10.0000 mg | ORAL_TABLET | Freq: Every day | ORAL | Status: DC
  Administered 2016-09-16: 10 mg via ORAL
  Filled 2016-09-16: qty 2

## 2016-09-16 MED ORDER — HYDROCODONE-ACETAMINOPHEN 5-325 MG PO TABS
1.0000 | ORAL_TABLET | Freq: Once | ORAL | Status: AC
  Administered 2016-09-16: 1 via ORAL
  Filled 2016-09-16: qty 1

## 2016-09-16 MED ORDER — CLOPIDOGREL BISULFATE 75 MG PO TABS
75.0000 mg | ORAL_TABLET | Freq: Every day | ORAL | Status: DC
  Administered 2016-09-17: 75 mg via ORAL
  Filled 2016-09-16: qty 1

## 2016-09-16 MED ORDER — DORZOLAMIDE HCL-TIMOLOL MAL 2-0.5 % OP SOLN
1.0000 [drp] | Freq: Two times a day (BID) | OPHTHALMIC | Status: DC
  Administered 2016-09-17 (×2): 1 [drp] via OPHTHALMIC
  Filled 2016-09-16: qty 10

## 2016-09-16 MED ORDER — TIZANIDINE HCL 4 MG PO TABS: 2.0000 mg | ORAL_TABLET | Freq: Three times a day (TID) | ORAL | Status: DC | PRN

## 2016-09-16 MED ORDER — PANTOPRAZOLE SODIUM 20 MG PO TBEC
20.0000 mg | DELAYED_RELEASE_TABLET | Freq: Every day | ORAL | Status: DC
  Filled 2016-09-16 (×2): qty 1

## 2016-09-16 MED ORDER — FUROSEMIDE 20 MG PO TABS
20.0000 mg | ORAL_TABLET | Freq: Every day | ORAL | Status: DC
  Administered 2016-09-17: 20 mg via ORAL
  Filled 2016-09-16: qty 1

## 2016-09-16 MED ORDER — SODIUM CHLORIDE 0.9 % IV SOLN
INTRAVENOUS | Status: AC
  Administered 2016-09-16: 19:00:00 via INTRAVENOUS

## 2016-09-16 MED ORDER — ACETAMINOPHEN 325 MG PO TABS
650.0000 mg | ORAL_TABLET | Freq: Every day | ORAL | Status: DC
  Administered 2016-09-16: 650 mg via ORAL
  Filled 2016-09-16: qty 2

## 2016-09-16 MED ORDER — HYDROCODONE-ACETAMINOPHEN 5-325 MG PO TABS
1.0000 | ORAL_TABLET | Freq: Three times a day (TID) | ORAL | Status: DC | PRN
  Administered 2016-09-17: 1 via ORAL
  Filled 2016-09-16: qty 1

## 2016-09-16 MED ORDER — DULOXETINE HCL 60 MG PO CPEP
60.0000 mg | ORAL_CAPSULE | Freq: Every day | ORAL | Status: DC
  Administered 2016-09-17: 60 mg via ORAL
  Filled 2016-09-16: qty 1

## 2016-09-16 MED ORDER — LEVOTHYROXINE SODIUM 75 MCG PO TABS
75.0000 ug | ORAL_TABLET | Freq: Every day | ORAL | Status: DC
  Administered 2016-09-17: 75 ug via ORAL
  Filled 2016-09-16: qty 1

## 2016-09-16 MED ORDER — STROKE: EARLY STAGES OF RECOVERY BOOK
Freq: Once | Status: AC
  Administered 2016-09-17: 12:00:00
  Filled 2016-09-16: qty 1

## 2016-09-16 MED ORDER — DOCUSATE SODIUM 100 MG PO CAPS
200.0000 mg | ORAL_CAPSULE | Freq: Every day | ORAL | Status: DC
  Administered 2016-09-17: 200 mg via ORAL
  Filled 2016-09-16: qty 2

## 2016-09-16 MED ORDER — POTASSIUM CHLORIDE CRYS ER 20 MEQ PO TBCR
20.0000 meq | EXTENDED_RELEASE_TABLET | Freq: Every day | ORAL | Status: DC
  Administered 2016-09-16 – 2016-09-17 (×2): 20 meq via ORAL
  Filled 2016-09-16 (×2): qty 1

## 2016-09-16 MED ORDER — SENNOSIDES-DOCUSATE SODIUM 8.6-50 MG PO TABS: 1.0000 | ORAL_TABLET | Freq: Every evening | ORAL | Status: DC | PRN

## 2016-09-16 MED ORDER — ACETAMINOPHEN 500 MG PO TABS: 1000.0000 mg | ORAL_TABLET | Freq: Once | ORAL | Status: DC

## 2016-09-16 NOTE — ED Notes (Signed)
Patch over right eye due to left eye straying.

## 2016-09-16 NOTE — ED Triage Notes (Signed)
Pt is coming via [facility] from [facility]. Pt called the nurse in to her room this morning complaining of a headache. Upon assessment the nurse called EMS. Pt was dx with a stroke on Saturday. Pt is speaking in clear full sentences.  Cataract in left eye, no reaction of pupil.  Stroke affected left side.  GBC: 117

## 2016-09-16 NOTE — ED Triage Notes (Signed)
Blood sugar 109

## 2016-09-16 NOTE — Progress Notes (Signed)
Pharmacy Antibiotic Note  Cynthia [name] is a 82 y.o. female admitted on 09/16/2016 with UTI, delirium and headache.  Pharmacy has been consulted for Cefazolin dosing.  Plan: Cefazolin 1gm IV every 12 hours. Monitor labs, micro and vitals.   Height: 5\' 4"  (162.6 cm) Weight: 143 lb (64.9 kg) IBW/kg (Calculated) : 54.7  Temp (24hrs), Avg:98.1 F (36.7 C), Min:98 F (36.7 C), Max:98.2 F (36.8 C)   Recent Labs Lab 09/12/16 2128 09/16/16 1054  WBC 5.8 6.1  CREATININE 1.82* 1.52*    Estimated Creatinine Clearance: 24.2 mL/min (by C-G formula based on SCr of 1.52 mg/dL (H)).    Allergies  Allergen Reactions  . Statins    Antimicrobials this admission: Cefazolin 9/12 >>   Dose adjustments this admission: n/a   Microbiology results: 9/21 UCx: pending  9/21 MRSA PCR: pending  Thank you for allowing pharmacy to be a part of this patient's care.  Mady GemmaHayes, Ceirra Belli R 09/16/2016 6:33 PM

## 2016-09-16 NOTE — H&P (Signed)
History and Physical    LYRIQ FINERTY ZOX:[id number] DOB: 1931/12/15 DOA: 09/16/2016  PCP: Orvilla Cornwall C  Patient coming from: [facility] in [place]  Chief Complaint: Delirum and headache  HPI: Cynthia [name] is a 82 y.o. female with medical history significant of heart block and s/p pacemaker placement, renal insufficiency, hyperlipidemia, and HTN that was brought by her sons to the ED for evaluation after hearing music all night last night in her skilled nursing facility.  Patients symptoms initially began on September 17 when she states that her eye was having a hard time focusing.  She was seen by an eye doctor and was sent to the emergency department for retinal artery occlusion.  She was worked up with a CT scan of her head which was negative.  She was discharged back to the SNF with instructions to see her primary care physician to get worked up for a stroke.  However, between that time and her appointment she had an overnight episode of hearing music overnight.  Patient's sons state they noticed a change in her swallowing status today and when she told them she heard music all night they called her primary care doctor who encouraged them to bring her in for evaluation. Of note, patient did provide me with a different history than was given to the EDP by patient and her sons.  She denies chest pain, chest pressure, denies headache, sinus congestion, chest pain, chest pressure, shortness of breath, increased work of breathing, abdominal pain, dysuria, constipation or diarrhea.  Reports she does have some difficulty swallowing and difficulty with her vision- even more so since the retinal artery occlusion. ED Course: Patient was seen and evaluated by Dr. Erma Heritage.  She underwent another CT scan of her head that was negative. Laboratory data was gathered and showed renal insufficiency (Cr at baseline), and possibly an urinary tract infection.    Review of Systems: As per HPI otherwise 10 point  review of systems negative.    Past Medical History:  Diagnosis Date  . Arthritis   . Cataracts, bilateral   . Chronic diastolic heart failure (HCC)   . Complete heart block (HCC)    a. s/p Medtronic PPM  . Coronary atherosclerosis of native coronary artery    a. DES to LAD 2/05. b. DES to LAD 02/2013 for ISR.  Marland Kitchen Depression   . GERD (gastroesophageal reflux disease)   . Glaucoma   . Hyperlipidemia   . Hypothyroidism   . Ischemic cardiomyopathy    a. EF 45-50% by cath 03/01/13.  . Sarcoidosis [facility])     Past Surgical History:  Procedure Laterality Date  . ANTERIOR CERVICAL DISCECTOMY     Cervical fusion  . cataracts     bilateral  . COLONOSCOPY    . GALLBLADDER SURGERY    . LUMBAR LAMINECTOMY     L3 S1  . PACEMAKER INSERTION  2000, 2007   Medtronic  . PERCUTANEOUS CORONARY STENT INTERVENTION (PCI-S) Left 03/01/13  . PERCUTANEOUS CORONARY STENT INTERVENTION (PCI-S) N/A 03/01/2013   Procedure: PERCUTANEOUS CORONARY STENT INTERVENTION (PCI-S);  Surgeon: Kathleene Hazel, MD;  Location: [facility] CATH LAB;  Service: Cardiovascular;  Laterality: N/A;  . UPPER GASTROINTESTINAL ENDOSCOPY       reports that she has never smoked. She has never used smokeless tobacco. She reports that she does not drink alcohol or use drugs.  Allergies  Allergen Reactions  . Statins     Family History  Problem Relation Age of Onset  .  Diabetes Son   . Heart attack Mother   . Heart attack Father   . Colon cancer Neg Hx      Prior to Admission medications   Medication Sig Start Date End Date Taking? Authorizing Provider  acetaminophen (TYLENOL) 650 MG CR tablet Take 650 mg by mouth at bedtime. *May take one tablet every 6 hours as needed for pain   Yes Historical Provider, MD  atorvastatin (LIPITOR) 20 MG tablet Take 20 mg by mouth daily.   Yes Historical Provider, MD  calcium-vitamin D (OSCAL 500/200 D-3) 500-200 MG-UNIT tablet Take 1 tablet by mouth 2 (two) times daily.   Yes Historical  Provider, MD  Cholecalciferol (VITAMIN D-3) 1000 UNITS CAPS Take 1 capsule by mouth daily.   Yes Historical Provider, MD  clopidogrel (PLAVIX) 75 MG tablet TAKE ONE TABLET BY MOUTH DAILY WITH BREAKFAST 11/15/14  Yes Hillis Range, MD  docusate sodium (COLACE) 100 MG capsule Take 200 mg by mouth daily.    Yes Historical Provider, MD  donepezil (ARICEPT) 10 MG tablet Take 10 mg by mouth at bedtime.   Yes Historical Provider, MD  dorzolamide-timolol (COSOPT) 22.3-6.8 MG/ML ophthalmic solution Place 1 drop into the left eye 2 (two) times daily.  12/29/12  Yes Historical Provider, MD  DULoxetine (CYMBALTA) 60 MG capsule Take 60 mg by mouth daily.   Yes Historical Provider, MD  furosemide (LASIX) 20 MG tablet Take 20 mg by mouth daily.   Yes Historical Provider, MD  HYDROcodone-acetaminophen (NORCO/VICODIN) 5-325 MG tablet Take 1 tablet by mouth every 8 (eight) hours as needed for moderate pain.   Yes Historical Provider, MD  levothyroxine (SYNTHROID, LEVOTHROID) 75 MCG tablet Take 75 mcg by mouth daily before breakfast.   Yes Historical Provider, MD  Multiple Vitamin (MULTIVITAMIN WITH MINERALS) TABS tablet Take 1 tablet by mouth daily.   Yes Historical Provider, MD  pantoprazole (PROTONIX) 20 MG tablet Take 20 mg by mouth daily.   Yes Historical Provider, MD  potassium chloride SA (K-DUR,KLOR-CON) 20 MEQ tablet Take 20 mEq by mouth daily.   Yes Historical Provider, MD  ranitidine (ZANTAC) 150 MG tablet Take 150 mg by mouth at bedtime.    Yes Historical Provider, MD  tiZANidine (ZANAFLEX) 2 MG tablet Take 2 mg by mouth every 8 (eight) hours as needed for muscle spasms.   Yes Historical Provider, MD    Physical Exam: Vitals:   09/16/16 1400 09/16/16 1430 09/16/16 1452 09/16/16 1530  BP: 128/65 144/67  (!) 148/53  Pulse: (!) 59 61  65  Resp: 11 15  18   Temp:   98.2 F (36.8 C) 98 F (36.7 C)  TempSrc:    Oral  SpO2: 97% 97%  96%  Weight:      Height:    5\' 4"  (1.626 m)      Constitutional:  NAD, calm, comfortable Vitals:   09/16/16 1400 09/16/16 1430 09/16/16 1452 09/16/16 1530  BP: 128/65 144/67  (!) 148/53  Pulse: (!) 59 61  65  Resp: 11 15  18   Temp:   98.2 F (36.8 C) 98 F (36.7 C)  TempSrc:    Oral  SpO2: 97% 97%  96%  Weight:      Height:    5\' 4"  (1.626 m)   Eyes: lids and conjunctivae normal, eye patch over right eye, decreased pupillary response in right eye ENMT: Mucous membranes are moist. Posterior pharynx clear of any exudate or lesions.Normal dentition.  Neck: normal, supple, no masses, no thyromegaly  Respiratory: clear to auscultation bilaterally, no wheezing, no crackles. Normal respiratory effort. No accessory muscle use.  Cardiovascular: irregular rate and rhythm, II/VI systolic murmur heard best at pulmonic area, no rubs / gallops. No extremity edema. 2+ pedal pulses.   Abdomen: no tenderness, no masses palpated. No hepatosplenomegaly. Bowel sounds positive.  Musculoskeletal: no clubbing / cyanosis. No joint deformity upper and lower extremities. Good ROM, no contractures. 5/5 strength in upper and lower extremities bilaterally.  Skin: no rashes, lesions, ulcers. No induration Neurologic: CN 2-12 grossly intact. Sensation intact, DTR normal. Strength 5/5 in all 4.  Psychiatric: Normal judgment and insight. Alert and oriented x 3- however, unable to provide specifics of days, knows she is at the hospital, knows why, knows name, knows date. Normal mood.    Labs on Admission: I have personally reviewed following labs and imaging studies  CBC:  Recent Labs Lab 09/12/16 2128 09/16/16 1054  WBC 5.8 6.1  NEUTROABS 4.1 4.2  HGB 12.4 13.9  HCT 38.6 42.2  MCV 93.0 93.4  PLT 214 189   Basic Metabolic Panel:  Recent Labs Lab 09/12/16 2128 09/16/16 1054  NA 139 137  K 4.0 4.1  CL 107 103  CO2 26 27  GLUCOSE 128* 107*  BUN 25* 20  CREATININE 1.82* 1.52*  CALCIUM 9.1 9.1   GFR: Estimated Creatinine Clearance: 24.2 mL/min (by C-G formula  based on SCr of 1.52 mg/dL (H)). Liver Function Tests:  Recent Labs Lab 09/16/16 1054  AST 25  ALT 14  ALKPHOS 63  BILITOT 0.5  PROT 7.1  ALBUMIN 3.9   No results for input(s): LIPASE, AMYLASE in the last 168 hours. No results for input(s): AMMONIA in the last 168 hours. Coagulation Profile: No results for input(s): INR, PROTIME in the last 168 hours. Cardiac Enzymes: No results for input(s): CKTOTAL, CKMB, CKMBINDEX, TROPONINI in the last 168 hours. BNP (last 3 results) No results for input(s): PROBNP in the last 8760 hours. HbA1C: No results for input(s): HGBA1C in the last 72 hours. CBG:  Recent Labs Lab 09/16/16 0939  GLUCAP 109*   Lipid Profile: No results for input(s): CHOL, HDL, LDLCALC, TRIG, CHOLHDL, LDLDIRECT in the last 72 hours. Thyroid Function Tests:  Recent Labs  09/16/16 1054  TSH 1.182   Anemia Panel: No results for input(s): VITAMINB12, FOLATE, FERRITIN, TIBC, IRON, RETICCTPCT in the last 72 hours. Urine analysis:    Component Value Date/Time   COLORURINE YELLOW 09/16/2016 1013   APPEARANCEUR CLEAR 09/16/2016 1013   LABSPEC 1.010 09/16/2016 1013   PHURINE 7.0 09/16/2016 1013   GLUCOSEU NEGATIVE 09/16/2016 1013   HGBUR NEGATIVE 09/16/2016 1013   BILIRUBINUR NEGATIVE 09/16/2016 1013   KETONESUR NEGATIVE 09/16/2016 1013   PROTEINUR TRACE (A) 09/16/2016 1013   UROBILINOGEN 1.0 12/03/2010 1528   NITRITE NEGATIVE 09/16/2016 1013   LEUKOCYTESUR SMALL (A) 09/16/2016 1013   Sepsis Labs: !!!!!!!!!!!!!!!!!!!!!!!!!!!!!!!!!!!!!!!!!!!! @LABRCNTIP (procalcitonin:4,lacticidven:4) )No results found for this or any previous visit (from the past 240 hour(s)).   Radiological Exams on Admission: Dg Chest 2 View  Result Date: 09/16/2016 CLINICAL DATA:  82 year old female with headache. Initial encounter. EXAM: CHEST  2 VIEW COMPARISON:  [facility] portable chest radiographs 04/26/2015 and earlier. FINDINGS: Stable left chest dual lead cardiac  pacemaker. Normal cardiac size and mediastinal contours. Calcified aortic atherosclerosis. Chronic lower thoracic compression fracture with prior augmentation. Chronic cervical ACDF hardware. Stable cholecystectomy clips. Stable lung volumes with mild elevation of the right hemidiaphragm. Occasional patchy probable parenchymal scarring in the right  lung appears stable since 2016. No pneumothorax, pulmonary edema, pleural effusion or acute pulmonary opacity. IMPRESSION: No acute cardiopulmonary abnormality. Calcified aortic atherosclerosis. Electronically Signed   By: Odessa FlemingH  Hall M.D.   On: 09/16/2016 11:10   Ct Head Wo Contrast  Result Date: 09/16/2016 CLINICAL DATA:  Altered mental status, history coronary artery disease, chronic diastolic CHF, sarcoidosis, GERD, ischemic cardiomyopathy, benign hypertension EXAM: CT HEAD WITHOUT CONTRAST TECHNIQUE: Contiguous axial images were obtained from the base of the skull through the vertex without intravenous contrast. COMPARISON:  09/12/2016 FINDINGS: Brain: Generalized atrophy. Normal ventricular morphology. No midline shift or mass effect. Small vessel chronic ischemic changes of deep cerebral white matter. No intracranial hemorrhage, mass lesion, evidence of acute infarction, or extra-axial fluid collection. Vascular: Unremarkable Skull: Demineralized but intact Sinuses/Orbits: Clear Other: N/A IMPRESSION: Atrophy with small vessel chronic ischemic changes of deep cerebral white matter. No acute intracranial abnormalities. Electronically Signed   By: Ulyses SouthwardMark  Boles M.D.   On: 09/16/2016 11:27    EKG: Independently reviewed- paced. No change from previous EKG on 9/18  Assessment/Plan Active Problems:   Delirium   Retinal artery occlusion   Strabismus   Left-sided weakness   TIA (transient ischemic attack)     TIA vs CVA - unable to perform MRI 2/2 pacemaker - will not order CTA 2/2 patient's Cr and kidney function - Dr. Gerilyn Pilgrimoonquah of Neurology consulted-  appreciate his recommendations - U/S of carotids ordered as well as Echocardiogram - Patient already on Plavix - f/u Neurology recommendations  Dementia - continue Aricept at home dose  GERD - continue Pepcid and Protonix  Hypothyroidism - Continue levothyroxine at home dose  CKD - will redraw BMP in am - continue IVF - Cr appears to be at baseline (~1.5)  UTI - asymptomatic - will treat with Keflex empirically until culture results come back - urine culture results pending   DVT prophylaxis: on full dose anticoagulation Code Status: DNR/ DNI Family Communication: patient's sons discussed plan of care with ED physician [facility]- expect discharge in more than 24 hours Consults called: Dr. Gerilyn Pilgrimoonquah, Neurology, called by EDP Admission status: Observation to telemetry   Bennett ScrapeAlex Ukleja MD [facility] Pager [phone]  If 7PM-7AM, please contact night-coverage [email or web address] Password [email or web address]  09/16/2016, 5:42 PM

## 2016-09-16 NOTE — ED Triage Notes (Signed)
Pt states that she is having trouble swallowing.

## 2016-09-16 NOTE — Consult Note (Signed)
Cynthia A. Merlene Laughter, MD     [email or web address]          Cynthia [name] is an 82 y.o. female.   ASSESSMENT/PLAN: 1. 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 This is most likely due to deep perforating small blood vessel.  Risk factors coronary artery disease, dyslipidemia and ischemic cardiomyopathy. The patient has prominent headaches with her symptoms this raises the potential for temporal arteritis.   Recommendations: Additional labs for the following angiotensin-converting enzyme given the history of sarcoidosis, C-reactive protein, ESR, homocystine and vitamin B12 levels. Aspirin 325 daily. Follow the patient clinically. Will be good to additional imaging such as MRA/MRI but the patient has a pacemaker. Additionally, CTA would be good but she has some chronic renal impairment.    The patient 82 year old white female who presented to the hospital a few days ago with severe headache involving the occiput associated with visual obscuration/loss of vision other symptoms involving the left eye. Patient reports that the left eye was apparently exotropic which she typically does not have strabismus. She reports not seen well to the left eye. She apparently was diagnosed per history by the emergency room doctor as having central artery stenosis. The patient reports that she did have dizziness described as gait instability/disequilibrium with her symptoms. Headaches and to be a very prominent complaint of the patient. She was told that she did have some dysarthria although she really did not appreciate this. The patient was taken to the hospital for another evaluation today because she developed severe headache again and the occiput. She indicated that her left eye symptoms have actually improved. The patient reports having some chronic chest pain and shortness of  breath which is not usual for the patient. She does not report having numbness or weakness of the upper lower extremities. She denies GI GU symptoms. She tells me she will was to be on aspirin when this was discontinued at the facility where she is staying. I believe this is [name]. The review systems otherwise negative.    GENERAL: Pleasant obese female in no acute distress.  HEENT: There is a marked soreness on palpation involving the temporal region bilaterally.  ABDOMEN: soft  EXTREMITIES: No edema   BACK: Normal  SKIN: Normal by inspection.    MENTAL STATUS: Alert and oriented - including orientation to age and month. Speech, language and cognition are generally intact. Judgment and insight normal.   CRANIAL NERVES: There is marked exotropia of the left eye with the pupil spherical and irregular in shape and sluggishly reactive; the right pupil is 4 mm and briskly reactive. The right eye does not cross the midline but otherwise has full ductions vertically and horizontally; the left eye has full range of motion but is associated with significant left beating nystagmus with movement to the left; visual fields are full;  upper and lower facial muscles are normal in strength and symmetric, there is no flattening of the nasolabial folds; tongue is midline; uvula is midline; shoulder elevation is normal.  MOTOR: Bulk and tone are normal throughout. The patient has global weakness 4+/5 in upper and lower extremities bilaterally. There is a mild downward drift of the left upper extremity and left leg. No drift on the right side.  COORDINATION: Left finger to nose is normal, right finger to nose is normal, No rest tremor; no intention tremor; no postural tremor; no  bradykinesia.  REFLEXES: Deep tendon reflexes are symmetrical and normal. Babinski reflexes are flexor bilaterally.   SENSATION: Normal to light touch and temperature.   NIH stroke scale 3.   Blood  pressure (!) 148/53, pulse 65, temperature 98 F (36.7 C), temperature source Oral, resp. rate 18, height 5\' 4"  (1.626 m), weight 143 lb (64.9 kg), SpO2 96 %.  Past Medical History:  Diagnosis Date  . Arthritis   . Cataracts, bilateral   . Chronic diastolic heart failure (HCC)   . Complete heart block (HCC)    a. s/p Medtronic PPM  . Coronary atherosclerosis of native coronary artery    a. DES to LAD 2/05. b. DES to LAD 02/2013 for ISR.  03/2013 Depression   . GERD (gastroesophageal reflux disease)   . Glaucoma   . Hyperlipidemia   . Hypothyroidism   . Ischemic cardiomyopathy    a. EF 45-50% by cath 03/01/13.  . Sarcoidosis [facility])     Past Surgical History:  Procedure Laterality Date  . ANTERIOR CERVICAL DISCECTOMY     Cervical fusion  . cataracts     bilateral  . COLONOSCOPY    . GALLBLADDER SURGERY    . LUMBAR LAMINECTOMY     L3 S1  . PACEMAKER INSERTION  2000, 2007   Medtronic  . PERCUTANEOUS CORONARY STENT INTERVENTION (PCI-S) Left 03/01/13  . PERCUTANEOUS CORONARY STENT INTERVENTION (PCI-S) N/A 03/01/2013   Procedure: PERCUTANEOUS CORONARY STENT INTERVENTION (PCI-S);  Surgeon: 05/01/2013, MD;  [facility]: [facility] C[facility];  Service: Cardiovascular;  Laterality: N/A;  . UPPER GASTROINTESTINAL ENDOSCOPY      Family History  Problem Relation Age of Onset  . Diabetes Son   . Heart attack Mother   . Heart attack Father   . Colon cancer Neg Hx     Social History:  reports that she has never smoked. She has never used smokeless tobacco. She reports that she does not drink alcohol or use drugs.  Allergies:  Allergies  Allergen Reactions  . Statins     Medications: Prior to Admission medications   Medication Sig Start Date End Date Taking? Authorizing Provider  acetaminophen (TYLENOL) 650 MG CR tablet Take 650 mg by mouth at bedtime. *May take one tablet every 6 hours as needed for pain   Yes Historical Provider, MD  atorvastatin (LIPITOR) 20 MG tablet Take 20 mg by  mouth daily.   Yes Historical Provider, MD  calcium-vitamin D (OSCAL 500/200 D-3) 500-200 MG-UNIT tablet Take 1 tablet by mouth 2 (two) times daily.   Yes Historical Provider, MD  Cholecalciferol (VITAMIN D-3) 1000 UNITS CAPS Take 1 capsule by mouth daily.   Yes Historical Provider, MD  clopidogrel (PLAVIX) 75 MG tablet TAKE ONE TABLET BY MOUTH DAILY WITH BREAKFAST 11/15/14  Yes 11/17/14, MD  docusate sodium (COLACE) 100 MG capsule Take 200 mg by mouth daily.    Yes Historical Provider, MD  donepezil (ARICEPT) 10 MG tablet Take 10 mg by mouth at bedtime.   Yes Historical Provider, MD  dorzolamide-timolol (COSOPT) 22.3-6.8 MG/ML ophthalmic solution Place 1 drop into the left eye 2 (two) times daily.  12/29/12  Yes Historical Provider, MD  DULoxetine (CYMBALTA) 60 MG capsule Take 60 mg by mouth daily.   Yes Historical Provider, MD  furosemide (LASIX) 20 MG tablet Take 20 mg by mouth daily.   Yes Historical Provider, MD  HYDROcodone-acetaminophen (NORCO/VICODIN) 5-325 MG tablet Take 1 tablet by mouth every 8 (eight) hours as needed for moderate  pain.   Yes Historical Provider, MD  levothyroxine (SYNTHROID, LEVOTHROID) 75 MCG tablet Take 75 mcg by mouth daily before breakfast.   Yes Historical Provider, MD  Multiple Vitamin (MULTIVITAMIN WITH MINERALS) TABS tablet Take 1 tablet by mouth daily.   Yes Historical Provider, MD  pantoprazole (PROTONIX) 20 MG tablet Take 20 mg by mouth daily.   Yes Historical Provider, MD  potassium chloride SA (K-DUR,KLOR-CON) 20 MEQ tablet Take 20 mEq by mouth daily.   Yes Historical Provider, MD  ranitidine (ZANTAC) 150 MG tablet Take 150 mg by mouth at bedtime.    Yes Historical Provider, MD  tiZANidine (ZANAFLEX) 2 MG tablet Take 2 mg by mouth every 8 (eight) hours as needed for muscle spasms.   Yes Historical Provider, MD    Scheduled Meds: .  stroke: mapping our early stages of recovery book   Does not apply Once   Continuous Infusions: . sodium chloride      PRN Meds:.senna-docusate     Results for orders placed or performed during the hospital encounter of 09/16/16 (from the past 48 hour(s))  CBG monitoring, ED     Status: Abnormal   Collection Time: 09/16/16  9:39 AM  Result Value Ref Range   Glucose-Capillary 109 (H) 65 - 99 mg/dL  Urinalysis, Routine w reflex microscopic (not at [facility])     Status: Abnormal   Collection Time: 09/16/16 10:13 AM  Result Value Ref Range   Color, Urine YELLOW YELLOW   APPearance CLEAR CLEAR   Specific Gravity, Urine 1.010 1.005 - 1.030   pH 7.0 5.0 - 8.0   Glucose, UA NEGATIVE NEGATIVE mg/dL   Hgb urine dipstick NEGATIVE NEGATIVE   Bilirubin Urine NEGATIVE NEGATIVE   Ketones, ur NEGATIVE NEGATIVE mg/dL   Protein, ur TRACE (A) NEGATIVE mg/dL   Nitrite NEGATIVE NEGATIVE   Leukocytes, UA SMALL (A) NEGATIVE  Urine microscopic-add on     Status: Abnormal   Collection Time: 09/16/16 10:13 AM  Result Value Ref Range   Squamous Epithelial / LPF 0-5 (A) NONE SEEN   WBC, UA 0-5 0 - 5 WBC/hpf   RBC / HPF 0-5 0 - 5 RBC/hpf   Bacteria, UA FEW (A) NONE SEEN  CBC with Differential     Status: None   Collection Time: 09/16/16 10:54 AM  Result Value Ref Range   WBC 6.1 4.0 - 10.5 K/uL   RBC 4.52 3.87 - 5.11 MIL/uL   Hemoglobin 13.9 12.0 - 15.0 g/dL   HCT 42.2 36.0 - 46.0 %   MCV 93.4 78.0 - 100.0 fL   MCH 30.8 26.0 - 34.0 pg   MCHC 32.9 30.0 - 36.0 g/dL   RDW 13.1 11.5 - 15.5 %   Platelets 189 150 - 400 K/uL   Neutrophils Relative % 69 %   Neutro Abs 4.2 1.7 - 7.7 K/uL   Lymphocytes Relative 19 %   Lymphs Abs 1.2 0.7 - 4.0 K/uL   Monocytes Relative 8 %   Monocytes Absolute 0.5 0.1 - 1.0 K/uL   Eosinophils Relative 3 %   Eosinophils Absolute 0.2 0.0 - 0.7 K/uL   Basophils Relative 1 %   Basophils Absolute 0.0 0.0 - 0.1 K/uL  Comprehensive metabolic panel     Status: Abnormal   Collection Time: 09/16/16 10:54 AM  Result Value Ref Range   Sodium 137 135 - 145 mmol/L   Potassium 4.1 3.5 - 5.1 mmol/L    Chloride 103 101 - 111 mmol/L   CO2  27 22 - 32 mmol/L   Glucose, Bld 107 (H) 65 - 99 mg/dL   BUN 20 6 - 20 mg/dL   Creatinine, Ser 1.52 (H) 0.44 - 1.00 mg/dL   Calcium 9.1 8.9 - 10.3 mg/dL   Total Protein 7.1 6.5 - 8.1 g/dL   Albumin 3.9 3.5 - 5.0 g/dL   AST 25 15 - 41 U/L   ALT 14 14 - 54 U/L   Alkaline Phosphatase 63 38 - 126 U/L   Total Bilirubin 0.5 0.3 - 1.2 mg/dL   GFR calc non Af Amer 31 (L) >60 mL/min   GFR calc Af Amer 35 (L) >60 mL/min    Comment: (NOTE) The eGFR has been calculated using the CKD EPI equation. This calculation has not been validated in all clinical situations. eGFR's persistently <60 mL/min signify possible Chronic Kidney Disease.    Anion gap 7 5 - 15  TSH     Status: None   Collection Time: 09/16/16 10:54 AM  Result Value Ref Range   TSH 1.182 0.350 - 4.500 uIU/mL    Studies/Results:  HEAD CT 09-12-16 CLINICAL DATA:  Acute onset of decreased vision, worse on the left. Headache. Initial encounter.  EXAM: CT HEAD WITHOUT CONTRAST  TECHNIQUE: Contiguous axial images were obtained from the base of the skull through the vertex without intravenous contrast.  COMPARISON:  CT of the head performed 06/17/2007  FINDINGS: Brain: No evidence of acute infarction, hemorrhage, hydrocephalus, extra-axial collection or mass lesion/mass effect.  Prominence of the ventricles and sulci reflects mild to moderate cortical volume loss. Mild cerebellar atrophy is noted. Mild periventricular and subcortical white matter change likely reflects small vessel ischemic microangiopathy.  The brainstem and fourth ventricle are within normal limits. The basal ganglia are unremarkable in appearance. The cerebral hemispheres demonstrate grossly normal gray-white differentiation. No mass effect or midline shift is seen.  Vascular: No hyperdense vessel or unexpected calcification.  Skull: There is no evidence of fracture; visualized osseous structures are  unremarkable in appearance.  Sinuses/Orbits: The visualized portions of the orbits are within normal limits. The paranasal sinuses and mastoid air cells are well-aerated.  Other: No significant soft tissue abnormalities are seen.  IMPRESSION: 1. No acute intracranial pathology seen on CT. 2. Mild to moderate cortical volume loss and scattered small vessel ischemic microangiopathy.      HEAD CT TODAY FINDINGS: Brain: Generalized atrophy. Normal ventricular morphology. No midline shift or mass effect. Small vessel chronic ischemic changes of deep cerebral white matter. No intracranial hemorrhage, mass lesion, evidence of acute infarction, or extra-axial fluid collection.  Vascular: Unremarkable  Skull: Demineralized but intact  Sinuses/Orbits: Clear  Other: N/A  IMPRESSION: Atrophy with small vessel chronic ischemic changes of deep cerebral white matter.  No acute intracranial abnormalities.            Derris Millan A. Merlene [name], M.D.  Diplomate, Tax adviser of Psychiatry and Neurology ( Neurology). 09/16/2016, 4:54 PM

## 2016-09-16 NOTE — ED Provider Notes (Signed)
[facility]-EMERGENCY DEPT Provider Note   CSN: [id number] Arrival date & time: 09/16/16  [id number]  By signing my name below, I, Sandrea HammondStephen Dignan, attest that this documentation has been prepared under the direction and in the presence of Shaune Pollackameron Hannalee Castor, MD. Electronically Signed: Sandrea HammondStephen Dignan, ED Scribe. 09/16/16. 12:49 PM.  History   Chief Complaint Chief Complaint  Patient presents with  . genrealized weakness   HPI Comments: Cynthia [name] is a 82 y.o. female who presents to the Emergency Department complaining of auditory and visual hallucinations that occurred last night. Pt states she believes she heard music and saw strange movements on the floor. She reports no previous episodes for hallucinations. Pt wears an eyepatch over her right eye and was recently prescribed a new statin medication. Pt takes tablets for pain following a recent back surgery. Pt says she having difficulty using her walker this morning and felt weaker than usual. She also reports difficulty swallowing that began a few days ago, states she is able to swallow but then it gets caught in her esophagus and has to spit it back up. Pt also mentions a headache this morning which has since resolved. She denies dysuria.   PCP Garner Nashaniels  The history is provided by the patient. No language interpreter was used.    Past Medical History:  Diagnosis Date  . Arthritis   . Cataracts, bilateral   . Chronic diastolic heart failure (HCC)   . Complete heart block (HCC)    a. s/p Medtronic PPM  . Coronary atherosclerosis of native coronary artery    a. DES to LAD 2/05. b. DES to LAD 02/2013 for ISR.  Marland Kitchen. Depression   . GERD (gastroesophageal reflux disease)   . Glaucoma   . Hyperlipidemia   . Hypothyroidism   . Ischemic cardiomyopathy    a. EF 45-50% by cath 03/01/13.  . Sarcoidosis [facility](HCC)     Patient Active Problem List   Diagnosis Date Noted  . Delirium 09/16/2016  . Retinal artery occlusion 09/16/2016  . Strabismus 09/16/2016  .  Left-sided weakness 09/16/2016  . TIA (transient ischemic attack) 09/16/2016  . Status post placement of cardiac pacemaker 06/20/2013  . Valvular heart disease 03/16/2013  . Renal insufficiency 03/16/2013  . Cardiomyopathy, ischemic 03/02/2013  . SARCOIDOSIS 10/10/2008  . HYPERLIPIDEMIA-MIXED 10/10/2008  . HYPERTENSION, BENIGN 10/10/2008  . CAD, NATIVE VESSEL 10/10/2008  . AV BLOCK, COMPLETE 10/10/2008    Past Surgical History:  Procedure Laterality Date  . ANTERIOR CERVICAL DISCECTOMY     Cervical fusion  . cataracts     bilateral  . COLONOSCOPY    . GALLBLADDER SURGERY    . LUMBAR LAMINECTOMY     L3 S1  . PACEMAKER INSERTION  2000, 2007   Medtronic  . PERCUTANEOUS CORONARY STENT INTERVENTION (PCI-S) Left 03/01/13  . PERCUTANEOUS CORONARY STENT INTERVENTION (PCI-S) N/A 03/01/2013   Procedure: PERCUTANEOUS CORONARY STENT INTERVENTION (PCI-S);  Surgeon: Kathleene Hazelhristopher D McAlhany, MD;  [facility];  Service: Cardiovascular;  Laterality: N/A;  . UPPER GASTROINTESTINAL ENDOSCOPY      OB History    No data available       Home Medications    Prior to Admission medications   Medication Sig Start Date End Date Taking? Authorizing Provider  acetaminophen (TYLENOL) 650 MG CR tablet Take 650 mg by mouth at bedtime. *May take one tablet every 6 hours as needed for pain   Yes Historical Provider, MD  atorvastatin (LIPITOR) 20 MG tablet Take 20 mg by  mouth daily.   Yes Historical Provider, MD  calcium-vitamin D (OSCAL 500/200 D-3) 500-200 MG-UNIT tablet Take 1 tablet by mouth 2 (two) times daily.   Yes Historical Provider, MD  Cholecalciferol (VITAMIN D-3) 1000 UNITS CAPS Take 1 capsule by mouth daily.   Yes Historical Provider, MD  clopidogrel (PLAVIX) 75 MG tablet TAKE ONE TABLET BY MOUTH DAILY WITH BREAKFAST 11/15/14  Yes Hillis Range, MD  docusate sodium (COLACE) 100 MG capsule Take 200 mg by mouth daily.    Yes Historical Provider, MD  donepezil (ARICEPT) 10 MG tablet Take  10 mg by mouth at bedtime.   Yes Historical Provider, MD  dorzolamide-timolol (COSOPT) 22.3-6.8 MG/ML ophthalmic solution Place 1 drop into the left eye 2 (two) times daily.  12/29/12  Yes Historical Provider, MD  DULoxetine (CYMBALTA) 60 MG capsule Take 60 mg by mouth daily.   Yes Historical Provider, MD  furosemide (LASIX) 20 MG tablet Take 20 mg by mouth daily.   Yes Historical Provider, MD  HYDROcodone-acetaminophen (NORCO/VICODIN) 5-325 MG tablet Take 1 tablet by mouth every 8 (eight) hours as needed for moderate pain.   Yes Historical Provider, MD  levothyroxine (SYNTHROID, LEVOTHROID) 75 MCG tablet Take 75 mcg by mouth daily before breakfast.   Yes Historical Provider, MD  Multiple Vitamin (MULTIVITAMIN WITH MINERALS) TABS tablet Take 1 tablet by mouth daily.   Yes Historical Provider, MD  pantoprazole (PROTONIX) 20 MG tablet Take 20 mg by mouth daily.   Yes Historical Provider, MD  potassium chloride SA (K-DUR,KLOR-CON) 20 MEQ tablet Take 20 mEq by mouth daily.   Yes Historical Provider, MD  ranitidine (ZANTAC) 150 MG tablet Take 150 mg by mouth at bedtime.    Yes Historical Provider, MD  tiZANidine (ZANAFLEX) 2 MG tablet Take 2 mg by mouth every 8 (eight) hours as needed for muscle spasms.   Yes Historical Provider, MD    Family History Family History  Problem Relation Age of Onset  . Diabetes Son   . Heart attack Mother   . Heart attack Father   . Colon cancer Neg Hx     Social History Social History  Substance Use Topics  . Smoking status: Never Smoker  . Smokeless tobacco: Never Used  . Alcohol use No     Allergies   Statins   Review of Systems Review of Systems  Constitutional: Positive for fatigue. Negative for chills and fever.  HENT: Positive for trouble swallowing. Negative for congestion, rhinorrhea and sore throat.   Eyes: Negative for visual disturbance.  Respiratory: Positive for cough. Negative for shortness of breath and wheezing.   Cardiovascular:  Negative for chest pain and leg swelling.  Gastrointestinal: Positive for abdominal pain. Negative for diarrhea, nausea and vomiting.  Genitourinary: Negative for dysuria, flank pain, vaginal bleeding and vaginal discharge.  Musculoskeletal: Positive for back pain. Negative for neck pain.  Skin: Negative for rash.  Allergic/Immunologic: Negative for immunocompromised state.  Neurological: Positive for weakness and headaches. Negative for syncope.  Hematological: Does not bruise/bleed easily.  Psychiatric/Behavioral: Positive for hallucinations.  All other systems reviewed and are negative.    Physical Exam Updated Vital Signs BP 129/62 (BP Location: Right Arm)   Pulse 73   Temp 98.7 F (37.1 C) (Oral)   Resp 18   Ht 5\' 4"  (1.626 m)   Wt 143 lb (64.9 kg)   SpO2 94%   BMI 24.55 kg/m   Physical Exam  Constitutional: She is oriented to person, place, and time. She appears  well-developed and well-nourished. No distress.  HENT:  Head: Normocephalic and atraumatic.  Eyes: Conjunctivae are normal.  Neck: Neck supple.  Cardiovascular: Normal rate, regular rhythm and normal heart sounds.  Exam reveals no friction rub.   No murmur heard. Pulmonary/Chest: Effort normal and breath sounds normal. No respiratory distress. She has no wheezes. She has no rales.  Abdominal: She exhibits no distension.  Musculoskeletal: She exhibits no edema.  Neurological: She is alert and oriented to person, place, and time. She exhibits normal muscle tone.  Skin: Skin is warm. Capillary refill takes less than 2 seconds.  Psychiatric: She has a normal mood and affect.  Nursing note and vitals reviewed.  Neurological Exam:  Mental Status: Alert and oriented to person, place, and time. Attention and concentration normal. Speech clear. Recent memory is intact. Cranial Nerves: Decreased pupillary response in right eye, with patch in place. EOMI. No nystagmus noted. Facial sensation intact at forehead,  maxillary cheek, and chin/mandible bilaterally. No weakness of masticatory muscles. No facial asymmetry or weakness. Hearing grossly normal to finer rub. Uvula is midline, and palate elevates symmetrically. Normal SCM and trapezius strength. Tongue midline without fasciculations Motor: Muscle strength 5/5 in proximal and distal UE and LE bilaterally. No pronator drift. Muscle tone normal. Reflexes: 2+ and symmetrical in all four extremities.  Sensation: Intact to light touch in upper and lower extremities distally bilaterally.  Gait: Normal without ataxia. Coordination: Normal FTN bilaterally.  ED Treatments / Results   DIAGNOSTIC STUDIES: Oxygen Saturation is 95% on RA, adequate by my interpretation.    COORDINATION OF CARE: 10:06 AM Discussed treatment plan with pt at bedside and pt agreed to plan.   Labs (all labs ordered are listed, but only abnormal results are displayed) Labs Reviewed  MRSA PCR SCREENING - Abnormal; Notable for the following:       Result Value   MRSA by PCR POSITIVE (*)    All other components within normal limits  COMPREHENSIVE METABOLIC PANEL - Abnormal; Notable for the following:    Glucose, Bld 107 (*)    Creatinine, Ser 1.52 (*)    GFR calc non Af Amer 31 (*)    GFR calc Af Amer 35 (*)    All other components within normal limits  URINALYSIS, ROUTINE W REFLEX MICROSCOPIC (NOT AT [facility]) - Abnormal; Notable for the following:    Protein, ur TRACE (*)    Leukocytes, UA SMALL (*)    All other components within normal limits  URINE MICROSCOPIC-ADD ON - Abnormal; Notable for the following:    Squamous Epithelial / LPF 0-5 (*)    Bacteria, UA FEW (*)    All other components within normal limits  SEDIMENTATION RATE - Abnormal; Notable for the following:    Sed Rate 33 (*)    All other components within normal limits  CBG MONITORING, ED - Abnormal; Notable for the following:    Glucose-Capillary 109 (*)    All other components within normal limits  URINE  CULTURE  CBC WITH DIFFERENTIAL/PLATELET  TSH  VITAMIN B12  RPR  HOMOCYSTEINE  C-REACTIVE PROTEIN  HEMOGLOBIN A1C  LIPID PANEL  BASIC METABOLIC PANEL  ANGIOTENSIN CONVERTING ENZYME    EKG  EKG Interpretation  Date/Time:  Thursday September 16 2016 09:35:41 EDT Ventricular Rate:  67 PR Interval:    QRS Duration: 155 QT Interval:  448 QTC Calculation: 473 R Axis:   -75 Text Interpretation:  Atrial-sensed ventricular-paced rhythm No further analysis attempted due to paced rhythm No  significant change since last tracing Confirmed by [facility] MD, Sheria Lang [phone]) on 09/16/2016 10:18:35 AM       Radiology Dg Chest 2 View  Result Date: 09/16/2016 CLINICAL DATA:  82 year old female with headache. Initial encounter. EXAM: CHEST  2 VIEW COMPARISON:  [facility] portable chest radiographs 04/26/2015 and earlier. FINDINGS: Stable left chest dual lead cardiac pacemaker. Normal cardiac size and mediastinal contours. Calcified aortic atherosclerosis. Chronic lower thoracic compression fracture with prior augmentation. Chronic cervical ACDF hardware. Stable cholecystectomy clips. Stable lung volumes with mild elevation of the right hemidiaphragm. Occasional patchy probable parenchymal scarring in the right lung appears stable since 2016. No pneumothorax, pulmonary edema, pleural effusion or acute pulmonary opacity. IMPRESSION: No acute cardiopulmonary abnormality. Calcified aortic atherosclerosis. Electronically Signed   By: Odessa Fleming M.D.   On: 09/16/2016 11:10   Ct Head Wo Contrast  Result Date: 09/16/2016 CLINICAL DATA:  Altered mental status, history coronary artery disease, chronic diastolic CHF, sarcoidosis, GERD, ischemic cardiomyopathy, benign hypertension EXAM: CT HEAD WITHOUT CONTRAST TECHNIQUE: Contiguous axial images were obtained from the base of the skull through the vertex without intravenous contrast. COMPARISON:  09/12/2016 FINDINGS: Brain: Generalized atrophy. Normal  ventricular morphology. No midline shift or mass effect. Small vessel chronic ischemic changes of deep cerebral white matter. No intracranial hemorrhage, mass lesion, evidence of acute infarction, or extra-axial fluid collection. Vascular: Unremarkable Skull: Demineralized but intact Sinuses/Orbits: Clear Other: N/A IMPRESSION: Atrophy with small vessel chronic ischemic changes of deep cerebral white matter. No acute intracranial abnormalities. Electronically Signed   By: Ulyses Southward M.D.   On: 09/16/2016 11:27    Procedures Procedures (including critical care time)  Medications Ordered in ED Medications   stroke: mapping our early stages of recovery book (not administered)  0.9 %  sodium chloride infusion ( Intravenous New Bag/Given 09/16/16 1831)  senna-docusate (Senokot-S) tablet 1 tablet (not administered)  furosemide (LASIX) tablet 20 mg (20 mg Oral Not Given 09/16/16 1845)  levothyroxine (SYNTHROID, LEVOTHROID) tablet 75 mcg (not administered)  docusate sodium (COLACE) capsule 200 mg (200 mg Oral Not Given 09/16/16 1846)  donepezil (ARICEPT) tablet 10 mg (10 mg Oral Given 09/16/16 2100)  DULoxetine (CYMBALTA) DR capsule 60 mg (60 mg Oral Not Given 09/16/16 1847)  HYDROcodone-acetaminophen (NORCO/VICODIN) 5-325 MG per tablet 1 tablet (not administered)  pantoprazole (PROTONIX) EC tablet 20 mg (20 mg Oral Not Given 09/16/16 2101)  potassium chloride SA (K-DUR,KLOR-CON) CR tablet 20 mEq (20 mEq Oral Given 09/16/16 2101)  famotidine (PEPCID) tablet 20 mg (20 mg Oral Not Given 09/16/16 1847)  tiZANidine (ZANAFLEX) tablet 2 mg (not administered)  clopidogrel (PLAVIX) tablet 75 mg (not administered)  dorzolamide-timolol (COSOPT) 22.3-6.8 MG/ML ophthalmic solution 1 drop (1 drop Left Eye Given 09/17/16 0356)  ceFAZolin (ANCEF) IVPB 1 g/50 mL premix (1 g Intravenous Given 09/16/16 2059)  acetaminophen (TYLENOL) tablet 650 mg (650 mg Oral Given 09/16/16 2100)  mupirocin ointment (BACTROBAN) 2 % 1  application (1 application Nasal Given 09/17/16 0356)  Chlorhexidine Gluconate Cloth 2 % PADS 6 each (6 each Topical Given 09/17/16 0552)  HYDROcodone-acetaminophen (NORCO/VICODIN) 5-325 MG per tablet 1 tablet (1 tablet Oral Given 09/16/16 1150)     Initial Impression / Assessment and Plan / ED Course  I have reviewed the triage vital signs and the nursing notes.  Pertinent labs & imaging results that were available during my care of the patient were reviewed by me and considered in my medical decision making (see chart for details).  Clinical Course  82 yo F with PMHx as above who p/w difficulty swallowing x several days and visual/auditory hallucinations overnight. No complaints at this time. On arrival, VS are stable and WNL. Examination is as above. Primary concern is possible TIA/CVA given history of recent right eye CVA, also delirium 2/2 underlying medical condition - considerations include occult infection (UTI, PNA), metabolic derangements. EKG is non-iscemic. Will check labs, CT, and re-assess.  CT head negative. Labs overall reassuring. CXR is clear. UA without UTI. Discussed with Dr. Judithann Sheen of Neurology. Will admit for TIA/CVA work-up. Pt and family are in agreement. Admitted to Hospitalist.  Final Clinical Impressions(s) / ED Diagnoses   Final diagnoses:  Transient cerebral ischemia, unspecified transient cerebral ischemia type  Hallucinations    I personally performed the services described in this documentation, which was scribed in my presence. The recorded information has been reviewed and is accurate.     Shaune Pollack, MD 09/17/16 [phone]

## 2016-09-17 ENCOUNTER — Inpatient Hospital Stay (HOSPITAL_COMMUNITY): Payer: Medicare Other

## 2016-09-17 ENCOUNTER — Ambulatory Visit (HOSPITAL_COMMUNITY): Payer: Medicare Other

## 2016-09-17 DIAGNOSIS — H509 Unspecified strabismus: Secondary | ICD-10-CM

## 2016-09-17 DIAGNOSIS — G459 Transient cerebral ischemic attack, unspecified: Secondary | ICD-10-CM

## 2016-09-17 DIAGNOSIS — I639 Cerebral infarction, unspecified: Secondary | ICD-10-CM | POA: Diagnosis not present

## 2016-09-17 DIAGNOSIS — G9340 Encephalopathy, unspecified: Secondary | ICD-10-CM | POA: Diagnosis not present

## 2016-09-17 LAB — BASIC METABOLIC PANEL
Anion gap: 9 (ref 5–15)
BUN: 21 mg/dL — AB (ref 6–20)
CALCIUM: 8.8 mg/dL — AB (ref 8.9–10.3)
CHLORIDE: 107 mmol/L (ref 101–111)
CO2: 22 mmol/L (ref 22–32)
CREATININE: 1.38 mg/dL — AB (ref 0.44–1.00)
GFR calc Af Amer: 40 mL/min — ABNORMAL LOW (ref >60)
GFR calc non Af Amer: 34 mL/min — ABNORMAL LOW (ref >60)
Glucose, Bld: 90 mg/dL (ref 65–99)
Potassium: 4.5 mmol/L (ref 3.5–5.1)
Sodium: 138 mmol/L (ref 135–145)

## 2016-09-17 LAB — ECHOCARDIOGRAM COMPLETE
AOPV: 0.75 m/s
AV Area VTI index: 1.3 cm2/m2
AV Area VTI: 2.37 cm2
AV Area mean vel: 1.52 cm2
AV Mean grad: 11 mmHg
AV Peak grad: 18 mmHg
AV peak Index: 1.38
AV pk vel: 212 cm/s
AVAREAMEANVIN: 0.88 cm2/m2
AVCELMEANRAT: 0.48
AVLVOTPG: 10 mmHg
CHL CUP AV VEL: 2.23
CHL CUP MV DEC (S): 123
CHL CUP RV SYS PRESS: 26 mmHg
CHL CUP TV REG PEAK VELOCITY: 239 cm/s
DOP CAL AO MEAN VELOCITY: 152 cm/s
E decel time: 123 msec
E/e' ratio: 18.88
FS: 28 % (ref 28–44)
HEIGHTINCHES: 64 in
IV/PV OW: 0.93
LA ID, A-P, ES: 32 mm
LA diam end sys: 32 mm
LA vol A4C: 32.5 ml
LA vol index: 14.7 mL/m2
LA vol: 25.3 mL
LADIAMINDEX: 1.86 cm/m2
LDCA: 3.14 cm2
LV E/e' medial: 18.88
LV E/e'average: 18.88
LV TDI E'LATERAL: 6.09
LV TDI E'MEDIAL: 4.57
LV dias vol: 30 mL — AB (ref 46–106)
LV e' LATERAL: 6.09 cm/s
LVDIAVOLIN: 17 mL/m2
LVOT SV: 104 mL
LVOT VTI: 33.2 cm
LVOT peak vel: 160 cm/s
LVOTD: 20 mm
LVOTVTI: 0.71 cm
Lateral S' vel: 11.5 cm/s
MVPG: 5 mmHg
MVPKEVEL: 115 m/s
PW: 12.7 mm — AB (ref 0.6–1.1)
TAPSE: 10.8 mm
TR max vel: 239 cm/s
VTI: 46.8 cm
Valve area index: 1.3
Valve area: 2.23 cm2
WEIGHTICAEL: 2288 [oz_av]

## 2016-09-17 LAB — LIPID PANEL
CHOL/HDL RATIO: 5.6 ratio
CHOLESTEROL: 264 mg/dL — AB (ref 0–200)
HDL: 47 mg/dL (ref >40)
LDL Cholesterol: 189 mg/dL — ABNORMAL HIGH (ref 0–99)
Triglycerides: 140 mg/dL (ref <150)
VLDL: 28 mg/dL (ref 0–40)

## 2016-09-17 LAB — VITAMIN B12: VITAMIN B 12: 219 pg/mL (ref 180–914)

## 2016-09-17 LAB — C-REACTIVE PROTEIN

## 2016-09-17 MED ORDER — PANTOPRAZOLE SODIUM 40 MG PO TBEC
40.0000 mg | DELAYED_RELEASE_TABLET | Freq: Every day | ORAL | Status: DC
  Administered 2016-09-17: 40 mg via ORAL
  Filled 2016-09-17: qty 1

## 2016-09-17 MED ORDER — MUPIROCIN 2 % EX OINT: 1.0000 "application " | TOPICAL_OINTMENT | Freq: Two times a day (BID) | CUTANEOUS | 0 refills | Status: DC

## 2016-09-17 MED ORDER — MUPIROCIN 2 % EX OINT
1.0000 "application " | TOPICAL_OINTMENT | Freq: Two times a day (BID) | CUTANEOUS | Status: DC
  Administered 2016-09-17 (×2): 1 via NASAL
  Filled 2016-09-17: qty 22

## 2016-09-17 MED ORDER — FAMOTIDINE 20 MG PO TABS: 20.0000 mg | ORAL_TABLET | Freq: Every day | ORAL | Status: DC

## 2016-09-17 MED ORDER — CHLORHEXIDINE GLUCONATE CLOTH 2 % EX PADS
6.0000 | MEDICATED_PAD | Freq: Every day | CUTANEOUS | Status: DC
  Administered 2016-09-17: 6 via TOPICAL

## 2016-09-17 MED ORDER — CEFUROXIME AXETIL 250 MG PO TABS: 250.0000 mg | ORAL_TABLET | Freq: Two times a day (BID) | ORAL | 0 refills | Status: AC

## 2016-09-17 NOTE — Discharge Planning (Signed)
Pt d/c to snf, vitals stable , no c/o pain, pt left with family, discharge paperwork sent over to snf, no questions for the nurse

## 2016-09-17 NOTE — Evaluation (Signed)
Physical Therapy Evaluation Patient Details Name: Cynthia [name] MRN: [id number] DOB: [birth year] Today's Date: 09/17/2016   History of Present Illness  82 y.o. female with medical history significant of heart block and s/p pacemaker placement, renal insufficiency, hyperlipidemia, and HTN that was brought by her sons to the ED for evaluation after hearing music all night last night in her skilled nursing facility.  Patients symptoms initially began on September 17 when she states that her eye was having a hard time focusing.  She was seen by an eye doctor and was sent to the emergency department for retinal artery occlusion.  She was worked up with a CT scan of her head which was negative.  She was discharged back to the SNF with instructions to see her primary care physician to get worked up for a stroke.  However, between that time and her appointment she had an overnight episode of hearing music overnight.  Patient's sons state they noticed a change in her swallowing status today and when she told them she heard music all night they called her primary care doctor who encouraged them to bring her in for evaluation. Of note, patient did provide me with a different history than was given to the EDP by patient and her sons.  She denies chest pain, chest pressure, denies headache, sinus congestion, chest pain, chest pressure, shortness of breath, increased work of breathing, abdominal pain, dysuria, constipation or diarrhea.  Reports she does have some difficulty swallowing and difficulty with her vision- even more so since the retinal artery occlusion.  PMH: Arthritis, chronic diastolic heart failure, complete heart block s/p PPM, coronary atherosclerosis, depression, hypothyroidism, ischemic cardiomyopathy with EF: 45-50%, sarcoidosis, ACDF, lumbar lami L3, Si, PCI.    Clinical Impression  Pt received in bed, and was agreeable to PT/OT evaluation.  Pt lives at [place], and uses a rollator to get around  with.  Pt was only able to ambulate 4420ft with RW today before needing to stop due to fatigue.  Pt would benefit from continued HHPT upon d/c to progress strength, balance, as well as endurance.  Pt was limited to due to poor safety awareness, motor planning, emotionality, and overall cognition.    Follow Up Recommendations Home health PT    Equipment Recommendations  None recommended by PT    Recommendations for Other Services       Precautions / Restrictions Precautions Precautions: None Restrictions Weight Bearing Restrictions: No      Mobility  Bed Mobility Overal bed mobility: Needs Assistance Bed Mobility: Supine to Sit     Supine to sit: Supervision        Transfers Overall transfer level: Needs assistance Equipment used: Rolling walker (2 wheeled) Transfers: Sit to/from [employer] CorporationStand;Stand Pivot Transfers Sit to Stand: Min assist Stand pivot transfers: Mod assist (Pt had great difficulty attempting to transfer onto the commode in the bathroom due to inability to motor plan which way to turn.  )          Ambulation/Gait Ambulation/Gait assistance: Min assist Ambulation Distance (Feet): 20 Feet Assistive device: Rolling walker (2 wheeled) Gait Pattern/deviations: Step-through pattern;Trunk flexed     General Gait Details: Pt became very weak after ambulating to the bathroom and sink, and expressed need to sit down or she was going to give out.    Stairs            Wheelchair Mobility    Modified Rankin (Stroke Patients Only)       Balance Overall  balance assessment: Needs assistance Sitting-balance support: Bilateral upper extremity supported Sitting balance-Leahy Scale: Good     Standing balance support: Bilateral upper extremity supported Standing balance-Leahy Scale: Fair                               Pertinent Vitals/Pain Pain Assessment: 0-10 Pain Score: 7  Pain Location: HA & back Pain Descriptors / Indicators: Aching Pain  Intervention(s): Limited activity within patient's tolerance;Monitored during session;Repositioned    Home Living Family/patient expects to be discharged to:: Skilled nursing facility     Type of Home: Assisted living [facility])                Prior Function     Gait / Transfers Assistance Needed: Pt states she uses a RW for ambulation.   ADL's / Homemaking Assistance Needed: Pt states she is independent with dressing/bathing, however she is not a reliable historian.         Hand Dominance        Extremity/Trunk Assessment   Upper Extremity Assessment: Defer to OT evaluation           Lower Extremity Assessment: Generalized weakness         Communication   Communication: No difficulties  Cognition Arousal/Alertness: Awake/alert Behavior During Therapy: WFL for tasks assessed/performed Overall Cognitive Status: Within Functional Limits for tasks assessed (pt states she " went blind" and they think I had a stroke.)                      General Comments      Exercises     Assessment/Plan    PT Assessment Patient needs continued PT services  PT Problem List Decreased strength;Decreased activity tolerance;Decreased balance;Decreased mobility;Decreased cognition;Decreased safety awareness;Decreased knowledge of use of DME;Decreased knowledge of precautions          PT Treatment Interventions DME instruction;Gait training;Functional mobility training;Therapeutic activities;Therapeutic exercise;Balance training;Cognitive remediation;Patient/family education    PT Goals (Current goals can be found in the Care Plan section)  Acute Rehab PT Goals Patient Stated Goal: Pt wants to get stronger.  PT Goal Formulation: With patient Time For Goal Achievement: 09/24/16 Potential to Achieve Goals: Fair    Frequency Min 3X/week   Barriers to discharge        Co-evaluation PT/OT/SLP Co-Evaluation/Treatment: Yes Reason for Co-Treatment: Complexity of  the patient's impairments (multi-system involvement) PT goals addressed during session: Mobility/safety with mobility;Balance;Proper use of DME OT goals addressed during session: ADL's and self-care;Proper use of Adaptive equipment and DME       End of Session Equipment Utilized During Treatment: Gait belt Activity Tolerance: Patient tolerated treatment well Patient left: in chair;with call bell/phone within reach      Functional Assessment Tool Used: [employer] "6-clicks"  Functional Limitation: Mobility: Walking and moving around Mobility: Walking and Moving Around Current Status [phone]): At least 40 percent but less than 60 percent impaired, limited or restricted Mobility: Walking and Moving Around Goal Status [phone]): At least 20 percent but less than 40 percent impaired, limited or restricted    Time: 1136-1207 PT Time Calculation (min) (ACUTE ONLY): 31 min   Charges:   PT Evaluation $PT Eval Low Complexity: 1 Procedure     PT G Codes:   PT G-Codes **NOT FOR INPATIENT CLASS** Functional Assessment Tool Used: [employer] "6-clicks"  Functional Limitation: Mobility: Walking and moving around Mobility: Walking and Moving Around  Current Status [phone]): At least 40 percent but less than 60 percent impaired, limited or restricted Mobility: Walking and Moving Around Goal Status [phone]): At least 20 percent but less than 40 percent impaired, limited or restricted    Beth Chalee Hirota, PT, DPT X: [phone]

## 2016-09-17 NOTE — Discharge Summary (Addendum)
Physician Discharge Summary  Cynthia [name] UEA:[id number] DOB: [birth year] DOA: 09/16/2016  PCP: Berenice Primas  Admit date: 09/16/2016 Discharge date: 09/17/2016  Admitted From:  SNF- [facility] Disposition:  [facility]  Recommendations for Outpatient Follow-up:  1. Follow up with PCP in 1-2 weeks 2. Please obtain BMP/CBC in one week 3. Please follow up on the following pending results:  Home Health:No Equipment/Devices: None  Discharge Condition:Stable CODE STATUS: DNR/ DNI Diet recommendation: Heart Healthy  Brief/Interim Summary: Cynthia [name] is a 82 y.o. female with medical history significant of heart block and s/p pacemaker placement, renal insufficiency, hyperlipidemia, and HTN that was brought by her sons to the ED for evaluation after hearing music all night last night in her skilled nursing facility.  Patients symptoms initially began on September 17 when she states that her eye was having a hard time focusing.  She was seen by an eye doctor and was sent to the emergency department for retinal artery occlusion.  She was worked up with a CT scan of her head which was negative.  She was discharged back to the SNF with instructions to see her primary care physician to get worked up for a stroke.  However, between that time and her appointment she had an overnight episode of hearing music overnight.  Patient's sons state they noticed a change in her swallowing status today and when she told them she heard music all night they called her primary care doctor who encouraged them to bring her in for evaluation. Of note, patient did provide me with a different history than was given to the EDP by patient and her sons.  She denies chest pain, chest pressure, denies headache, sinus congestion, chest pain, chest pressure, shortness of breath, increased work of breathing, abdominal pain, dysuria, constipation or diarrhea.  Reports she does have some difficulty swallowing and difficulty with  her vision- even more so since the retinal artery occlusion. Neurology consulted and orders placed to help evaluate for temporal arteritis.  CRP negative with slightly elevated ESR.  Patient to follow up with her own PCP outpatient and Dr. Merlene Laughter after discharge.  Was found to have an urinary tract infection and treatment was initiated.  Discharge Diagnoses:  Active Problems:   Delirium   Retinal artery occlusion   Strabismus   Left-sided weakness   TIA (transient ischemic attack)    Discharge Instructions  Discharge Instructions    Call MD for:  persistant dizziness or light-headedness    Complete by:  As directed    Call MD for:  persistant dizziness or light-headedness    Complete by:  As directed    Call MD for:  persistant nausea and vomiting    Complete by:  As directed    Diet - low sodium heart healthy    Complete by:  As directed    Diet - low sodium heart healthy    Complete by:  As directed    Increase activity slowly    Complete by:  As directed    Increase activity slowly    Complete by:  As directed        Medication List    TAKE these medications   acetaminophen 650 MG CR tablet Commonly known as:  TYLENOL Take 650 mg by mouth at bedtime. *May take one tablet every 6 hours as needed for pain   atorvastatin 20 MG tablet Commonly known as:  LIPITOR Take 20 mg by mouth daily.   cefUROXime 250 MG  tablet Commonly known as:  CEFTIN Take 1 tablet (250 mg total) by mouth 2 (two) times daily with a meal.   clopidogrel 75 MG tablet Commonly known as:  PLAVIX TAKE ONE TABLET BY MOUTH DAILY WITH BREAKFAST   docusate sodium 100 MG capsule Commonly known as:  COLACE Take 200 mg by mouth daily.   donepezil 10 MG tablet Commonly known as:  ARICEPT Take 10 mg by mouth at bedtime.   dorzolamide-timolol 22.3-6.8 MG/ML ophthalmic solution Commonly known as:  COSOPT Place 1 drop into the left eye 2 (two) times daily.   DULoxetine 60 MG capsule Commonly  known as:  CYMBALTA Take 60 mg by mouth daily.   furosemide 20 MG tablet Commonly known as:  LASIX Take 20 mg by mouth daily.   HYDROcodone-acetaminophen 5-325 MG tablet Commonly known as:  NORCO/VICODIN Take 1 tablet by mouth every 8 (eight) hours as needed for moderate pain.   levothyroxine 75 MCG tablet Commonly known as:  SYNTHROID, LEVOTHROID Take 75 mcg by mouth daily before breakfast.   multivitamin with minerals Tabs tablet Take 1 tablet by mouth daily.   mupirocin ointment 2 % Commonly known as:  BACTROBAN Place 1 application into the nose 2 (two) times daily.   OSCAL 500/200 D-3 500-200 MG-UNIT tablet Generic drug:  calcium-vitamin D Take 1 tablet by mouth 2 (two) times daily.   pantoprazole 20 MG tablet Commonly known as:  PROTONIX Take 20 mg by mouth daily.   potassium chloride SA 20 MEQ tablet Commonly known as:  K-DUR,KLOR-CON Take 20 mEq by mouth daily.   ranitidine 150 MG tablet Commonly known as:  ZANTAC Take 150 mg by mouth at bedtime.   tiZANidine 2 MG tablet Commonly known as:  ZANAFLEX Take 2 mg by mouth every 8 (eight) hours as needed for muscle spasms.   Vitamin D-3 1000 units Caps Take 1 capsule by mouth daily.      Follow-up Information    DOONQUAH, KOFI, MD. Schedule an appointment as soon as possible for a visit in 1 month(s).   Specialty:  Neurology Contact information: [address] [place] [address] [phone]          Allergies  Allergen Reactions  . Statins     Consultations:  Neurology  Physical Therapy   Procedures/Studies: Dg Chest 2 View  Result Date: 09/16/2016 CLINICAL DATA:  82 year old female with headache. Initial encounter. EXAM: CHEST  2 VIEW COMPARISON:  [facility] portable chest radiographs 04/26/2015 and earlier. FINDINGS: Stable left chest dual lead cardiac pacemaker. Normal cardiac size and mediastinal contours. Calcified aortic atherosclerosis. Chronic lower thoracic  compression fracture with prior augmentation. Chronic cervical ACDF hardware. Stable cholecystectomy clips. Stable lung volumes with mild elevation of the right hemidiaphragm. Occasional patchy probable parenchymal scarring in the right lung appears stable since 2016. No pneumothorax, pulmonary edema, pleural effusion or acute pulmonary opacity. IMPRESSION: No acute cardiopulmonary abnormality. Calcified aortic atherosclerosis. Electronically Signed   By: Genevie Ann M.D.   On: 09/16/2016 11:10   Ct Head Wo Contrast  Result Date: 09/16/2016 CLINICAL DATA:  Altered mental status, history coronary artery disease, chronic diastolic CHF, sarcoidosis, GERD, ischemic cardiomyopathy, benign hypertension EXAM: CT HEAD WITHOUT CONTRAST TECHNIQUE: Contiguous axial images were obtained from the base of the skull through the vertex without intravenous contrast. COMPARISON:  09/12/2016 FINDINGS: Brain: Generalized atrophy. Normal ventricular morphology. No midline shift or mass effect. Small vessel chronic ischemic changes of deep cerebral white matter. No intracranial hemorrhage, mass  lesion, evidence of acute infarction, or extra-axial fluid collection. Vascular: Unremarkable Skull: Demineralized but intact Sinuses/Orbits: Clear Other: N/A IMPRESSION: Atrophy with small vessel chronic ischemic changes of deep cerebral white matter. No acute intracranial abnormalities. Electronically Signed   By: Lavonia Dana M.D.   On: 09/16/2016 11:27   Ct Head Wo Contrast  Result Date: 09/12/2016 CLINICAL DATA:  Acute onset of decreased vision, worse on the left. Headache. Initial encounter. EXAM: CT HEAD WITHOUT CONTRAST TECHNIQUE: Contiguous axial images were obtained from the base of the skull through the vertex without intravenous contrast. COMPARISON:  CT of the head performed 06/17/2007 FINDINGS: Brain: No evidence of acute infarction, hemorrhage, hydrocephalus, extra-axial collection or mass lesion/mass effect. Prominence of the  ventricles and sulci reflects mild to moderate cortical volume loss. Mild cerebellar atrophy is noted. Mild periventricular and subcortical white matter change likely reflects small vessel ischemic microangiopathy. The brainstem and fourth ventricle are within normal limits. The basal ganglia are unremarkable in appearance. The cerebral hemispheres demonstrate grossly normal gray-white differentiation. No mass effect or midline shift is seen. Vascular: No hyperdense vessel or unexpected calcification. Skull: There is no evidence of fracture; visualized osseous structures are unremarkable in appearance. Sinuses/Orbits: The visualized portions of the orbits are within normal limits. The paranasal sinuses and mastoid air cells are well-aerated. Other: No significant soft tissue abnormalities are seen. IMPRESSION: 1. No acute intracranial pathology seen on CT. 2. Mild to moderate cortical volume loss and scattered small vessel ischemic microangiopathy. Electronically Signed   By: Garald Balding M.D.   On: 09/12/2016 22:00   US Carotid Bilateral (at [facility] Only)  Result Date: 09/17/2016 CLINICAL DATA:  TIA, hallucinations, cephalgia, diploplia. Hypertension, coronary disease. EXAM: BILATERAL CAROTID DUPLEX ULTRASOUND TECHNIQUE: Pearline Cables scale imaging, color Doppler and duplex ultrasound was performed of bilateral carotid and vertebral arteries in the neck. COMPARISON:  06/06/2007 TECHNIQUE: Quantification of carotid stenosis is based on velocity parameters that correlate the residual internal carotid diameter with NASCET-based stenosis levels, using the diameter of the distal internal carotid lumen as the denominator for stenosis measurement. The following velocity measurements were obtained: PEAK SYSTOLIC/END DIASTOLIC RIGHT ICA:                     113/31cm/sec CCA:                     631/49FW/YOV SYSTOLIC ICA/CCA RATIO:  7.85 DIASTOLIC ICA/CCA RATIO: 8.85 ECA:                     58cm/sec LEFT ICA:                      107/29cm/sec CCA:                     02/77AJ/OIN SYSTOLIC ICA/CCA RATIO:  8.67 DIASTOLIC ICA/CCA RATIO: 6.72 ECA:  72 Cm/sec FINDINGS: RIGHT CAROTID ARTERY: Mild eccentric partially calcified plaque in the carotid bulb extending to the ICA origin. No high-grade stenosis. Normal waveforms and color Doppler signal. Distal ICA tortuous. RIGHT VERTEBRAL ARTERY:  Normal flow direction and waveform. LEFT CAROTID ARTERY: Partial calcified plaque in the bulb and proximal ICA. No high-grade stenosis. Normal waveforms and color Doppler signal. LEFT VERTEBRAL ARTERY: Normal flow direction and waveform. IMPRESSION: 1. Mild bilateral carotid bifurcation and proximal ICA plaque, resulting in less than 50% diameter stenosis. The exam does not exclude plaque ulceration or embolization. Continued surveillance recommended. 2.  Antegrade bilateral vertebral arterial flow. Electronically Signed   By: Lucrezia Europe M.D.   On: 09/17/2016 10:39     Subjective: Patient voices she is hungry and she wants to get out of her chair and into the bed.  She denies any new pain but does voice she has a headache.  She mentions that she feels slightly better than yesterday in terms of her hearing things.  Did not hear any music played overnight.  Discharge Exam: Vitals:   09/17/16 1130 09/17/16 1530  BP: 125/64 125/60  Pulse: 82 77  Resp: 18 20  Temp: 98.6 F (37 C) 98.3 F (36.8 C)   Vitals:   09/17/16 0330 09/17/16 0730 09/17/16 1130 09/17/16 1530  BP: 129/62 128/68 125/64 125/60  Pulse: 73 76 82 77  Resp: _0 Temp: 98.7 F (37.1 C) 98.6 F (37 C) 98.6 F (37 C) 98.3 F (36.8 C)  TempSrc: Oral Oral Oral Oral  SpO2: 94% 97% 98% 97%  Weight:      Height:        General: Pt is alert, awake, not in acute distress Cardiovascular: RRR, II/VI systolic murmur heard best at pulmonic area, S1/S2 +, no rubs, no gallops Respiratory: CTA bilaterally, no wheezing, no rhonchi Abdominal: Soft, NT, ND, bowel sounds  + Extremities: no edema, no cyanosis, ambulated with assistance    The results of significant diagnostics from this hospitalization (including imaging, microbiology, ancillary and laboratory) are listed below for reference.     Microbiology: Recent Results (from the past 240 hour(s))  MRSA PCR Screening     Status: Abnormal   Collection Time: 09/16/16  6:30 PM  Result Value Ref Range Status   MRSA by PCR POSITIVE (A) NEGATIVE Final    Comment:        The GeneXpert MRSA Assay (FDA approved for NASAL specimens only), is one component of a comprehensive MRSA colonization surveillance program. It is not intended to diagnose MRSA infection nor to guide or monitor treatment for MRSA infections. RESULT CALLED TO, READ BACK BY AND VERIFIED WITH: Shughart,C ON 09/16/16 AT 2205 BY LOY,C      Labs: BNP (last 3 results) No results for input(s): BNP in the last 8760 hours. Basic Metabolic Panel:  Recent Labs Lab 09/12/16 2128 09/16/16 1054 09/17/16 0652  NA 139 137 138  K 4.0 4.1 4.5  CL 107 103 107  CO2 _1 GLUCOSE 128* 107* 90  BUN 25* 20 21*  CREATININE 1.82* 1.52* 1.38*  CALCIUM 9.1 9.1 8.8*   Liver Function Tests:  Recent Labs Lab 09/16/16 1054  AST 25  ALT 14  ALKPHOS 63  BILITOT 0.5  PROT 7.1  ALBUMIN 3.9   No results for input(s): LIPASE, AMYLASE in the last 168 hours. No results for input(s): AMMONIA in the last 168 hours. CBC:  Recent Labs Lab 09/12/16 2128 09/16/16 1054  WBC 5.8 6.1  NEUTROABS 4.1 4.2  HGB 12.4 13.9  HCT 38.6 42.2  MCV 93.0 93.4  PLT 214 189   Cardiac Enzymes: No results for input(s): CKTOTAL, CKMB, CKMBINDEX, TROPONINI in the last 168 hours. BNP: Invalid input(s): POCBNP CBG:  Recent Labs Lab 09/16/16 0939  GLUCAP 109*   D-Dimer No results for input(s): DDIMER in the last 72 hours. Hgb A1c No results for input(s): HGBA1C in the last 72 hours. Lipid Profile  Recent Labs  09/17/16 0652  CHOL 264*  HDL 47   LDLCALC 189*  TRIG  140  CHOLHDL 5.6   Thyroid function studies  Recent Labs  09/16/16 1054  TSH 1.182   Anemia work up  Recent Labs  09/16/16 1813  VITAMINB12 219   Urinalysis    Component Value Date/Time   COLORURINE YELLOW 09/16/2016 [place] 09/16/2016 1013   LABSPEC 1.010 09/16/2016 1013   PHURINE 7.0 09/16/2016 1013   GLUCOSEU NEGATIVE 09/16/2016 1013   HGBUR NEGATIVE 09/16/2016 1013   [address] 09/16/2016 1013   KETONESUR NEGATIVE 09/16/2016 1013   PROTEINUR TRACE (A) 09/16/2016 1013   UROBILINOGEN 1.0 12/03/2010 1528   NITRITE NEGATIVE 09/16/2016 1013   LEUKOCYTESUR SMALL (A) 09/16/2016 1013   Sepsis Labs Invalid input(s): PROCALCITONIN,  WBC,  LACTICIDVEN Microbiology Recent Results (from the past 240 hour(s))  MRSA PCR Screening     Status: Abnormal   Collection Time: 09/16/16  6:30 PM  Result Value Ref Range Status   MRSA by PCR POSITIVE (A) NEGATIVE Final    Comment:        The GeneXpert MRSA Assay (FDA approved for NASAL specimens only), is one component of a comprehensive MRSA colonization surveillance program. It is not intended to diagnose MRSA infection nor to guide or monitor treatment for MRSA infections. RESULT CALLED TO, READ BACK BY AND VERIFIED WITH: Dumler,C ON 09/16/16 AT 2205 BY LOY,C      Time coordinating discharge: Less than 30 minutes  SIGNED:   Newman Pies, MD  [facility] 09/17/2016, 5:02 PM Pager [phone] If 7PM-7AM, please contact night-coverage [email or web address] Password [email or web address]

## 2016-09-17 NOTE — Progress Notes (Signed)
*  PRELIMINARY RESULTS* Echocardiogram 2D Echocardiogram has been performed.  Stacey DrainWhite, Tyreonna Czaplicki J 09/17/2016, 4:30 PM

## 2016-09-17 NOTE — Progress Notes (Signed)
[place] A. Merlene Laughter, MD     [email or web address]          DEION FORGUE is an 82 y.o. female.   Assessment/Plan: 1. Acute brainstem infarct likely involving the left pontine region presenting with a left internuclear ophthalmoplegia, mild dysarthria and the gait ataxia. This is most likely due to deep perforating small blood vessel.  Risk factors coronary artery disease, dyslipidemia and ischemic cardiomyopathy. The patient has prominent headaches with her symptoms this raises the potential for temporal arteritis.   Recommendations: Additional labs for the following angiotensin-converting enzyme given the history of sarcoidosis, C-reactive protein, ESR, homocystine and vitamin B12 levels. Aspirin 325 daily. Follow the patient clinically. Will be good to additional imaging such as MRA/MRI but the patient has a pacemaker. Additionally, CTA would be good but she has some chronic renal impairment.    She continues to complain of headaches all day long. Headaches posterior occipital area/occiput and also bilateral frontal. The patient reports that she had double vision today while examining the patient. She has a tells me that she had triplopia before presented to the hospital.    GENERAL: Pleasant obese female in no acute distress.  HEENT: There is a marked soreness on palpation involving the temporal region bilaterally.  ABDOMEN: soft  EXTREMITIES: No edema   BACK: Normal  SKIN: Normal by inspection.    MENTAL STATUS: Alert and oriented - including orientation to age and month. Speech, language and cognition are generally intact. Judgment and insight normal.   CRANIAL NERVES: There is marked exotropia of the left eye with the pupil spherical and irregular in shape and sluggishly reactive; the right pupil is 4 mm and briskly reactive. The right eye does not cross the midline but otherwise has full ductions vertically and horizontally; the left eye  has full range of motion but is associated with significant left beating nystagmus with movement to the left; visual fields are full;  upper and lower facial muscles are normal in strength and symmetric, there is no flattening of the nasolabial folds; tongue is midline; uvula is midline; shoulder elevation is normal.  MOTOR: Bulk and tone are normal throughout. The patient has global weakness 4+/5 in upper and lower extremities bilaterally. There is a mild downward drift of the left upper extremity and left leg. No drift on the right side.  COORDINATION: Left finger to nose is normal, right finger to nose is normal, No rest tremor; no intention tremor; no postural tremor; no bradykinesia.   SENSATION: Normal to light touch and temperature.    Objective: Vital signs in last 24 hours: Temp:  [97.8 F (36.6 C)-98.7 F (37.1 C)] 98.6 F (37 C) (09/22 0730) Pulse Rate:  [61-77] 76 (09/22 0730) Resp:  [15-18] 18 (09/22 0730) BP: (112-150)/(53-73) 128/68 (09/22 0730) SpO2:  [94 %-98 %] 97 % (09/22 0730)  Intake/Output from previous day: 09/21 0701 - 09/22 0700 In: 474.2 [I.V.:424.2; IV Piggyback:50] Out: 700 [Urine:700] Intake/Output this shift: No intake/output data recorded. Nutritional status: Diet clear liquid Room service appropriate? Yes; Fluid consistency: Thin   Lab Results: Results for orders placed or performed during the hospital encounter of 09/16/16 (from the past 48 hour(s))  CBG monitoring, ED     Status: Abnormal   Collection Time: 09/16/16  9:39 AM  Result Value Ref Range   Glucose-Capillary 109 (H) 65 - 99 mg/dL  Urinalysis, Routine w reflex microscopic (not at [facility])     Status: Abnormal   Collection Time: 09/16/16  10:13 AM  Result Value Ref Range   Color, Urine YELLOW YELLOW   APPearance CLEAR CLEAR   Specific Gravity, Urine 1.010 1.005 - 1.030   pH 7.0 5.0 - 8.0   Glucose, UA NEGATIVE NEGATIVE mg/dL   Hgb urine dipstick NEGATIVE NEGATIVE   Bilirubin  Urine NEGATIVE NEGATIVE   Ketones, ur NEGATIVE NEGATIVE mg/dL   Protein, ur TRACE (A) NEGATIVE mg/dL   Nitrite NEGATIVE NEGATIVE   Leukocytes, UA SMALL (A) NEGATIVE  Urine microscopic-add on     Status: Abnormal   Collection Time: 09/16/16 10:13 AM  Result Value Ref Range   Squamous Epithelial / LPF 0-5 (A) NONE SEEN   WBC, UA 0-5 0 - 5 WBC/hpf   RBC / HPF 0-5 0 - 5 RBC/hpf   Bacteria, UA FEW (A) NONE SEEN  CBC with Differential     Status: None   Collection Time: 09/16/16 10:54 AM  Result Value Ref Range   WBC 6.1 4.0 - 10.5 K/uL   RBC 4.52 3.87 - 5.11 MIL/uL   Hemoglobin 13.9 12.0 - 15.0 g/dL   HCT 42.2 36.0 - 46.0 %   MCV 93.4 78.0 - 100.0 fL   MCH 30.8 26.0 - 34.0 pg   MCHC 32.9 30.0 - 36.0 g/dL   RDW 13.1 11.5 - 15.5 %   Platelets 189 150 - 400 K/uL   Neutrophils Relative % 69 %   Neutro Abs 4.2 1.7 - 7.7 K/uL   Lymphocytes Relative 19 %   Lymphs Abs 1.2 0.7 - 4.0 K/uL   Monocytes Relative 8 %   Monocytes Absolute 0.5 0.1 - 1.0 K/uL   Eosinophils Relative 3 %   Eosinophils Absolute 0.2 0.0 - 0.7 K/uL   Basophils Relative 1 %   Basophils Absolute 0.0 0.0 - 0.1 K/uL  Comprehensive metabolic panel     Status: Abnormal   Collection Time: 09/16/16 10:54 AM  Result Value Ref Range   Sodium 137 135 - 145 mmol/L   Potassium 4.1 3.5 - 5.1 mmol/L   Chloride 103 101 - 111 mmol/L   CO2 27 22 - 32 mmol/L   Glucose, Bld 107 (H) 65 - 99 mg/dL   BUN 20 6 - 20 mg/dL   Creatinine, Ser 1.52 (H) 0.44 - 1.00 mg/dL   Calcium 9.1 8.9 - 10.3 mg/dL   Total Protein 7.1 6.5 - 8.1 g/dL   Albumin 3.9 3.5 - 5.0 g/dL   AST 25 15 - 41 U/L   ALT 14 14 - 54 U/L   Alkaline Phosphatase 63 38 - 126 U/L   Total Bilirubin 0.5 0.3 - 1.2 mg/dL   GFR calc non Af Amer 31 (L) >60 mL/min   GFR calc Af Amer 35 (L) >60 mL/min    Comment: (NOTE) The eGFR has been calculated using the CKD EPI equation. This calculation has not been validated in all clinical situations. eGFR's persistently <60 mL/min  signify possible Chronic Kidney Disease.    Anion gap 7 5 - 15  TSH     Status: None   Collection Time: 09/16/16 10:54 AM  Result Value Ref Range   TSH 1.182 0.350 - 4.500 uIU/mL  Sedimentation rate     Status: Abnormal   Collection Time: 09/16/16  6:13 PM  Result Value Ref Range   Sed Rate 33 (H) 0 - 22 mm/hr  MRSA PCR Screening     Status: Abnormal   Collection Time: 09/16/16  6:30 PM  Result Value Ref Range  MRSA by PCR POSITIVE (A) NEGATIVE    Comment:        The GeneXpert MRSA Assay (FDA approved for NASAL specimens only), is one component of a comprehensive MRSA colonization surveillance program. It is not intended to diagnose MRSA infection nor to guide or monitor treatment for MRSA infections. RESULT CALLED TO, READ BACK BY AND VERIFIED WITH: Jagodzinski,C ON 09/16/16 AT 2205 BY LOY,C   Lipid panel     Status: Abnormal   Collection Time: 09/17/16  6:52 AM  Result Value Ref Range   Cholesterol 264 (H) 0 - 200 mg/dL   Triglycerides 140 <150 mg/dL   HDL 47 >40 mg/dL   Total CHOL/HDL Ratio 5.6 RATIO   VLDL 28 0 - 40 mg/dL   LDL Cholesterol 189 (H) 0 - 99 mg/dL    Comment:        Total Cholesterol/HDL:CHD Risk Coronary Heart Disease Risk Table                     Men   Women  1/2 Average Risk   3.4   3.3  Average Risk       5.0   4.4  2 X Average Risk   9.6   7.1  3 X Average Risk  23.4   11.0        Use the calculated Patient Ratio above and the CHD Risk Table to determine the patient's CHD Risk.        ATP III CLASSIFICATION (LDL):  <100     mg/dL   Optimal  100-129  mg/dL   Near or Above                    Optimal  130-159  mg/dL   Borderline  160-189  mg/dL   High  >190     mg/dL   Very High   Basic metabolic panel     Status: Abnormal   Collection Time: 09/17/16  6:52 AM  Result Value Ref Range   Sodium 138 135 - 145 mmol/L   Potassium 4.5 3.5 - 5.1 mmol/L   Chloride 107 101 - 111 mmol/L   CO2 22 22 - 32 mmol/L   Glucose, Bld 90 65 - 99 mg/dL    BUN 21 (H) 6 - 20 mg/dL   Creatinine, Ser 1.38 (H) 0.44 - 1.00 mg/dL   Calcium 8.8 (L) 8.9 - 10.3 mg/dL   GFR calc non Af Amer 34 (L) >60 mL/min   GFR calc Af Amer 40 (L) >60 mL/min    Comment: (NOTE) The eGFR has been calculated using the CKD EPI equation. This calculation has not been validated in all clinical situations. eGFR's persistently <60 mL/min signify possible Chronic Kidney Disease.    Anion gap 9 5 - 15    Lipid Panel  Recent Labs  09/17/16 0652  CHOL 264*  TRIG 140  HDL 47  CHOLHDL 5.6  VLDL 28  LDLCALC 189*    Studies/Results:   Medications:  Scheduled Meds: . acetaminophen  650 mg Oral QHS  .  ceFAZolin (ANCEF) IV  1 g Intravenous Q12H  . Chlorhexidine Gluconate Cloth  6 each Topical Q0600  . clopidogrel  75 mg Oral Q breakfast  . docusate sodium  200 mg Oral Daily  . donepezil  10 mg Oral QHS  . dorzolamide-timolol  1 drop Left Eye BID  . DULoxetine  60 mg Oral Daily  . [START ON 09/18/2016] famotidine  20 mg Oral QHS  . furosemide  20 mg Oral Daily  . levothyroxine  75 mcg Oral QAC breakfast  . mupirocin ointment  1 application Nasal BID  . pantoprazole  40 mg Oral Daily  . potassium chloride SA  20 mEq Oral Daily   Continuous Infusions:  PRN Meds:.HYDROcodone-acetaminophen, senna-docusate, tiZANidine     LOS: 1 day   Vessie Olmsted A. Merlene Laughter, M.D.  Diplomate, Tax adviser of Psychiatry and Neurology ( Neurology).

## 2016-09-17 NOTE — Evaluation (Signed)
Occupational Therapy Evaluation Patient Details Name: Cynthia [name] MRN: [id number] DOB: 1931/10/09 Today's Date: 09/17/2016    History of Present Illness 82 y.o. female with medical history significant of heart block and s/p pacemaker placement, renal insufficiency, hyperlipidemia, and HTN that was brought by her sons to the ED for evaluation after hearing music all night last night in her skilled nursing facility.  Patients symptoms initially began on September 17 when she states that her eye was having a hard time focusing.  She was seen by an eye doctor and was sent to the emergency department for retinal artery occlusion.  She was worked up with a CT scan of her head which was negative.  She was discharged back to the SNF with instructions to see her primary care physician to get worked up for a stroke.  However, between that time and her appointment she had an overnight episode of hearing music overnight.  Patient's sons state they noticed a change in her swallowing status today and when she told them she heard music all night they called her primary care doctor who encouraged them to bring her in for evaluation. Of note, patient did provide me with a different history than was given to the EDP by patient and her sons.  She denies chest pain, chest pressure, denies headache, sinus congestion, chest pain, chest pressure, shortness of breath, increased work of breathing, abdominal pain, dysuria, constipation or diarrhea.  Reports she does have some difficulty swallowing and difficulty with her vision- even more so since the retinal artery occlusion.  PMH: Arthritis, chronic diastolic heart failure, complete heart block s/p PPM, coronary atherosclerosis, depression, hypothyroidism, ischemic cardiomyopathy with EF: 45-50%, sarcoidosis, ACDF, lumbar lami L3, Si, PCI.   Clinical Impression   Pt awake, alert, and was agreeable to PT/OT evaluation.  Pt lives at [facility], and uses a rollator for  functional mobility.  Pt reports staff are available to assist with ADLs, however she is generally independent in completion. Pt only able to ambulate from bed to bathroom to sink before needed to sit due to fatigue. Pt demonstrates poor safety awareness during evaluation, requiring verbal cuing for sequencing, as well as overall poor cognition with planning during ADL completion. Recommend d/c back to ALF with 24/7 supervision and assistance as needed for ADL completion.         Follow Up Recommendations  Other (comment) (return to ALF)    Equipment Recommendations  None recommended by OT       Precautions / Restrictions Precautions Precautions: None Restrictions Weight Bearing Restrictions: No      Mobility Bed Mobility Overal bed mobility: Needs Assistance Bed Mobility: Supine to Sit     Supine to sit: Supervision        Transfers Overall transfer level: Needs assistance Equipment used: Rolling walker (2 wheeled) Transfers: Sit to/from [employer] CorporationStand;Stand Pivot Transfers Sit to Stand: Min assist Stand pivot transfers: Mod assist            Balance Overall balance assessment: Needs assistance Sitting-balance support: Bilateral upper extremity supported Sitting balance-Leahy Scale: Good     Standing balance support: Bilateral upper extremity supported Standing balance-Leahy Scale: Fair                              ADL Overall ADL's : Needs assistance/impaired     Grooming: Wash/dry hands;Min guard;Standing  Toilet Transfer: Moderate assistance;RW Toilet Transfer Details (indicate cue type and reason): Pt with difficulty determining which way to turn and for grab bar use Toileting- Clothing Manipulation and Hygiene: Min guard;Sit to/from stand       Functional mobility during ADLs: Min guard;Minimal assistance;Rolling walker       Vision Additional Comments: Pt wearing eye patch over right eye          Pertinent  Vitals/Pain Pain Assessment: 0-10 Pain Score: 7  Pain Location: HA and back Pain Descriptors / Indicators: Aching Pain Intervention(s): Limited activity within patient's tolerance;Monitored during session;Repositioned     Hand Dominance Right   Extremity/Trunk Assessment Upper Extremity Assessment Upper Extremity Assessment: Generalized weakness   Lower Extremity Assessment Lower Extremity Assessment: Defer to PT evaluation       Communication Communication Communication: No difficulties   Cognition Arousal/Alertness: Awake/alert Behavior During Therapy: WFL for tasks assessed/performed Overall Cognitive Status: Within Functional Limits for tasks assessed                                Home Living Family/patient expects to be discharged to:: Skilled nursing facility     Type of Home: Assisted living [facility] in [place])                                  Prior Functioning/Environment Level of Independence: Independent with assistive device(s)  Gait / Transfers Assistance Needed: Pt states she uses a RW for ambulation.  ADL's / Homemaking Assistance Needed: Pt states she is independent with dressing/bathing, however she is not a reliable historian.             OT Problem List: Decreased activity tolerance   OT Treatment/Interventions:      OT Goals(Current goals can be found in the care plan section) Acute Rehab OT Goals Patient Stated Goal: Pt wants to get stronger.   OT Frequency:             Co-evaluation PT/OT/SLP Co-Evaluation/Treatment: Yes Reason for Co-Treatment: Complexity of the patient's impairments (multi-system involvement) PT goals addressed during session: Mobility/safety with mobility;Balance;Proper use of DME OT goals addressed during session: ADL's and self-care;Proper use of Adaptive equipment and DME      End of Session Equipment Utilized During Treatment: Gait belt;Rolling walker  Activity Tolerance: Patient  limited by fatigue Patient left: in chair;with call bell/phone within reach   Time: 1136-1207 OT Time Calculation (min): 31 min Charges:  OT General Charges $OT Visit: 1 Procedure OT Evaluation $OT Eval Moderate Complexity: 1 Procedure Ezra Sites, OTR/L  [phone] 09/17/2016, 3:30 PM

## 2016-09-17 NOTE — Progress Notes (Signed)
SLP Cancellation Note  Patient Details Name: Cynthia [name] MRN: [id number] DOB: November 05, 1931   Cancelled treatment:       Reason Eval/Treat Not Completed: Pt currently discharging facility to ALF; No acute intracranial abnormalities per neurological imaging however recommend cognitive linguistic evaluation at ALF by SLP as safety concerns were noted during physical therapy evaluation   Marcene Duoshelsea Sumney MA, CCC-SLP Acute Care Speech Language Pathologist    Kennieth RadSumney, Rocco Kerkhoff E 09/17/2016, 4:52 PM

## 2016-09-17 NOTE — Care Management Important Message (Signed)
Important Message  Patient Details  Name: Cynthia [name] MRN: [id number] Date of Birth: 11-11-1931   Medicare Important Message Given:  Yes    Maurie Olesen, Chrystine OilerSharley Diane, RN 09/17/2016, 2:25 PM

## 2016-09-17 NOTE — Progress Notes (Signed)
*  PRELIMINARY RESULTS* Echocardiogram 2D Echocardiogram has been performed.  Cynthia [name], Cynthia [name] 09/17/2016, 4:35 PM

## 2016-09-17 NOTE — Clinical Social Work Note (Signed)
Clinical Social Work Assessment  Patient Details  Name: Cynthia [name] MRN: [id number] Date of Birth: 1931-01-04  Date of referral:  09/17/16               Reason for consult:  Discharge Planning                Permission sought to share information with:    Permission granted to share information::     Name::        Agency::     Relationship::     Contact Information:     Housing/Transportation Living arrangements for the past 2 months:  Assisted Living Facility Source of Information:  Adult Children, Facility Patient Interpreter Needed:  None Criminal Activity/Legal Involvement Pertinent to Current Situation/Hospitalization:  No - Comment as needed Significant Relationships:  Adult Children Lives with:  Facility Resident Do you feel safe going back to the place where you live?  Yes Need for family participation in patient care:  Yes (Comment)  Care giving concerns:  None reported. Pt is long term resident at ALF.    Social Worker assessment / plan:  CSW attempted to meet with pt at bedside, but pt sleeping soundly and is oriented to self only per chart. CSW spoke with pt's son, Jorja Loaim on phone. He reports [facility]. She had been back and forth between SNF and independent living for a year or more before that. Tim indicates that pt has had a difficult time accepting that she cannot live alone in her apartment again. He said that yesterday she admitted that this was not feasible and that she needed to return to [facility]. Per Alcario DroughtErica at facility, pt is fairly independent at baseline. She requires assist mainly due to poor vision for dressing, bathing, and toileting. Pt ambulates independently, but occasionally with a walker. No home health prior to admission and okay to return.   Employment status:  Retired Database administratornsurance information:  Managed Medicare PT Recommendations:  Not assessed at this time Information / Referral to community resources:   Other (Comment Required) ([facility])  Patient/Family's Response to care:  Pt's son [facility] when stable. He indicates they were told to anticipate d/c today.   Patient/Family's Understanding of and Emotional Response to Diagnosis, Current Treatment, and Prognosis:  Pt's son appears to be aware of pt's medical history. He states he is working today, but is able to accept calls if needed.   Emotional Assessment Appearance:  Appears stated age Attitude/Demeanor/Rapport:  Unable to Assess Affect (typically observed):  Unable to Assess Orientation:  Oriented to Self Alcohol / Substance use:  Not Applicable Psych involvement (Current and /or in the community):  No (Comment)  Discharge Needs  Concerns to be addressed:  Discharge Planning Concerns Readmission within the last 30 days:  No Current discharge risk:  None Barriers to Discharge:  Continued Medical Work up   Karn CassisStultz, Kendal Raffo Shan[place]ger, LCSW 09/17/2016, 9:31 AM [phone]

## 2016-09-17 NOTE — Care Management Note (Signed)
Case Management Note  Patient Details  Name: Cynthia [name] MRN: [id number] Date of Birth: [birth year][name]     Expected Discharge Date:       09/17/2016           Expected Discharge Plan:  Skilled Nursing Facility  In-House Referral:  Clinical Social Work  Discharge planning Services  CM Consult  Post Acute Care Choice:  NA Choice offered to:  NA  DME Arranged:    DME Agency:     HH Arranged:    HH Agency:     Status of Service:  Completed, signed off  If discussed at [employer] Meetings, dates discussed:    Additional Comments: Patient is from [facility]. Plan is for her to return there at discharge. CSW aware and working on arrangements.   [name], Chrystine OilerSharley Diane, RN 09/17/2016, 2:24 PM

## 2016-09-17 NOTE — Clinical Social Work Note (Signed)
[facility]. Pt's son and facility aware and agreeable. Son is sending family to pick up pt. Facility reports no need for FL2.   Derenda FennelKara Dontavia Brand, LCSW [phone]

## 2016-09-18 LAB — HEMOGLOBIN A1C
Hgb A1c MFr Bld: 5.6 % (ref 4.8–5.6)
MEAN PLASMA GLUCOSE: 114 mg/dL

## 2016-09-18 LAB — URINE CULTURE

## 2016-09-18 LAB — RPR: RPR: NONREACTIVE

## 2016-09-18 LAB — ANGIOTENSIN CONVERTING ENZYME: Angiotensin-Converting Enzyme: 66 U/L (ref 14–82)

## 2016-09-20 DIAGNOSIS — Z7689 Persons encountering health services in other specified circumstances: Secondary | ICD-10-CM | POA: Diagnosis not present

## 2016-09-20 LAB — HOMOCYSTEINE: HOMOCYSTEINE-NORM: 20.3 umol/L — AB (ref 0.0–15.0)

## 2016-09-22 DIAGNOSIS — Z792 Long term (current) use of antibiotics: Secondary | ICD-10-CM | POA: Diagnosis not present

## 2016-09-22 DIAGNOSIS — Z950 Presence of cardiac pacemaker: Secondary | ICD-10-CM | POA: Diagnosis not present

## 2016-09-22 DIAGNOSIS — G309 Alzheimer's disease, unspecified: Secondary | ICD-10-CM | POA: Diagnosis not present

## 2016-09-22 DIAGNOSIS — I110 Hypertensive heart disease with heart failure: Secondary | ICD-10-CM | POA: Diagnosis not present

## 2016-09-22 DIAGNOSIS — I509 Heart failure, unspecified: Secondary | ICD-10-CM | POA: Diagnosis not present

## 2016-09-22 DIAGNOSIS — H4901 Third [oculomotor] nerve palsy, right eye: Secondary | ICD-10-CM | POA: Diagnosis not present

## 2016-09-22 DIAGNOSIS — I69398 Other sequelae of cerebral infarction: Secondary | ICD-10-CM | POA: Diagnosis not present

## 2016-09-22 DIAGNOSIS — Z7902 Long term (current) use of antithrombotics/antiplatelets: Secondary | ICD-10-CM | POA: Diagnosis not present

## 2016-09-22 DIAGNOSIS — E039 Hypothyroidism, unspecified: Secondary | ICD-10-CM | POA: Diagnosis not present

## 2016-09-22 DIAGNOSIS — K219 Gastro-esophageal reflux disease without esophagitis: Secondary | ICD-10-CM | POA: Diagnosis not present

## 2016-09-23 DIAGNOSIS — I509 Heart failure, unspecified: Secondary | ICD-10-CM | POA: Diagnosis not present

## 2016-09-23 DIAGNOSIS — K219 Gastro-esophageal reflux disease without esophagitis: Secondary | ICD-10-CM | POA: Diagnosis not present

## 2016-09-23 DIAGNOSIS — E039 Hypothyroidism, unspecified: Secondary | ICD-10-CM | POA: Diagnosis not present

## 2016-09-23 DIAGNOSIS — Z792 Long term (current) use of antibiotics: Secondary | ICD-10-CM | POA: Diagnosis not present

## 2016-09-23 DIAGNOSIS — G309 Alzheimer's disease, unspecified: Secondary | ICD-10-CM | POA: Diagnosis not present

## 2016-09-23 DIAGNOSIS — Z7902 Long term (current) use of antithrombotics/antiplatelets: Secondary | ICD-10-CM | POA: Diagnosis not present

## 2016-09-23 DIAGNOSIS — I69398 Other sequelae of cerebral infarction: Secondary | ICD-10-CM | POA: Diagnosis not present

## 2016-09-23 DIAGNOSIS — Z950 Presence of cardiac pacemaker: Secondary | ICD-10-CM | POA: Diagnosis not present

## 2016-09-23 DIAGNOSIS — H4901 Third [oculomotor] nerve palsy, right eye: Secondary | ICD-10-CM | POA: Diagnosis not present

## 2016-09-23 DIAGNOSIS — I110 Hypertensive heart disease with heart failure: Secondary | ICD-10-CM | POA: Diagnosis not present

## 2016-09-24 DIAGNOSIS — Z950 Presence of cardiac pacemaker: Secondary | ICD-10-CM | POA: Diagnosis not present

## 2016-09-24 DIAGNOSIS — I69398 Other sequelae of cerebral infarction: Secondary | ICD-10-CM | POA: Diagnosis not present

## 2016-09-24 DIAGNOSIS — G309 Alzheimer's disease, unspecified: Secondary | ICD-10-CM | POA: Diagnosis not present

## 2016-09-24 DIAGNOSIS — E039 Hypothyroidism, unspecified: Secondary | ICD-10-CM | POA: Diagnosis not present

## 2016-09-24 DIAGNOSIS — K219 Gastro-esophageal reflux disease without esophagitis: Secondary | ICD-10-CM | POA: Diagnosis not present

## 2016-09-24 DIAGNOSIS — H4901 Third [oculomotor] nerve palsy, right eye: Secondary | ICD-10-CM | POA: Diagnosis not present

## 2016-09-24 DIAGNOSIS — Z792 Long term (current) use of antibiotics: Secondary | ICD-10-CM | POA: Diagnosis not present

## 2016-09-24 DIAGNOSIS — I509 Heart failure, unspecified: Secondary | ICD-10-CM | POA: Diagnosis not present

## 2016-09-24 DIAGNOSIS — I110 Hypertensive heart disease with heart failure: Secondary | ICD-10-CM | POA: Diagnosis not present

## 2016-09-24 DIAGNOSIS — Z7902 Long term (current) use of antithrombotics/antiplatelets: Secondary | ICD-10-CM | POA: Diagnosis not present

## 2016-09-27 DIAGNOSIS — E039 Hypothyroidism, unspecified: Secondary | ICD-10-CM | POA: Diagnosis not present

## 2016-09-27 DIAGNOSIS — I509 Heart failure, unspecified: Secondary | ICD-10-CM | POA: Diagnosis not present

## 2016-09-27 DIAGNOSIS — I110 Hypertensive heart disease with heart failure: Secondary | ICD-10-CM | POA: Diagnosis not present

## 2016-09-27 DIAGNOSIS — Z950 Presence of cardiac pacemaker: Secondary | ICD-10-CM | POA: Diagnosis not present

## 2016-09-27 DIAGNOSIS — G309 Alzheimer's disease, unspecified: Secondary | ICD-10-CM | POA: Diagnosis not present

## 2016-09-27 DIAGNOSIS — I69398 Other sequelae of cerebral infarction: Secondary | ICD-10-CM | POA: Diagnosis not present

## 2016-09-27 DIAGNOSIS — K219 Gastro-esophageal reflux disease without esophagitis: Secondary | ICD-10-CM | POA: Diagnosis not present

## 2016-09-27 DIAGNOSIS — Z7902 Long term (current) use of antithrombotics/antiplatelets: Secondary | ICD-10-CM | POA: Diagnosis not present

## 2016-09-27 DIAGNOSIS — H4901 Third [oculomotor] nerve palsy, right eye: Secondary | ICD-10-CM | POA: Diagnosis not present

## 2016-09-27 DIAGNOSIS — Z792 Long term (current) use of antibiotics: Secondary | ICD-10-CM | POA: Diagnosis not present

## 2016-09-29 DIAGNOSIS — H4901 Third [oculomotor] nerve palsy, right eye: Secondary | ICD-10-CM | POA: Diagnosis not present

## 2016-09-29 DIAGNOSIS — Z792 Long term (current) use of antibiotics: Secondary | ICD-10-CM | POA: Diagnosis not present

## 2016-09-29 DIAGNOSIS — Z950 Presence of cardiac pacemaker: Secondary | ICD-10-CM | POA: Diagnosis not present

## 2016-09-29 DIAGNOSIS — K219 Gastro-esophageal reflux disease without esophagitis: Secondary | ICD-10-CM | POA: Diagnosis not present

## 2016-09-29 DIAGNOSIS — G309 Alzheimer's disease, unspecified: Secondary | ICD-10-CM | POA: Diagnosis not present

## 2016-09-29 DIAGNOSIS — I69398 Other sequelae of cerebral infarction: Secondary | ICD-10-CM | POA: Diagnosis not present

## 2016-09-29 DIAGNOSIS — I110 Hypertensive heart disease with heart failure: Secondary | ICD-10-CM | POA: Diagnosis not present

## 2016-09-29 DIAGNOSIS — I509 Heart failure, unspecified: Secondary | ICD-10-CM | POA: Diagnosis not present

## 2016-09-29 DIAGNOSIS — Z7902 Long term (current) use of antithrombotics/antiplatelets: Secondary | ICD-10-CM | POA: Diagnosis not present

## 2016-09-29 DIAGNOSIS — E039 Hypothyroidism, unspecified: Secondary | ICD-10-CM | POA: Diagnosis not present

## 2016-09-30 DIAGNOSIS — H4901 Third [oculomotor] nerve palsy, right eye: Secondary | ICD-10-CM | POA: Diagnosis not present

## 2016-09-30 DIAGNOSIS — I69398 Other sequelae of cerebral infarction: Secondary | ICD-10-CM | POA: Diagnosis not present

## 2016-09-30 DIAGNOSIS — I509 Heart failure, unspecified: Secondary | ICD-10-CM | POA: Diagnosis not present

## 2016-09-30 DIAGNOSIS — Z950 Presence of cardiac pacemaker: Secondary | ICD-10-CM | POA: Diagnosis not present

## 2016-09-30 DIAGNOSIS — K219 Gastro-esophageal reflux disease without esophagitis: Secondary | ICD-10-CM | POA: Diagnosis not present

## 2016-09-30 DIAGNOSIS — G309 Alzheimer's disease, unspecified: Secondary | ICD-10-CM | POA: Diagnosis not present

## 2016-09-30 DIAGNOSIS — Z7902 Long term (current) use of antithrombotics/antiplatelets: Secondary | ICD-10-CM | POA: Diagnosis not present

## 2016-09-30 DIAGNOSIS — E039 Hypothyroidism, unspecified: Secondary | ICD-10-CM | POA: Diagnosis not present

## 2016-09-30 DIAGNOSIS — Z792 Long term (current) use of antibiotics: Secondary | ICD-10-CM | POA: Diagnosis not present

## 2016-09-30 DIAGNOSIS — I110 Hypertensive heart disease with heart failure: Secondary | ICD-10-CM | POA: Diagnosis not present

## 2016-10-01 DIAGNOSIS — I110 Hypertensive heart disease with heart failure: Secondary | ICD-10-CM | POA: Diagnosis not present

## 2016-10-01 DIAGNOSIS — I509 Heart failure, unspecified: Secondary | ICD-10-CM | POA: Diagnosis not present

## 2016-10-01 DIAGNOSIS — Z792 Long term (current) use of antibiotics: Secondary | ICD-10-CM | POA: Diagnosis not present

## 2016-10-01 DIAGNOSIS — I69398 Other sequelae of cerebral infarction: Secondary | ICD-10-CM | POA: Diagnosis not present

## 2016-10-01 DIAGNOSIS — G309 Alzheimer's disease, unspecified: Secondary | ICD-10-CM | POA: Diagnosis not present

## 2016-10-01 DIAGNOSIS — H4901 Third [oculomotor] nerve palsy, right eye: Secondary | ICD-10-CM | POA: Diagnosis not present

## 2016-10-01 DIAGNOSIS — Z950 Presence of cardiac pacemaker: Secondary | ICD-10-CM | POA: Diagnosis not present

## 2016-10-01 DIAGNOSIS — E039 Hypothyroidism, unspecified: Secondary | ICD-10-CM | POA: Diagnosis not present

## 2016-10-01 DIAGNOSIS — Z7902 Long term (current) use of antithrombotics/antiplatelets: Secondary | ICD-10-CM | POA: Diagnosis not present

## 2016-10-01 DIAGNOSIS — K219 Gastro-esophageal reflux disease without esophagitis: Secondary | ICD-10-CM | POA: Diagnosis not present

## 2016-10-04 DIAGNOSIS — I110 Hypertensive heart disease with heart failure: Secondary | ICD-10-CM | POA: Diagnosis not present

## 2016-10-04 DIAGNOSIS — Z7902 Long term (current) use of antithrombotics/antiplatelets: Secondary | ICD-10-CM | POA: Diagnosis not present

## 2016-10-04 DIAGNOSIS — E039 Hypothyroidism, unspecified: Secondary | ICD-10-CM | POA: Diagnosis not present

## 2016-10-04 DIAGNOSIS — I69398 Other sequelae of cerebral infarction: Secondary | ICD-10-CM | POA: Diagnosis not present

## 2016-10-04 DIAGNOSIS — Z950 Presence of cardiac pacemaker: Secondary | ICD-10-CM | POA: Diagnosis not present

## 2016-10-04 DIAGNOSIS — G309 Alzheimer's disease, unspecified: Secondary | ICD-10-CM | POA: Diagnosis not present

## 2016-10-04 DIAGNOSIS — Z792 Long term (current) use of antibiotics: Secondary | ICD-10-CM | POA: Diagnosis not present

## 2016-10-04 DIAGNOSIS — K219 Gastro-esophageal reflux disease without esophagitis: Secondary | ICD-10-CM | POA: Diagnosis not present

## 2016-10-04 DIAGNOSIS — H4901 Third [oculomotor] nerve palsy, right eye: Secondary | ICD-10-CM | POA: Diagnosis not present

## 2016-10-04 DIAGNOSIS — I509 Heart failure, unspecified: Secondary | ICD-10-CM | POA: Diagnosis not present

## 2016-10-05 DIAGNOSIS — K219 Gastro-esophageal reflux disease without esophagitis: Secondary | ICD-10-CM | POA: Diagnosis not present

## 2016-10-05 DIAGNOSIS — Z950 Presence of cardiac pacemaker: Secondary | ICD-10-CM | POA: Diagnosis not present

## 2016-10-05 DIAGNOSIS — Z792 Long term (current) use of antibiotics: Secondary | ICD-10-CM | POA: Diagnosis not present

## 2016-10-05 DIAGNOSIS — H4901 Third [oculomotor] nerve palsy, right eye: Secondary | ICD-10-CM | POA: Diagnosis not present

## 2016-10-05 DIAGNOSIS — I110 Hypertensive heart disease with heart failure: Secondary | ICD-10-CM | POA: Diagnosis not present

## 2016-10-05 DIAGNOSIS — E039 Hypothyroidism, unspecified: Secondary | ICD-10-CM | POA: Diagnosis not present

## 2016-10-05 DIAGNOSIS — I69398 Other sequelae of cerebral infarction: Secondary | ICD-10-CM | POA: Diagnosis not present

## 2016-10-05 DIAGNOSIS — G309 Alzheimer's disease, unspecified: Secondary | ICD-10-CM | POA: Diagnosis not present

## 2016-10-05 DIAGNOSIS — Z7902 Long term (current) use of antithrombotics/antiplatelets: Secondary | ICD-10-CM | POA: Diagnosis not present

## 2016-10-05 DIAGNOSIS — I509 Heart failure, unspecified: Secondary | ICD-10-CM | POA: Diagnosis not present

## 2016-10-06 DIAGNOSIS — I69398 Other sequelae of cerebral infarction: Secondary | ICD-10-CM | POA: Diagnosis not present

## 2016-10-06 DIAGNOSIS — H4901 Third [oculomotor] nerve palsy, right eye: Secondary | ICD-10-CM | POA: Diagnosis not present

## 2016-10-06 DIAGNOSIS — I509 Heart failure, unspecified: Secondary | ICD-10-CM | POA: Diagnosis not present

## 2016-10-06 DIAGNOSIS — Z7902 Long term (current) use of antithrombotics/antiplatelets: Secondary | ICD-10-CM | POA: Diagnosis not present

## 2016-10-06 DIAGNOSIS — G309 Alzheimer's disease, unspecified: Secondary | ICD-10-CM | POA: Diagnosis not present

## 2016-10-06 DIAGNOSIS — Z792 Long term (current) use of antibiotics: Secondary | ICD-10-CM | POA: Diagnosis not present

## 2016-10-06 DIAGNOSIS — I110 Hypertensive heart disease with heart failure: Secondary | ICD-10-CM | POA: Diagnosis not present

## 2016-10-06 DIAGNOSIS — Z950 Presence of cardiac pacemaker: Secondary | ICD-10-CM | POA: Diagnosis not present

## 2016-10-06 DIAGNOSIS — E039 Hypothyroidism, unspecified: Secondary | ICD-10-CM | POA: Diagnosis not present

## 2016-10-06 DIAGNOSIS — K219 Gastro-esophageal reflux disease without esophagitis: Secondary | ICD-10-CM | POA: Diagnosis not present

## 2016-10-07 DIAGNOSIS — Z7902 Long term (current) use of antithrombotics/antiplatelets: Secondary | ICD-10-CM | POA: Diagnosis not present

## 2016-10-07 DIAGNOSIS — Z950 Presence of cardiac pacemaker: Secondary | ICD-10-CM | POA: Diagnosis not present

## 2016-10-07 DIAGNOSIS — G309 Alzheimer's disease, unspecified: Secondary | ICD-10-CM | POA: Diagnosis not present

## 2016-10-07 DIAGNOSIS — E039 Hypothyroidism, unspecified: Secondary | ICD-10-CM | POA: Diagnosis not present

## 2016-10-07 DIAGNOSIS — K219 Gastro-esophageal reflux disease without esophagitis: Secondary | ICD-10-CM | POA: Diagnosis not present

## 2016-10-07 DIAGNOSIS — H4901 Third [oculomotor] nerve palsy, right eye: Secondary | ICD-10-CM | POA: Diagnosis not present

## 2016-10-07 DIAGNOSIS — I509 Heart failure, unspecified: Secondary | ICD-10-CM | POA: Diagnosis not present

## 2016-10-07 DIAGNOSIS — I69398 Other sequelae of cerebral infarction: Secondary | ICD-10-CM | POA: Diagnosis not present

## 2016-10-07 DIAGNOSIS — Z792 Long term (current) use of antibiotics: Secondary | ICD-10-CM | POA: Diagnosis not present

## 2016-10-07 DIAGNOSIS — I110 Hypertensive heart disease with heart failure: Secondary | ICD-10-CM | POA: Diagnosis not present

## 2016-10-08 DIAGNOSIS — Z792 Long term (current) use of antibiotics: Secondary | ICD-10-CM | POA: Diagnosis not present

## 2016-10-08 DIAGNOSIS — E039 Hypothyroidism, unspecified: Secondary | ICD-10-CM | POA: Diagnosis not present

## 2016-10-08 DIAGNOSIS — I509 Heart failure, unspecified: Secondary | ICD-10-CM | POA: Diagnosis not present

## 2016-10-08 DIAGNOSIS — I110 Hypertensive heart disease with heart failure: Secondary | ICD-10-CM | POA: Diagnosis not present

## 2016-10-08 DIAGNOSIS — I69398 Other sequelae of cerebral infarction: Secondary | ICD-10-CM | POA: Diagnosis not present

## 2016-10-08 DIAGNOSIS — Z950 Presence of cardiac pacemaker: Secondary | ICD-10-CM | POA: Diagnosis not present

## 2016-10-08 DIAGNOSIS — Z7902 Long term (current) use of antithrombotics/antiplatelets: Secondary | ICD-10-CM | POA: Diagnosis not present

## 2016-10-08 DIAGNOSIS — G309 Alzheimer's disease, unspecified: Secondary | ICD-10-CM | POA: Diagnosis not present

## 2016-10-08 DIAGNOSIS — K219 Gastro-esophageal reflux disease without esophagitis: Secondary | ICD-10-CM | POA: Diagnosis not present

## 2016-10-08 DIAGNOSIS — H4901 Third [oculomotor] nerve palsy, right eye: Secondary | ICD-10-CM | POA: Diagnosis not present

## 2016-10-11 DIAGNOSIS — I509 Heart failure, unspecified: Secondary | ICD-10-CM | POA: Diagnosis not present

## 2016-10-11 DIAGNOSIS — E039 Hypothyroidism, unspecified: Secondary | ICD-10-CM | POA: Diagnosis not present

## 2016-10-11 DIAGNOSIS — H4901 Third [oculomotor] nerve palsy, right eye: Secondary | ICD-10-CM | POA: Diagnosis not present

## 2016-10-11 DIAGNOSIS — Z950 Presence of cardiac pacemaker: Secondary | ICD-10-CM | POA: Diagnosis not present

## 2016-10-11 DIAGNOSIS — Z792 Long term (current) use of antibiotics: Secondary | ICD-10-CM | POA: Diagnosis not present

## 2016-10-11 DIAGNOSIS — K219 Gastro-esophageal reflux disease without esophagitis: Secondary | ICD-10-CM | POA: Diagnosis not present

## 2016-10-11 DIAGNOSIS — Z7902 Long term (current) use of antithrombotics/antiplatelets: Secondary | ICD-10-CM | POA: Diagnosis not present

## 2016-10-11 DIAGNOSIS — G309 Alzheimer's disease, unspecified: Secondary | ICD-10-CM | POA: Diagnosis not present

## 2016-10-11 DIAGNOSIS — I110 Hypertensive heart disease with heart failure: Secondary | ICD-10-CM | POA: Diagnosis not present

## 2016-10-11 DIAGNOSIS — I69398 Other sequelae of cerebral infarction: Secondary | ICD-10-CM | POA: Diagnosis not present

## 2016-10-13 DIAGNOSIS — H532 Diplopia: Secondary | ICD-10-CM | POA: Diagnosis not present

## 2016-10-14 DIAGNOSIS — Z7902 Long term (current) use of antithrombotics/antiplatelets: Secondary | ICD-10-CM | POA: Diagnosis not present

## 2016-10-14 DIAGNOSIS — Z792 Long term (current) use of antibiotics: Secondary | ICD-10-CM | POA: Diagnosis not present

## 2016-10-14 DIAGNOSIS — I110 Hypertensive heart disease with heart failure: Secondary | ICD-10-CM | POA: Diagnosis not present

## 2016-10-14 DIAGNOSIS — Z950 Presence of cardiac pacemaker: Secondary | ICD-10-CM | POA: Diagnosis not present

## 2016-10-14 DIAGNOSIS — K219 Gastro-esophageal reflux disease without esophagitis: Secondary | ICD-10-CM | POA: Diagnosis not present

## 2016-10-14 DIAGNOSIS — I69398 Other sequelae of cerebral infarction: Secondary | ICD-10-CM | POA: Diagnosis not present

## 2016-10-14 DIAGNOSIS — G309 Alzheimer's disease, unspecified: Secondary | ICD-10-CM | POA: Diagnosis not present

## 2016-10-14 DIAGNOSIS — E039 Hypothyroidism, unspecified: Secondary | ICD-10-CM | POA: Diagnosis not present

## 2016-10-14 DIAGNOSIS — H4901 Third [oculomotor] nerve palsy, right eye: Secondary | ICD-10-CM | POA: Diagnosis not present

## 2016-10-14 DIAGNOSIS — I509 Heart failure, unspecified: Secondary | ICD-10-CM | POA: Diagnosis not present

## 2016-10-20 DIAGNOSIS — Z950 Presence of cardiac pacemaker: Secondary | ICD-10-CM | POA: Diagnosis not present

## 2016-10-20 DIAGNOSIS — Z7902 Long term (current) use of antithrombotics/antiplatelets: Secondary | ICD-10-CM | POA: Diagnosis not present

## 2016-10-20 DIAGNOSIS — K219 Gastro-esophageal reflux disease without esophagitis: Secondary | ICD-10-CM | POA: Diagnosis not present

## 2016-10-20 DIAGNOSIS — H4901 Third [oculomotor] nerve palsy, right eye: Secondary | ICD-10-CM | POA: Diagnosis not present

## 2016-10-20 DIAGNOSIS — I509 Heart failure, unspecified: Secondary | ICD-10-CM | POA: Diagnosis not present

## 2016-10-20 DIAGNOSIS — I110 Hypertensive heart disease with heart failure: Secondary | ICD-10-CM | POA: Diagnosis not present

## 2016-10-20 DIAGNOSIS — I69398 Other sequelae of cerebral infarction: Secondary | ICD-10-CM | POA: Diagnosis not present

## 2016-10-20 DIAGNOSIS — E039 Hypothyroidism, unspecified: Secondary | ICD-10-CM | POA: Diagnosis not present

## 2016-10-20 DIAGNOSIS — G309 Alzheimer's disease, unspecified: Secondary | ICD-10-CM | POA: Diagnosis not present

## 2016-10-20 DIAGNOSIS — Z792 Long term (current) use of antibiotics: Secondary | ICD-10-CM | POA: Diagnosis not present

## 2016-10-22 DIAGNOSIS — K219 Gastro-esophageal reflux disease without esophagitis: Secondary | ICD-10-CM | POA: Diagnosis not present

## 2016-10-22 DIAGNOSIS — G309 Alzheimer's disease, unspecified: Secondary | ICD-10-CM | POA: Diagnosis not present

## 2016-10-22 DIAGNOSIS — H4901 Third [oculomotor] nerve palsy, right eye: Secondary | ICD-10-CM | POA: Diagnosis not present

## 2016-10-22 DIAGNOSIS — Z950 Presence of cardiac pacemaker: Secondary | ICD-10-CM | POA: Diagnosis not present

## 2016-10-22 DIAGNOSIS — I69398 Other sequelae of cerebral infarction: Secondary | ICD-10-CM | POA: Diagnosis not present

## 2016-10-22 DIAGNOSIS — E039 Hypothyroidism, unspecified: Secondary | ICD-10-CM | POA: Diagnosis not present

## 2016-10-22 DIAGNOSIS — I110 Hypertensive heart disease with heart failure: Secondary | ICD-10-CM | POA: Diagnosis not present

## 2016-10-22 DIAGNOSIS — I509 Heart failure, unspecified: Secondary | ICD-10-CM | POA: Diagnosis not present

## 2016-10-22 DIAGNOSIS — Z7902 Long term (current) use of antithrombotics/antiplatelets: Secondary | ICD-10-CM | POA: Diagnosis not present

## 2016-10-22 DIAGNOSIS — Z792 Long term (current) use of antibiotics: Secondary | ICD-10-CM | POA: Diagnosis not present

## 2016-10-27 DIAGNOSIS — I110 Hypertensive heart disease with heart failure: Secondary | ICD-10-CM | POA: Diagnosis not present

## 2016-10-27 DIAGNOSIS — Z7902 Long term (current) use of antithrombotics/antiplatelets: Secondary | ICD-10-CM | POA: Diagnosis not present

## 2016-10-27 DIAGNOSIS — Z950 Presence of cardiac pacemaker: Secondary | ICD-10-CM | POA: Diagnosis not present

## 2016-10-27 DIAGNOSIS — Z792 Long term (current) use of antibiotics: Secondary | ICD-10-CM | POA: Diagnosis not present

## 2016-10-27 DIAGNOSIS — I69398 Other sequelae of cerebral infarction: Secondary | ICD-10-CM | POA: Diagnosis not present

## 2016-10-27 DIAGNOSIS — H4901 Third [oculomotor] nerve palsy, right eye: Secondary | ICD-10-CM | POA: Diagnosis not present

## 2016-10-27 DIAGNOSIS — K219 Gastro-esophageal reflux disease without esophagitis: Secondary | ICD-10-CM | POA: Diagnosis not present

## 2016-10-27 DIAGNOSIS — G309 Alzheimer's disease, unspecified: Secondary | ICD-10-CM | POA: Diagnosis not present

## 2016-10-27 DIAGNOSIS — E039 Hypothyroidism, unspecified: Secondary | ICD-10-CM | POA: Diagnosis not present

## 2016-10-27 DIAGNOSIS — I509 Heart failure, unspecified: Secondary | ICD-10-CM | POA: Diagnosis not present

## 2016-10-29 DIAGNOSIS — K219 Gastro-esophageal reflux disease without esophagitis: Secondary | ICD-10-CM | POA: Diagnosis not present

## 2016-10-29 DIAGNOSIS — I110 Hypertensive heart disease with heart failure: Secondary | ICD-10-CM | POA: Diagnosis not present

## 2016-10-29 DIAGNOSIS — Z792 Long term (current) use of antibiotics: Secondary | ICD-10-CM | POA: Diagnosis not present

## 2016-10-29 DIAGNOSIS — E039 Hypothyroidism, unspecified: Secondary | ICD-10-CM | POA: Diagnosis not present

## 2016-10-29 DIAGNOSIS — I509 Heart failure, unspecified: Secondary | ICD-10-CM | POA: Diagnosis not present

## 2016-10-29 DIAGNOSIS — Z950 Presence of cardiac pacemaker: Secondary | ICD-10-CM | POA: Diagnosis not present

## 2016-10-29 DIAGNOSIS — G309 Alzheimer's disease, unspecified: Secondary | ICD-10-CM | POA: Diagnosis not present

## 2016-10-29 DIAGNOSIS — Z7902 Long term (current) use of antithrombotics/antiplatelets: Secondary | ICD-10-CM | POA: Diagnosis not present

## 2016-10-29 DIAGNOSIS — H4901 Third [oculomotor] nerve palsy, right eye: Secondary | ICD-10-CM | POA: Diagnosis not present

## 2016-10-29 DIAGNOSIS — I69398 Other sequelae of cerebral infarction: Secondary | ICD-10-CM | POA: Diagnosis not present

## 2016-11-01 DIAGNOSIS — E039 Hypothyroidism, unspecified: Secondary | ICD-10-CM | POA: Diagnosis not present

## 2016-11-01 DIAGNOSIS — H4901 Third [oculomotor] nerve palsy, right eye: Secondary | ICD-10-CM | POA: Diagnosis not present

## 2016-11-01 DIAGNOSIS — Z950 Presence of cardiac pacemaker: Secondary | ICD-10-CM | POA: Diagnosis not present

## 2016-11-01 DIAGNOSIS — I110 Hypertensive heart disease with heart failure: Secondary | ICD-10-CM | POA: Diagnosis not present

## 2016-11-01 DIAGNOSIS — K219 Gastro-esophageal reflux disease without esophagitis: Secondary | ICD-10-CM | POA: Diagnosis not present

## 2016-11-01 DIAGNOSIS — Z792 Long term (current) use of antibiotics: Secondary | ICD-10-CM | POA: Diagnosis not present

## 2016-11-01 DIAGNOSIS — G309 Alzheimer's disease, unspecified: Secondary | ICD-10-CM | POA: Diagnosis not present

## 2016-11-01 DIAGNOSIS — I509 Heart failure, unspecified: Secondary | ICD-10-CM | POA: Diagnosis not present

## 2016-11-01 DIAGNOSIS — I69398 Other sequelae of cerebral infarction: Secondary | ICD-10-CM | POA: Diagnosis not present

## 2016-11-01 DIAGNOSIS — Z7902 Long term (current) use of antithrombotics/antiplatelets: Secondary | ICD-10-CM | POA: Diagnosis not present

## 2016-11-02 ENCOUNTER — Other Ambulatory Visit (HOSPITAL_COMMUNITY): Payer: Self-pay | Admitting: General Practice

## 2016-11-02 DIAGNOSIS — I69391 Dysphagia following cerebral infarction: Secondary | ICD-10-CM | POA: Diagnosis not present

## 2016-11-02 DIAGNOSIS — K219 Gastro-esophageal reflux disease without esophagitis: Secondary | ICD-10-CM | POA: Diagnosis not present

## 2016-11-02 DIAGNOSIS — Z7689 Persons encountering health services in other specified circumstances: Secondary | ICD-10-CM | POA: Diagnosis not present

## 2016-11-02 DIAGNOSIS — Z6824 Body mass index (BMI) 24.0-24.9, adult: Secondary | ICD-10-CM | POA: Diagnosis not present

## 2016-11-02 DIAGNOSIS — R1310 Dysphagia, unspecified: Secondary | ICD-10-CM

## 2016-11-04 DIAGNOSIS — H4901 Third [oculomotor] nerve palsy, right eye: Secondary | ICD-10-CM | POA: Diagnosis not present

## 2016-11-04 DIAGNOSIS — I509 Heart failure, unspecified: Secondary | ICD-10-CM | POA: Diagnosis not present

## 2016-11-04 DIAGNOSIS — Z7902 Long term (current) use of antithrombotics/antiplatelets: Secondary | ICD-10-CM | POA: Diagnosis not present

## 2016-11-04 DIAGNOSIS — I69398 Other sequelae of cerebral infarction: Secondary | ICD-10-CM | POA: Diagnosis not present

## 2016-11-04 DIAGNOSIS — E039 Hypothyroidism, unspecified: Secondary | ICD-10-CM | POA: Diagnosis not present

## 2016-11-04 DIAGNOSIS — Z950 Presence of cardiac pacemaker: Secondary | ICD-10-CM | POA: Diagnosis not present

## 2016-11-04 DIAGNOSIS — Z792 Long term (current) use of antibiotics: Secondary | ICD-10-CM | POA: Diagnosis not present

## 2016-11-04 DIAGNOSIS — G309 Alzheimer's disease, unspecified: Secondary | ICD-10-CM | POA: Diagnosis not present

## 2016-11-04 DIAGNOSIS — K219 Gastro-esophageal reflux disease without esophagitis: Secondary | ICD-10-CM | POA: Diagnosis not present

## 2016-11-04 DIAGNOSIS — I110 Hypertensive heart disease with heart failure: Secondary | ICD-10-CM | POA: Diagnosis not present

## 2016-11-11 ENCOUNTER — Ambulatory Visit (HOSPITAL_COMMUNITY)
Admission: RE | Admit: 2016-11-11 | Discharge: 2016-11-11 | Disposition: A | Discharge status: Home / Self Care | Payer: Medicare Other | Source: Ambulatory Visit | Attending: General Practice | Admitting: General Practice

## 2016-11-11 DIAGNOSIS — K449 Diaphragmatic hernia without obstruction or gangrene: Secondary | ICD-10-CM | POA: Diagnosis not present

## 2016-11-11 DIAGNOSIS — K224 Dyskinesia of esophagus: Secondary | ICD-10-CM | POA: Insufficient documentation

## 2016-11-11 DIAGNOSIS — I69391 Dysphagia following cerebral infarction: Secondary | ICD-10-CM | POA: Insufficient documentation

## 2016-11-11 DIAGNOSIS — R1310 Dysphagia, unspecified: Secondary | ICD-10-CM

## 2016-11-12 DIAGNOSIS — Z950 Presence of cardiac pacemaker: Secondary | ICD-10-CM | POA: Diagnosis not present

## 2016-11-12 DIAGNOSIS — I509 Heart failure, unspecified: Secondary | ICD-10-CM | POA: Diagnosis not present

## 2016-11-12 DIAGNOSIS — Z7902 Long term (current) use of antithrombotics/antiplatelets: Secondary | ICD-10-CM | POA: Diagnosis not present

## 2016-11-12 DIAGNOSIS — E039 Hypothyroidism, unspecified: Secondary | ICD-10-CM | POA: Diagnosis not present

## 2016-11-12 DIAGNOSIS — K219 Gastro-esophageal reflux disease without esophagitis: Secondary | ICD-10-CM | POA: Diagnosis not present

## 2016-11-12 DIAGNOSIS — H4901 Third [oculomotor] nerve palsy, right eye: Secondary | ICD-10-CM | POA: Diagnosis not present

## 2016-11-12 DIAGNOSIS — I69398 Other sequelae of cerebral infarction: Secondary | ICD-10-CM | POA: Diagnosis not present

## 2016-11-12 DIAGNOSIS — G309 Alzheimer's disease, unspecified: Secondary | ICD-10-CM | POA: Diagnosis not present

## 2016-11-12 DIAGNOSIS — I110 Hypertensive heart disease with heart failure: Secondary | ICD-10-CM | POA: Diagnosis not present

## 2016-11-12 DIAGNOSIS — Z792 Long term (current) use of antibiotics: Secondary | ICD-10-CM | POA: Diagnosis not present

## 2016-11-29 DIAGNOSIS — Z7689 Persons encountering health services in other specified circumstances: Secondary | ICD-10-CM | POA: Diagnosis not present

## 2016-12-06 DIAGNOSIS — H532 Diplopia: Secondary | ICD-10-CM | POA: Diagnosis not present

## 2016-12-14 DIAGNOSIS — E782 Mixed hyperlipidemia: Secondary | ICD-10-CM | POA: Diagnosis not present

## 2016-12-14 DIAGNOSIS — N184 Chronic kidney disease, stage 4 (severe): Secondary | ICD-10-CM | POA: Diagnosis not present

## 2016-12-14 DIAGNOSIS — I6302 Cerebral infarction due to thrombosis of basilar artery: Secondary | ICD-10-CM | POA: Diagnosis not present

## 2016-12-14 DIAGNOSIS — Z7689 Persons encountering health services in other specified circumstances: Secondary | ICD-10-CM | POA: Diagnosis not present

## 2016-12-14 DIAGNOSIS — I10 Essential (primary) hypertension: Secondary | ICD-10-CM | POA: Diagnosis not present

## 2016-12-14 DIAGNOSIS — E039 Hypothyroidism, unspecified: Secondary | ICD-10-CM | POA: Diagnosis not present

## 2016-12-14 DIAGNOSIS — G459 Transient cerebral ischemic attack, unspecified: Secondary | ICD-10-CM | POA: Diagnosis not present

## 2016-12-14 DIAGNOSIS — K219 Gastro-esophageal reflux disease without esophagitis: Secondary | ICD-10-CM | POA: Diagnosis not present

## 2016-12-22 DIAGNOSIS — N184 Chronic kidney disease, stage 4 (severe): Secondary | ICD-10-CM | POA: Diagnosis not present

## 2016-12-22 DIAGNOSIS — D860 Sarcoidosis of lung: Secondary | ICD-10-CM | POA: Diagnosis not present

## 2016-12-22 DIAGNOSIS — E039 Hypothyroidism, unspecified: Secondary | ICD-10-CM | POA: Diagnosis not present

## 2016-12-22 DIAGNOSIS — I6302 Cerebral infarction due to thrombosis of basilar artery: Secondary | ICD-10-CM | POA: Diagnosis not present

## 2016-12-22 DIAGNOSIS — E782 Mixed hyperlipidemia: Secondary | ICD-10-CM | POA: Diagnosis not present

## 2017-01-06 DIAGNOSIS — M545 Low back pain: Secondary | ICD-10-CM | POA: Diagnosis not present

## 2017-01-06 DIAGNOSIS — Z7689 Persons encountering health services in other specified circumstances: Secondary | ICD-10-CM | POA: Diagnosis not present

## 2017-01-25 DIAGNOSIS — M545 Low back pain: Secondary | ICD-10-CM | POA: Diagnosis not present

## 2017-01-25 DIAGNOSIS — Z87311 Personal history of (healed) other pathological fracture: Secondary | ICD-10-CM | POA: Diagnosis not present

## 2017-01-25 DIAGNOSIS — M47816 Spondylosis without myelopathy or radiculopathy, lumbar region: Secondary | ICD-10-CM | POA: Diagnosis not present

## 2017-01-27 DIAGNOSIS — M545 Low back pain: Secondary | ICD-10-CM | POA: Diagnosis not present

## 2017-01-28 ENCOUNTER — Ambulatory Visit (INDEPENDENT_AMBULATORY_CARE_PROVIDER_SITE_OTHER): Payer: Medicare Other | Admitting: Internal Medicine

## 2017-01-28 VITALS — BP 128/84 | HR 66 | Ht 64.0 in | Wt 143.0 lb

## 2017-01-28 DIAGNOSIS — I25119 Atherosclerotic heart disease of native coronary artery with unspecified angina pectoris: Secondary | ICD-10-CM | POA: Diagnosis not present

## 2017-01-28 DIAGNOSIS — I442 Atrioventricular block, complete: Secondary | ICD-10-CM | POA: Diagnosis not present

## 2017-01-28 DIAGNOSIS — Z950 Presence of cardiac pacemaker: Secondary | ICD-10-CM

## 2017-01-28 NOTE — Patient Instructions (Signed)
Medication Instructions:  Continue all current medications.  Labwork: none  Testing/Procedures: none  Follow-Up: Your physician wants you to follow up in:  1 year.  You will receive a reminder letter in the mail one-two months in advance.  If you don't receive a letter, please call our office to schedule the follow up appointment - Dr. Johney FrameAllred.  Your physician wants you to follow up in: 6 months.  You will receive a reminder letter in the mail one-two months in advance.  If you don't receive a letter, please call our office to schedule the follow up appointment - device clinic.  Any Other Special Instructions Will Be Listed Below (If Applicable).  If you need a refill on your cardiac medications before your next appointment, please call your pharmacy.

## 2017-01-28 NOTE — Progress Notes (Signed)
PCP:  Wendall PapaHairfield, Keavie C Primary Cardiologist:  Dr Diona BrownerMcDowell  The patient presents today for routine electrophysiology followup.  Today, she denies symptoms of palpitations, lower extremity edema, dizziness, presyncope, syncope, or neurologic sequela.  The patient feels that she is tolerating medications without difficulties and is otherwise without complaint today.  She has dementia.  She is very unhappy about being in a nursing home.  She says that they are keeping her against her will and refers to it as "jail".  Her son is with her today.  He states that she spends most of her time in bed and is not active.  She attributes this to chronic back pain.  Son states that she is frequently confused.  Past Medical History:  Diagnosis Date  . Arthritis   . Cataracts, bilateral   . Chronic diastolic heart failure (HCC)   . Complete heart block (HCC)    a. s/p Medtronic PPM  . Coronary atherosclerosis of native coronary artery    a. DES to LAD 2/05. b. DES to LAD 02/2013 for ISR.  Marland Kitchen. Depression   . GERD (gastroesophageal reflux disease)   . Glaucoma   . Hyperlipidemia   . Hypothyroidism   . Ischemic cardiomyopathy    a. EF 45-50% by cath 03/01/13.  . Sarcoidosis [facility](HCC)    Past Surgical History:  Procedure Laterality Date  . ANTERIOR CERVICAL DISCECTOMY     Cervical fusion  . cataracts     bilateral  . COLONOSCOPY    . GALLBLADDER SURGERY    . LUMBAR LAMINECTOMY     L3 S1  . PACEMAKER INSERTION  2000, 2007   Medtronic  . PERCUTANEOUS CORONARY STENT INTERVENTION (PCI-S) Left 03/01/13  . PERCUTANEOUS CORONARY STENT INTERVENTION (PCI-S) N/A 03/01/2013   Procedure: PERCUTANEOUS CORONARY STENT INTERVENTION (PCI-S);  Surgeon: Kathleene Hazelhristopher D McAlhany, MD;  [facility];  Service: Cardiovascular;  Laterality: N/A;  . UPPER GASTROINTESTINAL ENDOSCOPY      Current Outpatient Prescriptions  Medication Sig Dispense Refill  . acetaminophen (TYLENOL) 650 MG CR tablet Take 650 mg by mouth at  bedtime. *May take one tablet every 6 hours as needed for pain    . atorvastatin (LIPITOR) 20 MG tablet Take 20 mg by mouth daily.    Marland Kitchen. buPROPion (WELLBUTRIN XL) 150 MG 24 hr tablet Take 150 mg by mouth daily.    . calcium-vitamin D (OSCAL 500/200 D-3) 500-200 MG-UNIT tablet Take 1 tablet by mouth 2 (two) times daily.    . Cholecalciferol (VITAMIN D-3) 1000 UNITS CAPS Take 1 capsule by mouth daily.    . clopidogrel (PLAVIX) 75 MG tablet TAKE ONE TABLET BY MOUTH DAILY WITH BREAKFAST 30 tablet 6  . docusate sodium (COLACE) 100 MG capsule Take 200 mg by mouth daily.     Marland Kitchen. donepezil (ARICEPT) 10 MG tablet Take 10 mg by mouth at bedtime.    . dorzolamide-timolol (COSOPT) 22.3-6.8 MG/ML ophthalmic solution Place 1 drop into the left eye 2 (two) times daily.     . DULoxetine (CYMBALTA) 60 MG capsule Take 60 mg by mouth daily.    . furosemide (LASIX) 20 MG tablet Take 20 mg by mouth daily.    Marland Kitchen. HYDROcodone-acetaminophen (NORCO/VICODIN) 5-325 MG tablet Take 1 tablet by mouth every 8 (eight) hours as needed for moderate pain.    Marland Kitchen. levothyroxine (SYNTHROID, LEVOTHROID) 75 MCG tablet Take 75 mcg by mouth daily before breakfast.    . meloxicam (MOBIC) 7.5 MG tablet Take 7.5 mg by mouth  daily.    . Multiple Vitamin (MULTIVITAMIN WITH MINERALS) TABS tablet Take 1 tablet by mouth daily.    . mupirocin ointment (BACTROBAN) 2 % Place 1 application into the nose 2 (two) times daily. 22 g 0  . pantoprazole (PROTONIX) 20 MG tablet Take 20 mg by mouth daily.    . potassium chloride SA (K-DUR,KLOR-CON) 20 MEQ tablet Take 20 mEq by mouth daily.    . ranitidine (ZANTAC) 150 MG tablet Take 150 mg by mouth at bedtime.     Marland Kitchen tiZANidine (ZANAFLEX) 2 MG tablet Take 2 mg by mouth every 8 (eight) hours as needed for muscle spasms.     No current facility-administered medications for this visit.     Allergies  Allergen Reactions  . Statins     Social History   Social History  . Marital status: Divorced    Spouse  name: N/A  . Number of children: 3  . Years of education: N/A   Occupational History  . retired Retired   Social History Main Topics  . Smoking status: Never Smoker  . Smokeless tobacco: Never Used  . Alcohol use No  . Drug use: No  . Sexual activity: No   Other Topics Concern  . Not on file   Social History Narrative  . No narrative on file    Physical Exam: Vitals:   01/28/17 1124  BP: 128/84  Pulse: 66  Weight: 143 lb (64.9 kg)  Height: 5\' 4"  (1.626 m)    GEN- The patient is elderly appearing, alert but appears confused.   Head- normocephalic, atraumatic Eyes-  Sclera clear, conjunctiva pink Ears- hearing intact Oropharynx- clear Neck- supple,  Lungs- Clear to ausculation bilaterally, normal work of breathing Chest- pacemaker pocket is well healed Heart- Regular rate and rhythm, no murmurs, rubs or gallops, PMI not laterally displaced GI- soft, NT, ND, + BS Extremities- no clubbing, cyanosis, or edema  Pacemaker interrogation- reviewed in detail today,  See PACEART report  Assessment and Plan: 1. Complete heart block Normal pacemaker function Occasional atrial lead noise is still noted.   Lead function is ok and she does not a pace.  I do not plan lead revision. See Pace Art report No changes today  2. CAD Stable No change required today  Follow-up with Dr Diona Browner as scheduled  No longer doing carelink and not willing to try again presently. Will therefore return to device clinic in 6 months I will see in a year  Hillis Range MD, [facility] 01/28/2017 11:39 AM

## 2017-01-31 LAB — CUP PACEART INCLINIC DEVICE CHECK
Battery Voltage: 2.76 V
Brady Statistic AS VP Percent: 87 %
Date Time Interrogation Session: 20180202174843
Implantable Lead Implant Date: 20000908
Implantable Lead Implant Date: 20000908
Implantable Lead Location: 753859
Implantable Lead Model: 4285
Implantable Lead Serial Number: 282090
Lead Channel Impedance Value: 410 Ohm
Lead Channel Impedance Value: 945 Ohm
Lead Channel Pacing Threshold Pulse Width: 0.4 ms
Lead Channel Pacing Threshold Pulse Width: 0.4 ms
Lead Channel Pacing Threshold Pulse Width: 0.4 ms
Lead Channel Setting Sensing Sensitivity: 2.8 mV
MDC IDC LEAD LOCATION: 753860
MDC IDC MSMT BATTERY IMPEDANCE: 2389 Ohm
MDC IDC MSMT BATTERY REMAINING LONGEVITY: 26 mo
MDC IDC MSMT LEADCHNL RA PACING THRESHOLD AMPLITUDE: 0.5 V
MDC IDC MSMT LEADCHNL RA PACING THRESHOLD AMPLITUDE: 0.5 V
MDC IDC MSMT LEADCHNL RA SENSING INTR AMPL: 0.7 mV
MDC IDC MSMT LEADCHNL RV PACING THRESHOLD AMPLITUDE: 0.75 V
MDC IDC MSMT LEADCHNL RV PACING THRESHOLD AMPLITUDE: 0.875 V
MDC IDC MSMT LEADCHNL RV PACING THRESHOLD PULSEWIDTH: 0.4 ms
MDC IDC PG IMPLANT DT: 20090630
MDC IDC SET LEADCHNL RA PACING AMPLITUDE: 2 V
MDC IDC SET LEADCHNL RV PACING AMPLITUDE: 2.5 V
MDC IDC SET LEADCHNL RV PACING PULSEWIDTH: 0.4 ms
MDC IDC STAT BRADY AP VP PERCENT: 13 %
MDC IDC STAT BRADY AP VS PERCENT: 0 %
MDC IDC STAT BRADY AS VS PERCENT: 0 %

## 2017-03-01 DIAGNOSIS — R531 Weakness: Secondary | ICD-10-CM | POA: Diagnosis not present

## 2017-03-01 DIAGNOSIS — G459 Transient cerebral ischemic attack, unspecified: Secondary | ICD-10-CM | POA: Diagnosis not present

## 2017-03-04 DIAGNOSIS — Z9181 History of falling: Secondary | ICD-10-CM | POA: Diagnosis not present

## 2017-03-04 DIAGNOSIS — Z950 Presence of cardiac pacemaker: Secondary | ICD-10-CM | POA: Diagnosis not present

## 2017-03-04 DIAGNOSIS — I509 Heart failure, unspecified: Secondary | ICD-10-CM | POA: Diagnosis not present

## 2017-03-04 DIAGNOSIS — M6281 Muscle weakness (generalized): Secondary | ICD-10-CM | POA: Diagnosis not present

## 2017-03-04 DIAGNOSIS — G459 Transient cerebral ischemic attack, unspecified: Secondary | ICD-10-CM | POA: Diagnosis not present

## 2017-03-08 DIAGNOSIS — M6281 Muscle weakness (generalized): Secondary | ICD-10-CM | POA: Diagnosis not present

## 2017-03-08 DIAGNOSIS — G459 Transient cerebral ischemic attack, unspecified: Secondary | ICD-10-CM | POA: Diagnosis not present

## 2017-03-08 DIAGNOSIS — I509 Heart failure, unspecified: Secondary | ICD-10-CM | POA: Diagnosis not present

## 2017-03-08 DIAGNOSIS — Z9181 History of falling: Secondary | ICD-10-CM | POA: Diagnosis not present

## 2017-03-08 DIAGNOSIS — Z950 Presence of cardiac pacemaker: Secondary | ICD-10-CM | POA: Diagnosis not present

## 2017-03-08 NOTE — Progress Notes (Signed)
Cardiology Office Note  Date: 03/09/2017   ID: Cynthia [name], DOB June 24, 1931, MRN [id number]  PCP: Cynthia Sprung, MD  Primary Cardiologist: Cynthia Dell, MD   Chief Complaint  Patient presents with  . Coronary Artery Disease    History of Present Illness: Cynthia [name] is an 83 y.o. female last seen in August 2017. She presents with her son for a follow-up visit. Continues to reside at [facility]. She is following with Dr. Reuel [name] for primary care, saw him recently by report. She has dwindled overall in terms of functional status. She is using a rolling walker but still has high frequency of falls due to leg weakness. She has been working with physical therapy. She does not report any angina symptoms.  She continues to follow with Dr. Johney [name] in the device clinic. Medtronic pacemaker in place with history of complete heart block.  I reviewed her current medications. Cardiac regimen includes Lipitor, Plavix, Lasix, and potassium supplements.  Past Medical History:  Diagnosis Date  . Arthritis   . Cataracts, bilateral   . Chronic diastolic heart failure (HCC)   . Complete heart block (HCC)    a. s/p Medtronic PPM  . Coronary atherosclerosis of native coronary artery    a. DES to LAD 2/05. b. DES to LAD 02/2013 for ISR.  Marland Kitchen Depression   . GERD (gastroesophageal reflux disease)   . Glaucoma   . Hyperlipidemia   . Hypothyroidism   . Ischemic cardiomyopathy    a. EF 45-50% by cath 03/01/13.  . Sarcoidosis [facility])     Past Surgical History:  Procedure Laterality Date  . ANTERIOR CERVICAL DISCECTOMY     Cervical fusion  . cataracts     bilateral  . COLONOSCOPY    . GALLBLADDER SURGERY    . LUMBAR LAMINECTOMY     L3 S1  . PACEMAKER INSERTION  2000, 2007   Medtronic  . PERCUTANEOUS CORONARY STENT INTERVENTION (PCI-S) Left 03/01/13  . PERCUTANEOUS CORONARY STENT INTERVENTION (PCI-S) N/A 03/01/2013   Procedure: PERCUTANEOUS CORONARY STENT INTERVENTION (PCI-S);  Surgeon:  Cynthia Hazel, MD;  Location[facility] C[facility] LAB;  Service: Cardiovascular;  Laterality: N/A;  . UPPER GASTROINTESTINAL ENDOSCOPY      Current Outpatient Prescriptions  Medication Sig Dispense Refill  . acetaminophen (TYLENOL) 650 MG CR tablet Take 650 mg by mouth at bedtime. *May take one tablet every 6 hours as needed for pain    . amoxicillin (AMOXIL) 500 MG capsule Take 500 mg by mouth 4 (four) times daily as needed.    Marland Kitchen atorvastatin (LIPITOR) 20 MG tablet Take 20 mg by mouth daily.    Marland Kitchen buPROPion (WELLBUTRIN XL) 150 MG 24 hr tablet Take 150 mg by mouth daily.    . calcium-vitamin D (OSCAL 500/200 D-3) 500-200 MG-UNIT tablet Take 1 tablet by mouth 2 (two) times daily.    . Cholecalciferol (VITAMIN D-3) 1000 UNITS CAPS Take 1 capsule by mouth daily.    . clopidogrel (PLAVIX) 75 MG tablet TAKE ONE TABLET BY MOUTH DAILY WITH BREAKFAST 30 tablet 6  . docusate sodium (COLACE) 100 MG capsule Take 200 mg by mouth daily.     Marland Kitchen donepezil (ARICEPT) 10 MG tablet Take 10 mg by mouth at bedtime.    . dorzolamide-timolol (COSOPT) 22.3-6.8 MG/ML ophthalmic solution Place 1 drop into the left eye 2 (two) times daily.     . DULoxetine (CYMBALTA) 60 MG capsule Take 60 mg by mouth daily.    . furosemide (LASIX)  20 MG tablet Take 20 mg by mouth daily.    Marland Kitchen HYDROcodone-acetaminophen (NORCO/VICODIN) 5-325 MG tablet Take 1 tablet by mouth every 8 (eight) hours as needed for moderate pain.    Marland Kitchen levothyroxine (SYNTHROID, LEVOTHROID) 75 MCG tablet Take 75 mcg by mouth daily before breakfast.    . meloxicam (MOBIC) 7.5 MG tablet Take 7.5 mg by mouth daily.    . Multiple Vitamin (MULTIVITAMIN WITH MINERALS) TABS tablet Take 1 tablet by mouth daily.    . mupirocin ointment (BACTROBAN) 2 % Place 1 application into the nose 2 (two) times daily. 22 g 0  . pantoprazole (PROTONIX) 20 MG tablet Take 20 mg by mouth daily.    . potassium chloride SA (K-DUR,KLOR-CON) 20 MEQ tablet Take 20 mEq by mouth daily.    .  ranitidine (ZANTAC) 150 MG tablet Take 150 mg by mouth at bedtime.     Marland Kitchen tiZANidine (ZANAFLEX) 2 MG tablet Take 2 mg by mouth every 8 (eight) hours as needed for muscle spasms.     No current facility-administered medications for this visit.    Allergies:  Statins   Social History: The patient  reports that she has never smoked. She has never used smokeless tobacco. She reports that she does not drink alcohol or use drugs.   ROS:  Please see the history of present illness. Otherwise, complete review of systems is positive for leg weakness, hearing loss, dementia.  All other systems are reviewed and negative.   Physical Exam: VS:  BP (!) 141/79   Pulse 72   Ht 5\' 5"  (1.651 m)   Wt 139 lb 9.6 oz (63.3 kg)   BMI 23.23 kg/m , BMI Body mass index is 23.23 kg/m.  Wt Readings from Last 3 Encounters:  03/09/17 139 lb 9.6 oz (63.3 kg)  01/28/17 143 lb (64.9 kg)  09/16/16 143 lb (64.9 kg)    Elderly woman, appears comfortable at rest.  HEENT: Conjunctiva and lids normal, oropharynx moist mucosa.  Neck: Supple, no carotid bruits, no thyromegaly.  Lungs: Clear to auscultation, nonlabored.  Cardiac: Regular rate and rhythm.  Thorax: Stable device pocket site.  Extremities: No pitting edema.  Skin: Warm and dry, resolving ecchymoses on the lower legs from falls. Musculoskeletal: Kyphosis noted. Neuropsychiatric: Alert and oriented 2, affect appropriate.  ECG: I personally reviewed the tracing from 09/16/2016 which showed a ventricular paced rhythm with atrial sensing.  Recent Labwork: 09/16/2016: ALT 14; AST 25; Hemoglobin 13.9; Platelets 189; TSH 1.182 09/17/2016: BUN 21; Creatinine, Ser 1.38; Potassium 4.5; Sodium 138     Component Value Date/Time   CHOL 264 (H) 09/17/2016 0652   TRIG 140 09/17/2016 0652   HDL 47 09/17/2016 0652   CHOLHDL 5.6 09/17/2016 0652   VLDL 28 09/17/2016 0652   LDLCALC 189 (H) 09/17/2016 0652    Other Studies Reviewed Today:  Echocardiogram  09/17/2016: Study Conclusions  - Left ventricle: The cavity size was normal. Wall thickness was   increased increased in a pattern of mild to moderate LVH.   Systolic function was normal. The estimated ejection fraction was   in the range of 60% to 65%. The study is not technically   sufficient to allow evaluation of LV diastolic function. - Aortic valve: Moderately calcified annulus. Trileaflet;   moderately thickened leaflets. There was mild stenosis. Mean   gradient (S): 11 mm Hg. Valve area (VTI): 2.23 cm^2. Valve area   (Vmax): 2.37 cm^2. Valve area (Vmean): 1.52 cm^2. - Technically adequate study.  Assessment and Plan:  1. CAD status post DES interventions to the LAD in 2005 as well as 2014. She is currently stable without angina symptoms on medical therapy. Continue Plavix and statin.  2. Hyperlipidemia, on Lipitor. She is following with Dr. Reuel Boomaniel.  3. Frequent falls, using a rolling walker and working with physical therapy.  4. History of complete heart block status post Medtronic pacemaker.  5. Chronic diastolic heart failure, LVEF 60-65% by most recent assessment. She remains on low-dose Lasix with potassium supplements, volume status has been adequately controlled. Weight down compared to last visit.  Current medicines were reviewed with the patient today.  Disposition: Follow-up in 6 months.  Signed, Jonelle SidleSamuel G. Ilaria Much, MD, [facility] 03/09/2017 11:28 AM    [facility] [address], [place], [place] [address] Phone: [phone]; Fax: [phone]

## 2017-03-09 ENCOUNTER — Ambulatory Visit (INDEPENDENT_AMBULATORY_CARE_PROVIDER_SITE_OTHER): Payer: Medicare Other | Admitting: Cardiology

## 2017-03-09 ENCOUNTER — Encounter: Payer: Self-pay | Admitting: Cardiology

## 2017-03-09 VITALS — BP 141/79 | HR 72 | Ht 65.0 in | Wt 139.6 lb

## 2017-03-09 DIAGNOSIS — E782 Mixed hyperlipidemia: Secondary | ICD-10-CM | POA: Diagnosis not present

## 2017-03-09 DIAGNOSIS — M6281 Muscle weakness (generalized): Secondary | ICD-10-CM | POA: Diagnosis not present

## 2017-03-09 DIAGNOSIS — Z9181 History of falling: Secondary | ICD-10-CM | POA: Diagnosis not present

## 2017-03-09 DIAGNOSIS — I509 Heart failure, unspecified: Secondary | ICD-10-CM | POA: Diagnosis not present

## 2017-03-09 DIAGNOSIS — Z8679 Personal history of other diseases of the circulatory system: Secondary | ICD-10-CM | POA: Diagnosis not present

## 2017-03-09 DIAGNOSIS — Z950 Presence of cardiac pacemaker: Secondary | ICD-10-CM | POA: Diagnosis not present

## 2017-03-09 DIAGNOSIS — G459 Transient cerebral ischemic attack, unspecified: Secondary | ICD-10-CM | POA: Diagnosis not present

## 2017-03-09 DIAGNOSIS — I2510 Atherosclerotic heart disease of native coronary artery without angina pectoris: Secondary | ICD-10-CM

## 2017-03-09 DIAGNOSIS — I5032 Chronic diastolic (congestive) heart failure: Secondary | ICD-10-CM

## 2017-03-09 DIAGNOSIS — R296 Repeated falls: Secondary | ICD-10-CM | POA: Diagnosis not present

## 2017-03-09 NOTE — Patient Instructions (Signed)

## 2017-03-11 DIAGNOSIS — I509 Heart failure, unspecified: Secondary | ICD-10-CM | POA: Diagnosis not present

## 2017-03-11 DIAGNOSIS — M6281 Muscle weakness (generalized): Secondary | ICD-10-CM | POA: Diagnosis not present

## 2017-03-11 DIAGNOSIS — Z950 Presence of cardiac pacemaker: Secondary | ICD-10-CM | POA: Diagnosis not present

## 2017-03-11 DIAGNOSIS — Z9181 History of falling: Secondary | ICD-10-CM | POA: Diagnosis not present

## 2017-03-11 DIAGNOSIS — G459 Transient cerebral ischemic attack, unspecified: Secondary | ICD-10-CM | POA: Diagnosis not present

## 2017-03-14 DIAGNOSIS — M6281 Muscle weakness (generalized): Secondary | ICD-10-CM | POA: Diagnosis not present

## 2017-03-14 DIAGNOSIS — Z950 Presence of cardiac pacemaker: Secondary | ICD-10-CM | POA: Diagnosis not present

## 2017-03-14 DIAGNOSIS — G459 Transient cerebral ischemic attack, unspecified: Secondary | ICD-10-CM | POA: Diagnosis not present

## 2017-03-14 DIAGNOSIS — I509 Heart failure, unspecified: Secondary | ICD-10-CM | POA: Diagnosis not present

## 2017-03-14 DIAGNOSIS — Z9181 History of falling: Secondary | ICD-10-CM | POA: Diagnosis not present

## 2017-03-15 DIAGNOSIS — N183 Chronic kidney disease, stage 3 (moderate): Secondary | ICD-10-CM | POA: Diagnosis not present

## 2017-03-15 DIAGNOSIS — M1991 Primary osteoarthritis, unspecified site: Secondary | ICD-10-CM | POA: Diagnosis not present

## 2017-03-15 DIAGNOSIS — M545 Low back pain: Secondary | ICD-10-CM | POA: Diagnosis not present

## 2017-03-15 DIAGNOSIS — I6302 Cerebral infarction due to thrombosis of basilar artery: Secondary | ICD-10-CM | POA: Diagnosis not present

## 2017-03-15 DIAGNOSIS — R2681 Unsteadiness on feet: Secondary | ICD-10-CM | POA: Diagnosis not present

## 2017-03-16 DIAGNOSIS — Z950 Presence of cardiac pacemaker: Secondary | ICD-10-CM | POA: Diagnosis not present

## 2017-03-16 DIAGNOSIS — M6281 Muscle weakness (generalized): Secondary | ICD-10-CM | POA: Diagnosis not present

## 2017-03-16 DIAGNOSIS — Z9181 History of falling: Secondary | ICD-10-CM | POA: Diagnosis not present

## 2017-03-16 DIAGNOSIS — G459 Transient cerebral ischemic attack, unspecified: Secondary | ICD-10-CM | POA: Diagnosis not present

## 2017-03-16 DIAGNOSIS — I509 Heart failure, unspecified: Secondary | ICD-10-CM | POA: Diagnosis not present

## 2017-03-18 DIAGNOSIS — Z9181 History of falling: Secondary | ICD-10-CM | POA: Diagnosis not present

## 2017-03-18 DIAGNOSIS — I509 Heart failure, unspecified: Secondary | ICD-10-CM | POA: Diagnosis not present

## 2017-03-18 DIAGNOSIS — G459 Transient cerebral ischemic attack, unspecified: Secondary | ICD-10-CM | POA: Diagnosis not present

## 2017-03-18 DIAGNOSIS — M6281 Muscle weakness (generalized): Secondary | ICD-10-CM | POA: Diagnosis not present

## 2017-03-18 DIAGNOSIS — Z950 Presence of cardiac pacemaker: Secondary | ICD-10-CM | POA: Diagnosis not present

## 2017-03-21 DIAGNOSIS — M6281 Muscle weakness (generalized): Secondary | ICD-10-CM | POA: Diagnosis not present

## 2017-03-21 DIAGNOSIS — G459 Transient cerebral ischemic attack, unspecified: Secondary | ICD-10-CM | POA: Diagnosis not present

## 2017-03-21 DIAGNOSIS — Z950 Presence of cardiac pacemaker: Secondary | ICD-10-CM | POA: Diagnosis not present

## 2017-03-21 DIAGNOSIS — Z9181 History of falling: Secondary | ICD-10-CM | POA: Diagnosis not present

## 2017-03-21 DIAGNOSIS — I509 Heart failure, unspecified: Secondary | ICD-10-CM | POA: Diagnosis not present

## 2017-03-23 DIAGNOSIS — I509 Heart failure, unspecified: Secondary | ICD-10-CM | POA: Diagnosis not present

## 2017-03-23 DIAGNOSIS — M6281 Muscle weakness (generalized): Secondary | ICD-10-CM | POA: Diagnosis not present

## 2017-03-23 DIAGNOSIS — Z950 Presence of cardiac pacemaker: Secondary | ICD-10-CM | POA: Diagnosis not present

## 2017-03-23 DIAGNOSIS — Z9181 History of falling: Secondary | ICD-10-CM | POA: Diagnosis not present

## 2017-03-23 DIAGNOSIS — G459 Transient cerebral ischemic attack, unspecified: Secondary | ICD-10-CM | POA: Diagnosis not present

## 2017-03-25 DIAGNOSIS — G459 Transient cerebral ischemic attack, unspecified: Secondary | ICD-10-CM | POA: Diagnosis not present

## 2017-03-25 DIAGNOSIS — M6281 Muscle weakness (generalized): Secondary | ICD-10-CM | POA: Diagnosis not present

## 2017-03-25 DIAGNOSIS — Z9181 History of falling: Secondary | ICD-10-CM | POA: Diagnosis not present

## 2017-03-25 DIAGNOSIS — Z950 Presence of cardiac pacemaker: Secondary | ICD-10-CM | POA: Diagnosis not present

## 2017-03-25 DIAGNOSIS — I509 Heart failure, unspecified: Secondary | ICD-10-CM | POA: Diagnosis not present

## 2017-03-28 DIAGNOSIS — M6281 Muscle weakness (generalized): Secondary | ICD-10-CM | POA: Diagnosis not present

## 2017-03-28 DIAGNOSIS — M25551 Pain in right hip: Secondary | ICD-10-CM | POA: Diagnosis not present

## 2017-03-28 DIAGNOSIS — M545 Low back pain: Secondary | ICD-10-CM | POA: Diagnosis not present

## 2017-03-28 DIAGNOSIS — I10 Essential (primary) hypertension: Secondary | ICD-10-CM | POA: Diagnosis not present

## 2017-03-28 DIAGNOSIS — S3992XA Unspecified injury of lower back, initial encounter: Secondary | ICD-10-CM | POA: Diagnosis not present

## 2017-03-28 DIAGNOSIS — I2510 Atherosclerotic heart disease of native coronary artery without angina pectoris: Secondary | ICD-10-CM | POA: Diagnosis not present

## 2017-03-28 DIAGNOSIS — Z79899 Other long term (current) drug therapy: Secondary | ICD-10-CM | POA: Diagnosis not present

## 2017-03-28 DIAGNOSIS — Z7982 Long term (current) use of aspirin: Secondary | ICD-10-CM | POA: Diagnosis not present

## 2017-03-28 DIAGNOSIS — S300XXA Contusion of lower back and pelvis, initial encounter: Secondary | ICD-10-CM | POA: Diagnosis not present

## 2017-03-28 DIAGNOSIS — W1839XA Other fall on same level, initial encounter: Secondary | ICD-10-CM | POA: Diagnosis not present

## 2017-03-28 DIAGNOSIS — R100 Acute abdomen: Secondary | ICD-10-CM | POA: Diagnosis not present

## 2017-03-28 DIAGNOSIS — Z950 Presence of cardiac pacemaker: Secondary | ICD-10-CM | POA: Diagnosis not present

## 2017-03-28 DIAGNOSIS — R531 Weakness: Secondary | ICD-10-CM | POA: Diagnosis not present

## 2017-03-28 DIAGNOSIS — K219 Gastro-esophageal reflux disease without esophagitis: Secondary | ICD-10-CM | POA: Diagnosis not present

## 2017-03-28 DIAGNOSIS — R404 Transient alteration of awareness: Secondary | ICD-10-CM | POA: Diagnosis not present

## 2017-03-28 DIAGNOSIS — S3993XA Unspecified injury of pelvis, initial encounter: Secondary | ICD-10-CM | POA: Diagnosis not present

## 2017-03-28 DIAGNOSIS — Z9181 History of falling: Secondary | ICD-10-CM | POA: Diagnosis not present

## 2017-03-28 DIAGNOSIS — R102 Pelvic and perineal pain: Secondary | ICD-10-CM | POA: Diagnosis not present

## 2017-03-28 DIAGNOSIS — Z7902 Long term (current) use of antithrombotics/antiplatelets: Secondary | ICD-10-CM | POA: Diagnosis not present

## 2017-03-28 DIAGNOSIS — E039 Hypothyroidism, unspecified: Secondary | ICD-10-CM | POA: Diagnosis not present

## 2017-03-28 DIAGNOSIS — I509 Heart failure, unspecified: Secondary | ICD-10-CM | POA: Diagnosis not present

## 2017-03-28 DIAGNOSIS — G459 Transient cerebral ischemic attack, unspecified: Secondary | ICD-10-CM | POA: Diagnosis not present

## 2017-03-30 DIAGNOSIS — M545 Low back pain: Secondary | ICD-10-CM | POA: Diagnosis not present

## 2017-03-30 DIAGNOSIS — S0990XA Unspecified injury of head, initial encounter: Secondary | ICD-10-CM | POA: Diagnosis not present

## 2017-03-30 DIAGNOSIS — E039 Hypothyroidism, unspecified: Secondary | ICD-10-CM | POA: Diagnosis not present

## 2017-03-30 DIAGNOSIS — Z950 Presence of cardiac pacemaker: Secondary | ICD-10-CM | POA: Diagnosis not present

## 2017-03-30 DIAGNOSIS — R55 Syncope and collapse: Secondary | ICD-10-CM | POA: Diagnosis not present

## 2017-03-30 DIAGNOSIS — I509 Heart failure, unspecified: Secondary | ICD-10-CM | POA: Diagnosis not present

## 2017-03-30 DIAGNOSIS — G459 Transient cerebral ischemic attack, unspecified: Secondary | ICD-10-CM | POA: Diagnosis not present

## 2017-03-30 DIAGNOSIS — M5489 Other dorsalgia: Secondary | ICD-10-CM | POA: Diagnosis not present

## 2017-03-30 DIAGNOSIS — I10 Essential (primary) hypertension: Secondary | ICD-10-CM | POA: Diagnosis not present

## 2017-03-30 DIAGNOSIS — Z87891 Personal history of nicotine dependence: Secondary | ICD-10-CM | POA: Diagnosis not present

## 2017-03-30 DIAGNOSIS — R569 Unspecified convulsions: Secondary | ICD-10-CM | POA: Diagnosis not present

## 2017-03-30 DIAGNOSIS — K219 Gastro-esophageal reflux disease without esophagitis: Secondary | ICD-10-CM | POA: Diagnosis not present

## 2017-03-30 DIAGNOSIS — I959 Hypotension, unspecified: Secondary | ICD-10-CM | POA: Diagnosis not present

## 2017-03-30 DIAGNOSIS — M6281 Muscle weakness (generalized): Secondary | ICD-10-CM | POA: Diagnosis not present

## 2017-03-30 DIAGNOSIS — S199XXA Unspecified injury of neck, initial encounter: Secondary | ICD-10-CM | POA: Diagnosis not present

## 2017-03-30 DIAGNOSIS — Z7902 Long term (current) use of antithrombotics/antiplatelets: Secondary | ICD-10-CM | POA: Diagnosis not present

## 2017-03-30 DIAGNOSIS — Z79899 Other long term (current) drug therapy: Secondary | ICD-10-CM | POA: Diagnosis not present

## 2017-03-30 DIAGNOSIS — E860 Dehydration: Secondary | ICD-10-CM | POA: Diagnosis not present

## 2017-04-01 DIAGNOSIS — G459 Transient cerebral ischemic attack, unspecified: Secondary | ICD-10-CM | POA: Diagnosis not present

## 2017-04-01 DIAGNOSIS — I509 Heart failure, unspecified: Secondary | ICD-10-CM | POA: Diagnosis not present

## 2017-04-01 DIAGNOSIS — Z9181 History of falling: Secondary | ICD-10-CM | POA: Diagnosis not present

## 2017-04-01 DIAGNOSIS — Z950 Presence of cardiac pacemaker: Secondary | ICD-10-CM | POA: Diagnosis not present

## 2017-04-01 DIAGNOSIS — M6281 Muscle weakness (generalized): Secondary | ICD-10-CM | POA: Diagnosis not present

## 2017-04-05 DIAGNOSIS — I509 Heart failure, unspecified: Secondary | ICD-10-CM | POA: Diagnosis not present

## 2017-04-05 DIAGNOSIS — M6281 Muscle weakness (generalized): Secondary | ICD-10-CM | POA: Diagnosis not present

## 2017-04-05 DIAGNOSIS — G459 Transient cerebral ischemic attack, unspecified: Secondary | ICD-10-CM | POA: Diagnosis not present

## 2017-04-05 DIAGNOSIS — Z950 Presence of cardiac pacemaker: Secondary | ICD-10-CM | POA: Diagnosis not present

## 2017-04-05 DIAGNOSIS — Z9181 History of falling: Secondary | ICD-10-CM | POA: Diagnosis not present

## 2017-05-01 DIAGNOSIS — I2510 Atherosclerotic heart disease of native coronary artery without angina pectoris: Secondary | ICD-10-CM | POA: Diagnosis not present

## 2017-05-01 DIAGNOSIS — Z87891 Personal history of nicotine dependence: Secondary | ICD-10-CM | POA: Diagnosis not present

## 2017-05-01 DIAGNOSIS — Z79899 Other long term (current) drug therapy: Secondary | ICD-10-CM | POA: Diagnosis not present

## 2017-05-01 DIAGNOSIS — I10 Essential (primary) hypertension: Secondary | ICD-10-CM | POA: Diagnosis not present

## 2017-05-01 DIAGNOSIS — Z950 Presence of cardiac pacemaker: Secondary | ICD-10-CM | POA: Diagnosis not present

## 2017-05-01 DIAGNOSIS — R509 Fever, unspecified: Secondary | ICD-10-CM | POA: Diagnosis not present

## 2017-05-01 DIAGNOSIS — Z7902 Long term (current) use of antithrombotics/antiplatelets: Secondary | ICD-10-CM | POA: Diagnosis not present

## 2017-05-01 DIAGNOSIS — K219 Gastro-esophageal reflux disease without esophagitis: Secondary | ICD-10-CM | POA: Diagnosis not present

## 2017-05-01 DIAGNOSIS — J189 Pneumonia, unspecified organism: Secondary | ICD-10-CM | POA: Diagnosis not present

## 2017-05-01 DIAGNOSIS — R0602 Shortness of breath: Secondary | ICD-10-CM | POA: Diagnosis not present

## 2017-05-01 DIAGNOSIS — R05 Cough: Secondary | ICD-10-CM | POA: Diagnosis not present

## 2017-05-01 DIAGNOSIS — E039 Hypothyroidism, unspecified: Secondary | ICD-10-CM | POA: Diagnosis not present

## 2017-05-09 DIAGNOSIS — E039 Hypothyroidism, unspecified: Secondary | ICD-10-CM | POA: Diagnosis not present

## 2017-05-09 DIAGNOSIS — Z79899 Other long term (current) drug therapy: Secondary | ICD-10-CM | POA: Diagnosis not present

## 2017-05-09 DIAGNOSIS — R404 Transient alteration of awareness: Secondary | ICD-10-CM | POA: Diagnosis not present

## 2017-05-09 DIAGNOSIS — E860 Dehydration: Secondary | ICD-10-CM | POA: Diagnosis not present

## 2017-05-09 DIAGNOSIS — Z7902 Long term (current) use of antithrombotics/antiplatelets: Secondary | ICD-10-CM | POA: Diagnosis not present

## 2017-05-09 DIAGNOSIS — I129 Hypertensive chronic kidney disease with stage 1 through stage 4 chronic kidney disease, or unspecified chronic kidney disease: Secondary | ICD-10-CM | POA: Diagnosis not present

## 2017-05-09 DIAGNOSIS — R627 Adult failure to thrive: Secondary | ICD-10-CM | POA: Diagnosis not present

## 2017-05-09 DIAGNOSIS — Z950 Presence of cardiac pacemaker: Secondary | ICD-10-CM | POA: Diagnosis not present

## 2017-05-09 DIAGNOSIS — K219 Gastro-esophageal reflux disease without esophagitis: Secondary | ICD-10-CM | POA: Diagnosis not present

## 2017-05-09 DIAGNOSIS — R531 Weakness: Secondary | ICD-10-CM | POA: Diagnosis not present

## 2017-05-09 DIAGNOSIS — Z87891 Personal history of nicotine dependence: Secondary | ICD-10-CM | POA: Diagnosis not present

## 2017-05-09 DIAGNOSIS — N189 Chronic kidney disease, unspecified: Secondary | ICD-10-CM | POA: Diagnosis not present

## 2017-05-09 DIAGNOSIS — I2510 Atherosclerotic heart disease of native coronary artery without angina pectoris: Secondary | ICD-10-CM | POA: Diagnosis not present

## 2017-05-15 DIAGNOSIS — Z79899 Other long term (current) drug therapy: Secondary | ICD-10-CM | POA: Diagnosis not present

## 2017-05-15 DIAGNOSIS — Z950 Presence of cardiac pacemaker: Secondary | ICD-10-CM | POA: Diagnosis not present

## 2017-05-15 DIAGNOSIS — Z7902 Long term (current) use of antithrombotics/antiplatelets: Secondary | ICD-10-CM | POA: Diagnosis not present

## 2017-05-15 DIAGNOSIS — R0781 Pleurodynia: Secondary | ICD-10-CM | POA: Diagnosis not present

## 2017-05-15 DIAGNOSIS — H409 Unspecified glaucoma: Secondary | ICD-10-CM | POA: Diagnosis not present

## 2017-05-15 DIAGNOSIS — Z8673 Personal history of transient ischemic attack (TIA), and cerebral infarction without residual deficits: Secondary | ICD-10-CM | POA: Diagnosis not present

## 2017-05-15 DIAGNOSIS — K219 Gastro-esophageal reflux disease without esophagitis: Secondary | ICD-10-CM | POA: Diagnosis not present

## 2017-05-15 DIAGNOSIS — S2241XA Multiple fractures of ribs, right side, initial encounter for closed fracture: Secondary | ICD-10-CM | POA: Diagnosis not present

## 2017-05-15 DIAGNOSIS — E785 Hyperlipidemia, unspecified: Secondary | ICD-10-CM | POA: Diagnosis not present

## 2017-05-15 DIAGNOSIS — R262 Difficulty in walking, not elsewhere classified: Secondary | ICD-10-CM | POA: Diagnosis not present

## 2017-05-15 DIAGNOSIS — I5032 Chronic diastolic (congestive) heart failure: Secondary | ICD-10-CM | POA: Diagnosis not present

## 2017-05-15 DIAGNOSIS — N189 Chronic kidney disease, unspecified: Secondary | ICD-10-CM | POA: Diagnosis not present

## 2017-05-15 DIAGNOSIS — I700 Atherosclerosis of aorta: Secondary | ICD-10-CM | POA: Diagnosis not present

## 2017-05-15 DIAGNOSIS — Z66 Do not resuscitate: Secondary | ICD-10-CM | POA: Diagnosis not present

## 2017-05-15 DIAGNOSIS — M810 Age-related osteoporosis without current pathological fracture: Secondary | ICD-10-CM | POA: Diagnosis not present

## 2017-05-15 DIAGNOSIS — Z981 Arthrodesis status: Secondary | ICD-10-CM | POA: Diagnosis not present

## 2017-05-15 DIAGNOSIS — E039 Hypothyroidism, unspecified: Secondary | ICD-10-CM | POA: Diagnosis not present

## 2017-05-15 DIAGNOSIS — G309 Alzheimer's disease, unspecified: Secondary | ICD-10-CM | POA: Diagnosis not present

## 2017-05-15 DIAGNOSIS — Z955 Presence of coronary angioplasty implant and graft: Secondary | ICD-10-CM | POA: Diagnosis not present

## 2017-05-15 DIAGNOSIS — I130 Hypertensive heart and chronic kidney disease with heart failure and stage 1 through stage 4 chronic kidney disease, or unspecified chronic kidney disease: Secondary | ICD-10-CM | POA: Diagnosis not present

## 2017-05-15 DIAGNOSIS — I2510 Atherosclerotic heart disease of native coronary artery without angina pectoris: Secondary | ICD-10-CM | POA: Diagnosis not present

## 2017-05-15 DIAGNOSIS — W1839XA Other fall on same level, initial encounter: Secondary | ICD-10-CM | POA: Diagnosis not present

## 2017-05-15 DIAGNOSIS — K5909 Other constipation: Secondary | ICD-10-CM | POA: Diagnosis not present

## 2017-05-15 DIAGNOSIS — D869 Sarcoidosis, unspecified: Secondary | ICD-10-CM | POA: Diagnosis not present

## 2017-05-16 DIAGNOSIS — Z955 Presence of coronary angioplasty implant and graft: Secondary | ICD-10-CM | POA: Diagnosis not present

## 2017-05-16 DIAGNOSIS — E785 Hyperlipidemia, unspecified: Secondary | ICD-10-CM | POA: Diagnosis not present

## 2017-05-16 DIAGNOSIS — R278 Other lack of coordination: Secondary | ICD-10-CM | POA: Diagnosis not present

## 2017-05-16 DIAGNOSIS — Z8673 Personal history of transient ischemic attack (TIA), and cerebral infarction without residual deficits: Secondary | ICD-10-CM | POA: Diagnosis not present

## 2017-05-16 DIAGNOSIS — D869 Sarcoidosis, unspecified: Secondary | ICD-10-CM | POA: Diagnosis not present

## 2017-05-16 DIAGNOSIS — H409 Unspecified glaucoma: Secondary | ICD-10-CM | POA: Diagnosis not present

## 2017-05-16 DIAGNOSIS — S2241XD Multiple fractures of ribs, right side, subsequent encounter for fracture with routine healing: Secondary | ICD-10-CM | POA: Diagnosis not present

## 2017-05-16 DIAGNOSIS — Z9181 History of falling: Secondary | ICD-10-CM | POA: Diagnosis not present

## 2017-05-16 DIAGNOSIS — Z950 Presence of cardiac pacemaker: Secondary | ICD-10-CM | POA: Diagnosis not present

## 2017-05-16 DIAGNOSIS — Z79899 Other long term (current) drug therapy: Secondary | ICD-10-CM | POA: Diagnosis not present

## 2017-05-16 DIAGNOSIS — I5032 Chronic diastolic (congestive) heart failure: Secondary | ICD-10-CM | POA: Diagnosis not present

## 2017-05-16 DIAGNOSIS — K5909 Other constipation: Secondary | ICD-10-CM | POA: Diagnosis not present

## 2017-05-16 DIAGNOSIS — I130 Hypertensive heart and chronic kidney disease with heart failure and stage 1 through stage 4 chronic kidney disease, or unspecified chronic kidney disease: Secondary | ICD-10-CM | POA: Diagnosis not present

## 2017-05-16 DIAGNOSIS — R262 Difficulty in walking, not elsewhere classified: Secondary | ICD-10-CM | POA: Diagnosis not present

## 2017-05-16 DIAGNOSIS — M6281 Muscle weakness (generalized): Secondary | ICD-10-CM | POA: Diagnosis not present

## 2017-05-16 DIAGNOSIS — K219 Gastro-esophageal reflux disease without esophagitis: Secondary | ICD-10-CM | POA: Diagnosis not present

## 2017-05-16 DIAGNOSIS — S2241XA Multiple fractures of ribs, right side, initial encounter for closed fracture: Secondary | ICD-10-CM | POA: Diagnosis not present

## 2017-05-16 DIAGNOSIS — I700 Atherosclerosis of aorta: Secondary | ICD-10-CM | POA: Diagnosis not present

## 2017-05-16 DIAGNOSIS — M810 Age-related osteoporosis without current pathological fracture: Secondary | ICD-10-CM | POA: Diagnosis not present

## 2017-05-16 DIAGNOSIS — R2681 Unsteadiness on feet: Secondary | ICD-10-CM | POA: Diagnosis not present

## 2017-05-16 DIAGNOSIS — Z66 Do not resuscitate: Secondary | ICD-10-CM | POA: Diagnosis not present

## 2017-05-16 DIAGNOSIS — E039 Hypothyroidism, unspecified: Secondary | ICD-10-CM | POA: Diagnosis not present

## 2017-05-16 DIAGNOSIS — I509 Heart failure, unspecified: Secondary | ICD-10-CM | POA: Diagnosis not present

## 2017-05-16 DIAGNOSIS — Z7902 Long term (current) use of antithrombotics/antiplatelets: Secondary | ICD-10-CM | POA: Diagnosis not present

## 2017-05-16 DIAGNOSIS — G309 Alzheimer's disease, unspecified: Secondary | ICD-10-CM | POA: Diagnosis not present

## 2017-05-16 DIAGNOSIS — I2510 Atherosclerotic heart disease of native coronary artery without angina pectoris: Secondary | ICD-10-CM | POA: Diagnosis not present

## 2017-05-16 DIAGNOSIS — Z981 Arthrodesis status: Secondary | ICD-10-CM | POA: Diagnosis not present

## 2017-05-16 DIAGNOSIS — N189 Chronic kidney disease, unspecified: Secondary | ICD-10-CM | POA: Diagnosis not present

## 2017-05-19 DIAGNOSIS — S2241XA Multiple fractures of ribs, right side, initial encounter for closed fracture: Secondary | ICD-10-CM | POA: Diagnosis not present

## 2017-06-07 DIAGNOSIS — S2241XA Multiple fractures of ribs, right side, initial encounter for closed fracture: Secondary | ICD-10-CM | POA: Diagnosis not present

## 2017-06-26 DIAGNOSIS — L210 Seborrhea capitis: Secondary | ICD-10-CM | POA: Diagnosis not present

## 2017-07-07 DIAGNOSIS — Z5181 Encounter for therapeutic drug level monitoring: Secondary | ICD-10-CM | POA: Diagnosis not present

## 2017-07-07 DIAGNOSIS — M79651 Pain in right thigh: Secondary | ICD-10-CM | POA: Diagnosis not present

## 2017-07-07 DIAGNOSIS — Z79899 Other long term (current) drug therapy: Secondary | ICD-10-CM | POA: Diagnosis not present

## 2017-08-05 DIAGNOSIS — L210 Seborrhea capitis: Secondary | ICD-10-CM | POA: Diagnosis not present

## 2017-09-12 DIAGNOSIS — R2689 Other abnormalities of gait and mobility: Secondary | ICD-10-CM | POA: Diagnosis not present

## 2017-09-12 DIAGNOSIS — M6281 Muscle weakness (generalized): Secondary | ICD-10-CM | POA: Diagnosis not present

## 2017-09-14 DIAGNOSIS — M6281 Muscle weakness (generalized): Secondary | ICD-10-CM | POA: Diagnosis not present

## 2017-09-14 DIAGNOSIS — R2689 Other abnormalities of gait and mobility: Secondary | ICD-10-CM | POA: Diagnosis not present

## 2017-09-16 DIAGNOSIS — M6281 Muscle weakness (generalized): Secondary | ICD-10-CM | POA: Diagnosis not present

## 2017-09-16 DIAGNOSIS — R2689 Other abnormalities of gait and mobility: Secondary | ICD-10-CM | POA: Diagnosis not present

## 2017-09-19 DIAGNOSIS — R2689 Other abnormalities of gait and mobility: Secondary | ICD-10-CM | POA: Diagnosis not present

## 2017-09-19 DIAGNOSIS — M6281 Muscle weakness (generalized): Secondary | ICD-10-CM | POA: Diagnosis not present

## 2017-09-20 DIAGNOSIS — M6281 Muscle weakness (generalized): Secondary | ICD-10-CM | POA: Diagnosis not present

## 2017-09-20 DIAGNOSIS — R2689 Other abnormalities of gait and mobility: Secondary | ICD-10-CM | POA: Diagnosis not present

## 2017-09-21 DIAGNOSIS — R2689 Other abnormalities of gait and mobility: Secondary | ICD-10-CM | POA: Diagnosis not present

## 2017-09-21 DIAGNOSIS — M6281 Muscle weakness (generalized): Secondary | ICD-10-CM | POA: Diagnosis not present

## 2017-09-22 DIAGNOSIS — R2689 Other abnormalities of gait and mobility: Secondary | ICD-10-CM | POA: Diagnosis not present

## 2017-09-22 DIAGNOSIS — M6281 Muscle weakness (generalized): Secondary | ICD-10-CM | POA: Diagnosis not present

## 2017-09-23 DIAGNOSIS — M6281 Muscle weakness (generalized): Secondary | ICD-10-CM | POA: Diagnosis not present

## 2017-09-23 DIAGNOSIS — R2689 Other abnormalities of gait and mobility: Secondary | ICD-10-CM | POA: Diagnosis not present

## 2017-09-24 DIAGNOSIS — R2689 Other abnormalities of gait and mobility: Secondary | ICD-10-CM | POA: Diagnosis not present

## 2017-09-24 DIAGNOSIS — M6281 Muscle weakness (generalized): Secondary | ICD-10-CM | POA: Diagnosis not present

## 2017-09-26 DIAGNOSIS — M6281 Muscle weakness (generalized): Secondary | ICD-10-CM | POA: Diagnosis not present

## 2017-09-26 DIAGNOSIS — R2689 Other abnormalities of gait and mobility: Secondary | ICD-10-CM | POA: Diagnosis not present

## 2017-09-27 DIAGNOSIS — R2689 Other abnormalities of gait and mobility: Secondary | ICD-10-CM | POA: Diagnosis not present

## 2017-09-27 DIAGNOSIS — M6281 Muscle weakness (generalized): Secondary | ICD-10-CM | POA: Diagnosis not present

## 2017-09-28 DIAGNOSIS — M6281 Muscle weakness (generalized): Secondary | ICD-10-CM | POA: Diagnosis not present

## 2017-09-28 DIAGNOSIS — R2689 Other abnormalities of gait and mobility: Secondary | ICD-10-CM | POA: Diagnosis not present

## 2017-09-29 DIAGNOSIS — M6281 Muscle weakness (generalized): Secondary | ICD-10-CM | POA: Diagnosis not present

## 2017-09-29 DIAGNOSIS — R2689 Other abnormalities of gait and mobility: Secondary | ICD-10-CM | POA: Diagnosis not present

## 2017-09-30 DIAGNOSIS — R2689 Other abnormalities of gait and mobility: Secondary | ICD-10-CM | POA: Diagnosis not present

## 2017-09-30 DIAGNOSIS — M6281 Muscle weakness (generalized): Secondary | ICD-10-CM | POA: Diagnosis not present

## 2017-10-03 DIAGNOSIS — M6281 Muscle weakness (generalized): Secondary | ICD-10-CM | POA: Diagnosis not present

## 2017-10-03 DIAGNOSIS — R2689 Other abnormalities of gait and mobility: Secondary | ICD-10-CM | POA: Diagnosis not present

## 2017-10-04 DIAGNOSIS — R2689 Other abnormalities of gait and mobility: Secondary | ICD-10-CM | POA: Diagnosis not present

## 2017-10-04 DIAGNOSIS — M6281 Muscle weakness (generalized): Secondary | ICD-10-CM | POA: Diagnosis not present

## 2017-10-04 DIAGNOSIS — I5032 Chronic diastolic (congestive) heart failure: Secondary | ICD-10-CM | POA: Diagnosis not present

## 2017-10-05 DIAGNOSIS — M6281 Muscle weakness (generalized): Secondary | ICD-10-CM | POA: Diagnosis not present

## 2017-10-05 DIAGNOSIS — R2689 Other abnormalities of gait and mobility: Secondary | ICD-10-CM | POA: Diagnosis not present

## 2017-10-06 DIAGNOSIS — M6281 Muscle weakness (generalized): Secondary | ICD-10-CM | POA: Diagnosis not present

## 2017-10-06 DIAGNOSIS — R2689 Other abnormalities of gait and mobility: Secondary | ICD-10-CM | POA: Diagnosis not present

## 2017-10-07 DIAGNOSIS — M6281 Muscle weakness (generalized): Secondary | ICD-10-CM | POA: Diagnosis not present

## 2017-10-07 DIAGNOSIS — R2689 Other abnormalities of gait and mobility: Secondary | ICD-10-CM | POA: Diagnosis not present

## 2017-10-10 DIAGNOSIS — M6281 Muscle weakness (generalized): Secondary | ICD-10-CM | POA: Diagnosis not present

## 2017-10-10 DIAGNOSIS — R2689 Other abnormalities of gait and mobility: Secondary | ICD-10-CM | POA: Diagnosis not present

## 2017-10-11 DIAGNOSIS — R2689 Other abnormalities of gait and mobility: Secondary | ICD-10-CM | POA: Diagnosis not present

## 2017-10-11 DIAGNOSIS — M6281 Muscle weakness (generalized): Secondary | ICD-10-CM | POA: Diagnosis not present

## 2017-10-12 DIAGNOSIS — R2689 Other abnormalities of gait and mobility: Secondary | ICD-10-CM | POA: Diagnosis not present

## 2017-10-12 DIAGNOSIS — M6281 Muscle weakness (generalized): Secondary | ICD-10-CM | POA: Diagnosis not present

## 2017-10-13 DIAGNOSIS — M6281 Muscle weakness (generalized): Secondary | ICD-10-CM | POA: Diagnosis not present

## 2017-10-13 DIAGNOSIS — R2689 Other abnormalities of gait and mobility: Secondary | ICD-10-CM | POA: Diagnosis not present

## 2017-10-14 DIAGNOSIS — M6281 Muscle weakness (generalized): Secondary | ICD-10-CM | POA: Diagnosis not present

## 2017-10-14 DIAGNOSIS — R2689 Other abnormalities of gait and mobility: Secondary | ICD-10-CM | POA: Diagnosis not present

## 2017-10-16 IMAGING — DX DG CHEST 2V
2 series · 2 of 2 positions shown · non-contrast
Comparison: [facility] [HOSPITAL] portable chest radiographs
04/26/2015 and earlier.

CLINICAL DATA: 85-year-old female with headache. Initial encounter.

EXAM:
CHEST  2 VIEW

[chest pa]
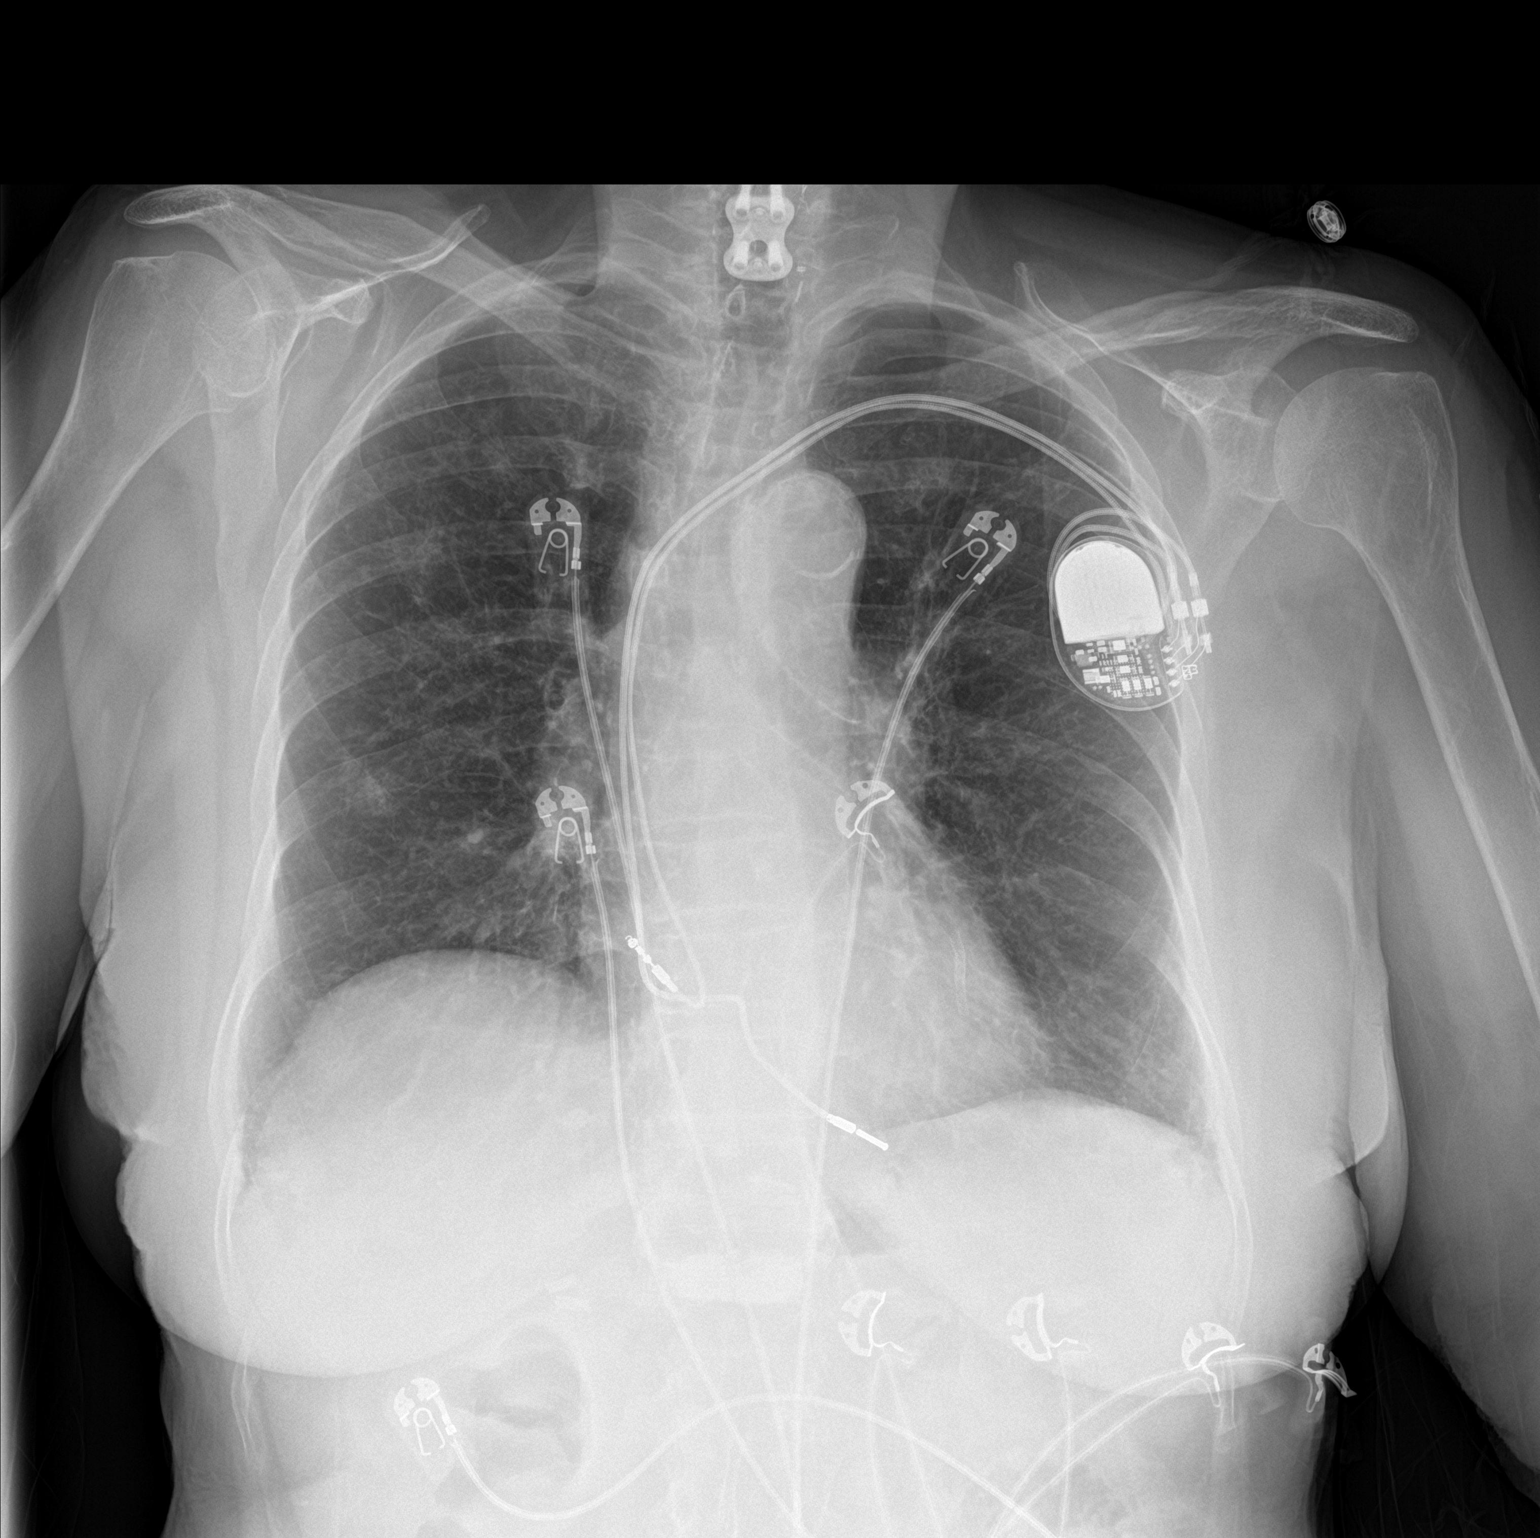

[chest lat]
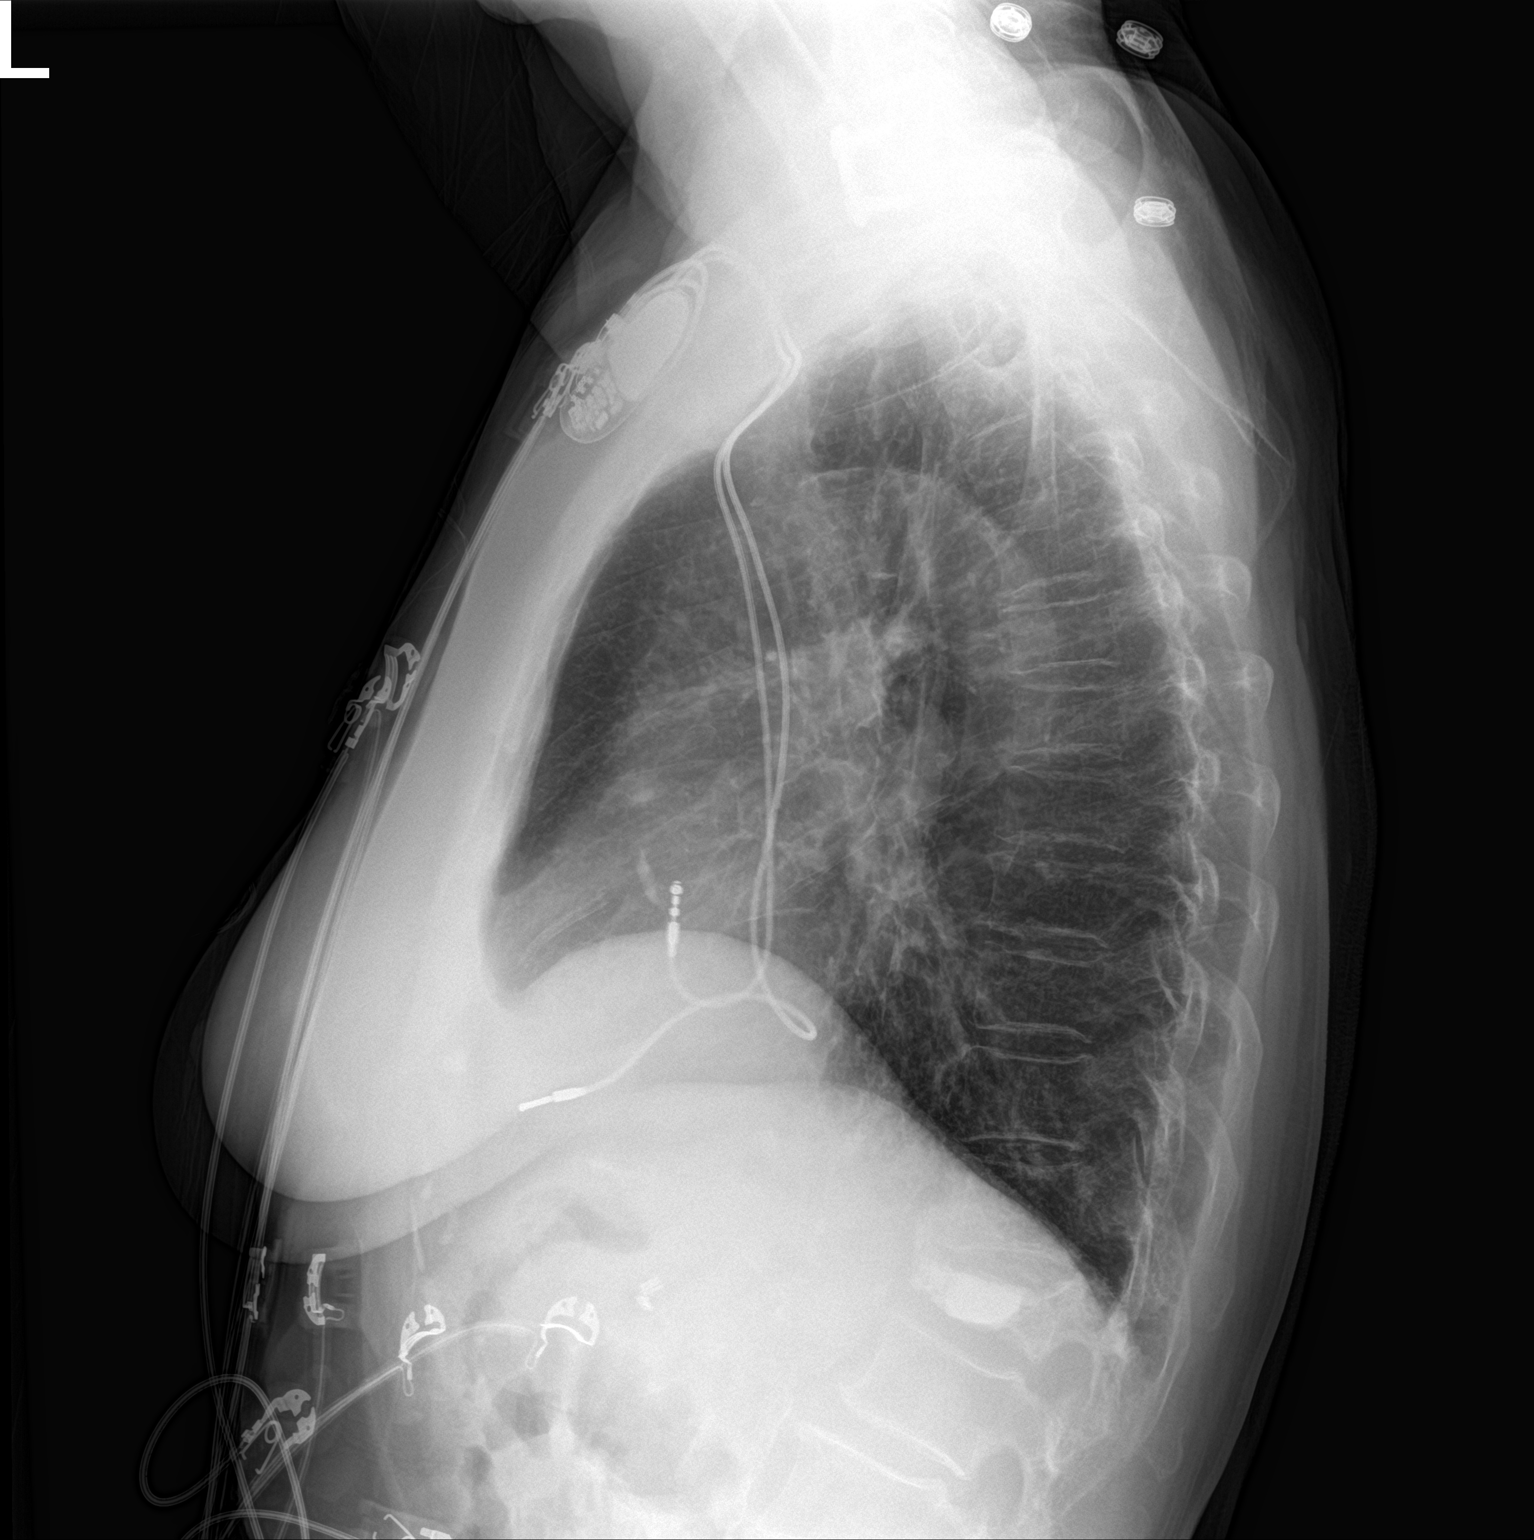

[2 of 2 positions shown; findings below may reference images not displayed]

FINDINGS: Stable left chest dual lead cardiac pacemaker. Normal cardiac size
and mediastinal contours. Calcified aortic atherosclerosis. Chronic
lower thoracic compression fracture with prior augmentation. Chronic
cervical ACDF hardware. Stable cholecystectomy clips.

Stable lung volumes with mild elevation of the right hemidiaphragm.
Occasional patchy probable parenchymal scarring in the right lung
appears stable since 6944. No pneumothorax, pulmonary edema, pleural
effusion or acute pulmonary opacity.
IMPRESSION: No acute cardiopulmonary abnormality.

Calcified aortic atherosclerosis.

## 2017-10-17 DIAGNOSIS — R2689 Other abnormalities of gait and mobility: Secondary | ICD-10-CM | POA: Diagnosis not present

## 2017-10-17 DIAGNOSIS — M6281 Muscle weakness (generalized): Secondary | ICD-10-CM | POA: Diagnosis not present

## 2017-10-18 DIAGNOSIS — M6281 Muscle weakness (generalized): Secondary | ICD-10-CM | POA: Diagnosis not present

## 2017-10-18 DIAGNOSIS — R2689 Other abnormalities of gait and mobility: Secondary | ICD-10-CM | POA: Diagnosis not present

## 2017-10-19 DIAGNOSIS — M6281 Muscle weakness (generalized): Secondary | ICD-10-CM | POA: Diagnosis not present

## 2017-10-19 DIAGNOSIS — R2689 Other abnormalities of gait and mobility: Secondary | ICD-10-CM | POA: Diagnosis not present

## 2017-10-20 DIAGNOSIS — M6281 Muscle weakness (generalized): Secondary | ICD-10-CM | POA: Diagnosis not present

## 2017-10-20 DIAGNOSIS — R2689 Other abnormalities of gait and mobility: Secondary | ICD-10-CM | POA: Diagnosis not present

## 2017-10-21 DIAGNOSIS — R2689 Other abnormalities of gait and mobility: Secondary | ICD-10-CM | POA: Diagnosis not present

## 2017-10-21 DIAGNOSIS — M6281 Muscle weakness (generalized): Secondary | ICD-10-CM | POA: Diagnosis not present

## 2017-10-24 DIAGNOSIS — R2689 Other abnormalities of gait and mobility: Secondary | ICD-10-CM | POA: Diagnosis not present

## 2017-10-24 DIAGNOSIS — M6281 Muscle weakness (generalized): Secondary | ICD-10-CM | POA: Diagnosis not present

## 2017-10-25 DIAGNOSIS — M6281 Muscle weakness (generalized): Secondary | ICD-10-CM | POA: Diagnosis not present

## 2017-10-25 DIAGNOSIS — R2689 Other abnormalities of gait and mobility: Secondary | ICD-10-CM | POA: Diagnosis not present

## 2017-10-27 DIAGNOSIS — M6281 Muscle weakness (generalized): Secondary | ICD-10-CM | POA: Diagnosis not present

## 2017-10-27 DIAGNOSIS — R2689 Other abnormalities of gait and mobility: Secondary | ICD-10-CM | POA: Diagnosis not present

## 2017-10-28 DIAGNOSIS — R2689 Other abnormalities of gait and mobility: Secondary | ICD-10-CM | POA: Diagnosis not present

## 2017-10-28 DIAGNOSIS — M6281 Muscle weakness (generalized): Secondary | ICD-10-CM | POA: Diagnosis not present

## 2017-11-22 DIAGNOSIS — I5032 Chronic diastolic (congestive) heart failure: Secondary | ICD-10-CM | POA: Diagnosis not present

## 2018-01-15 DIAGNOSIS — I5032 Chronic diastolic (congestive) heart failure: Secondary | ICD-10-CM | POA: Diagnosis not present

## 2018-01-27 ENCOUNTER — Encounter: Payer: Medicare Other | Admitting: Internal Medicine

## 2018-02-09 ENCOUNTER — Ambulatory Visit (INDEPENDENT_AMBULATORY_CARE_PROVIDER_SITE_OTHER): Payer: Medicare Other | Admitting: Internal Medicine

## 2018-02-09 ENCOUNTER — Encounter: Payer: Self-pay | Admitting: Internal Medicine

## 2018-02-09 VITALS — BP 118/70 | HR 71 | Ht 64.0 in | Wt 128.0 lb

## 2018-02-09 DIAGNOSIS — I442 Atrioventricular block, complete: Secondary | ICD-10-CM

## 2018-02-09 NOTE — Patient Instructions (Signed)
Your physician recommends that you schedule a follow-up appointment in: 1 YEAR WITH DR Johney FrameALLRED AND 6 MONTHS DEVICE   Your physician recommends that you continue on your current medications as directed. Please refer to the Current Medication list given to you today.  Thank you for choosing [facility]!!

## 2018-02-09 NOTE — Progress Notes (Signed)
PCP: Richardean Chimera, MD Primary Cardiologist:  Dr Cynthia [name] Primary EP:  Dr Cynthia [name]  Cynthia [name] is a 84 y.o. female who presents today for routine electrophysiology followup.  Since last being seen in our clinic, the patient continues to decline.  She has dementia.  Today, she denies symptoms of palpitations, chest pain, shortness of breath,  lower extremity edema, dizziness, presyncope, or syncope.  The patient is otherwise without complaint today.   Past Medical History:  Diagnosis Date  . Arthritis   . Cataracts, bilateral   . Chronic diastolic heart failure (HCC)   . Complete heart block (HCC)    a. s/p Medtronic PPM  . Coronary atherosclerosis of native coronary artery    a. DES to LAD 2/05. b. DES to LAD 02/2013 for ISR.  Marland Kitchen Depression   . GERD (gastroesophageal reflux disease)   . Glaucoma   . Hyperlipidemia   . Hypothyroidism   . Ischemic cardiomyopathy    a. EF 45-50% by cath 03/01/13.  . Sarcoidosis    Past Surgical History:  Procedure Laterality Date  . ANTERIOR CERVICAL DISCECTOMY     Cervical fusion  . cataracts     bilateral  . COLONOSCOPY    . GALLBLADDER SURGERY    . LUMBAR LAMINECTOMY     L3 S1  . PACEMAKER INSERTION  2000, 2007   Medtronic  . PERCUTANEOUS CORONARY STENT INTERVENTION (PCI-S) Left 03/01/13  . PERCUTANEOUS CORONARY STENT INTERVENTION (PCI-S) N/A 03/01/2013   Procedure: PERCUTANEOUS CORONARY STENT INTERVENTION (PCI-S);  Surgeon: Cynthia Hazel, MD;  Location: [facility] CATH LAB;  Service: Cardiovascular;  Laterality: N/A;  . UPPER GASTROINTESTINAL ENDOSCOPY      ROS- all systems are reviewed and negative except as per HPI above  Current Outpatient Medications  Medication Sig Dispense Refill  . acetaminophen (TYLENOL) 650 MG CR tablet Take 650 mg by mouth at bedtime. *May take one tablet every 6 hours as needed for pain    . amoxicillin (AMOXIL) 500 MG capsule Take 500 mg by mouth 4 (four) times daily as needed.    Marland Kitchen atorvastatin  (LIPITOR) 20 MG tablet Take 20 mg by mouth daily.    Marland Kitchen buPROPion (WELLBUTRIN XL) 150 MG 24 hr tablet Take 150 mg by mouth daily.    . calcium-vitamin D (OSCAL 500/200 D-3) 500-200 MG-UNIT tablet Take 1 tablet by mouth 2 (two) times daily.    . Cholecalciferol (VITAMIN D-3) 1000 UNITS CAPS Take 1 capsule by mouth daily.    . clopidogrel (PLAVIX) 75 MG tablet TAKE ONE TABLET BY MOUTH DAILY WITH BREAKFAST 30 tablet 6  . docusate sodium (COLACE) 100 MG capsule Take 200 mg by mouth daily.     Marland Kitchen donepezil (ARICEPT) 10 MG tablet Take 10 mg by mouth at bedtime.    . dorzolamide-timolol (COSOPT) 22.3-6.8 MG/ML ophthalmic solution Place 1 drop into the left eye 2 (two) times daily.     . DULoxetine (CYMBALTA) 60 MG capsule Take 60 mg by mouth daily.    . furosemide (LASIX) 20 MG tablet Take 20 mg by mouth daily.    Marland Kitchen HYDROcodone-acetaminophen (NORCO/VICODIN) 5-325 MG tablet Take 1 tablet by mouth every 8 (eight) hours as needed for moderate pain.    Marland Kitchen levothyroxine (SYNTHROID, LEVOTHROID) 75 MCG tablet Take 75 mcg by mouth daily before breakfast.    . meloxicam (MOBIC) 7.5 MG tablet Take 7.5 mg by mouth daily.    . Multiple Vitamin (MULTIVITAMIN WITH MINERALS) TABS tablet Take  1 tablet by mouth daily.    . mupirocin ointment (BACTROBAN) 2 % Place 1 application into the nose 2 (two) times daily. 22 g 0  . pantoprazole (PROTONIX) 20 MG tablet Take 20 mg by mouth daily.    . potassium chloride SA (K-DUR,KLOR-CON) 20 MEQ tablet Take 20 mEq by mouth daily.    . ranitidine (ZANTAC) 150 MG tablet Take 150 mg by mouth at bedtime.     Marland Kitchen. tiZANidine (ZANAFLEX) 2 MG tablet Take 2 mg by mouth every 8 (eight) hours as needed for muscle spasms.     No current facility-administered medications for this visit.     Physical Exam: Vitals:   02/09/18 1507  BP: 118/70  Pulse: 71  SpO2: 98%  Weight: 128 lb (58.1 kg)  Height: 5\' 4"  (1.626 m)    GEN- The patient is elderly appearing, [name] Head- normocephalic,  atraumatic Eyes-  Sclera clear, conjunctiva pink Ears- hearing intact Oropharynx- clear Lungs- Clear to ausculation bilaterally, normal work of breathing Chest- pacemaker pocket is well healed Heart- Regular rate and rhythm, no murmurs, rubs or gallops, PMI not laterally displaced GI- soft, NT, ND, + BS Extremities- no clubbing, cyanosis, or edema  Pacemaker interrogation- reviewed in detail today,  See PACEART report  ekg tracing ordered today is personally reviewed and shows sinus with V pacing  Assessment and Plan:  1. Symptomatic  complete heart block Normal pacemaker function See Pace Art report No changes today Rare chronic atrial lead noise No plans for revision  2. CAD Stable No change required today  Return to see device nurse in 6 months I will see in a year  Cynthia RangeJames Ulises Wolfinger MD, [facility] 02/09/2018 3:29 PM

## 2018-02-17 LAB — CUP PACEART INCLINIC DEVICE CHECK
Date Time Interrogation Session: 20190222135428
Implantable Lead Implant Date: 20000908
Implantable Lead Implant Date: 20000908
Implantable Lead Location: 753860
Implantable Lead Model: 4285
Implantable Pulse Generator Implant Date: 20090630
MDC IDC LEAD LOCATION: 753859
MDC IDC LEAD SERIAL: 282090

## 2018-03-04 DIAGNOSIS — I5032 Chronic diastolic (congestive) heart failure: Secondary | ICD-10-CM | POA: Diagnosis not present

## 2018-03-14 DIAGNOSIS — M6281 Muscle weakness (generalized): Secondary | ICD-10-CM | POA: Diagnosis not present

## 2018-03-14 DIAGNOSIS — R296 Repeated falls: Secondary | ICD-10-CM | POA: Diagnosis not present

## 2018-03-14 DIAGNOSIS — R2689 Other abnormalities of gait and mobility: Secondary | ICD-10-CM | POA: Diagnosis not present

## 2018-03-15 DIAGNOSIS — R296 Repeated falls: Secondary | ICD-10-CM | POA: Diagnosis not present

## 2018-03-15 DIAGNOSIS — M6281 Muscle weakness (generalized): Secondary | ICD-10-CM | POA: Diagnosis not present

## 2018-03-15 DIAGNOSIS — R2689 Other abnormalities of gait and mobility: Secondary | ICD-10-CM | POA: Diagnosis not present

## 2018-03-16 DIAGNOSIS — R296 Repeated falls: Secondary | ICD-10-CM | POA: Diagnosis not present

## 2018-03-16 DIAGNOSIS — R2689 Other abnormalities of gait and mobility: Secondary | ICD-10-CM | POA: Diagnosis not present

## 2018-03-16 DIAGNOSIS — M6281 Muscle weakness (generalized): Secondary | ICD-10-CM | POA: Diagnosis not present

## 2018-03-17 DIAGNOSIS — R2689 Other abnormalities of gait and mobility: Secondary | ICD-10-CM | POA: Diagnosis not present

## 2018-03-17 DIAGNOSIS — R296 Repeated falls: Secondary | ICD-10-CM | POA: Diagnosis not present

## 2018-03-17 DIAGNOSIS — M6281 Muscle weakness (generalized): Secondary | ICD-10-CM | POA: Diagnosis not present

## 2018-03-18 DIAGNOSIS — M6281 Muscle weakness (generalized): Secondary | ICD-10-CM | POA: Diagnosis not present

## 2018-03-18 DIAGNOSIS — R296 Repeated falls: Secondary | ICD-10-CM | POA: Diagnosis not present

## 2018-03-18 DIAGNOSIS — R2689 Other abnormalities of gait and mobility: Secondary | ICD-10-CM | POA: Diagnosis not present

## 2018-03-20 DIAGNOSIS — R2689 Other abnormalities of gait and mobility: Secondary | ICD-10-CM | POA: Diagnosis not present

## 2018-03-20 DIAGNOSIS — R296 Repeated falls: Secondary | ICD-10-CM | POA: Diagnosis not present

## 2018-03-20 DIAGNOSIS — M6281 Muscle weakness (generalized): Secondary | ICD-10-CM | POA: Diagnosis not present

## 2018-03-21 DIAGNOSIS — M6281 Muscle weakness (generalized): Secondary | ICD-10-CM | POA: Diagnosis not present

## 2018-03-21 DIAGNOSIS — R2689 Other abnormalities of gait and mobility: Secondary | ICD-10-CM | POA: Diagnosis not present

## 2018-03-21 DIAGNOSIS — R296 Repeated falls: Secondary | ICD-10-CM | POA: Diagnosis not present

## 2018-03-22 DIAGNOSIS — M6281 Muscle weakness (generalized): Secondary | ICD-10-CM | POA: Diagnosis not present

## 2018-03-22 DIAGNOSIS — R296 Repeated falls: Secondary | ICD-10-CM | POA: Diagnosis not present

## 2018-03-22 DIAGNOSIS — R2689 Other abnormalities of gait and mobility: Secondary | ICD-10-CM | POA: Diagnosis not present

## 2018-03-23 DIAGNOSIS — M6281 Muscle weakness (generalized): Secondary | ICD-10-CM | POA: Diagnosis not present

## 2018-03-23 DIAGNOSIS — R296 Repeated falls: Secondary | ICD-10-CM | POA: Diagnosis not present

## 2018-03-23 DIAGNOSIS — R2689 Other abnormalities of gait and mobility: Secondary | ICD-10-CM | POA: Diagnosis not present

## 2018-03-24 DIAGNOSIS — R296 Repeated falls: Secondary | ICD-10-CM | POA: Diagnosis not present

## 2018-03-24 DIAGNOSIS — R2689 Other abnormalities of gait and mobility: Secondary | ICD-10-CM | POA: Diagnosis not present

## 2018-03-24 DIAGNOSIS — M6281 Muscle weakness (generalized): Secondary | ICD-10-CM | POA: Diagnosis not present

## 2018-03-27 DIAGNOSIS — R296 Repeated falls: Secondary | ICD-10-CM | POA: Diagnosis not present

## 2018-03-27 DIAGNOSIS — R2689 Other abnormalities of gait and mobility: Secondary | ICD-10-CM | POA: Diagnosis not present

## 2018-03-27 DIAGNOSIS — M6281 Muscle weakness (generalized): Secondary | ICD-10-CM | POA: Diagnosis not present

## 2018-03-28 DIAGNOSIS — M6281 Muscle weakness (generalized): Secondary | ICD-10-CM | POA: Diagnosis not present

## 2018-03-28 DIAGNOSIS — R296 Repeated falls: Secondary | ICD-10-CM | POA: Diagnosis not present

## 2018-03-28 DIAGNOSIS — R2689 Other abnormalities of gait and mobility: Secondary | ICD-10-CM | POA: Diagnosis not present

## 2018-03-29 DIAGNOSIS — R2689 Other abnormalities of gait and mobility: Secondary | ICD-10-CM | POA: Diagnosis not present

## 2018-03-29 DIAGNOSIS — M6281 Muscle weakness (generalized): Secondary | ICD-10-CM | POA: Diagnosis not present

## 2018-03-29 DIAGNOSIS — R296 Repeated falls: Secondary | ICD-10-CM | POA: Diagnosis not present

## 2018-03-30 DIAGNOSIS — M6281 Muscle weakness (generalized): Secondary | ICD-10-CM | POA: Diagnosis not present

## 2018-03-30 DIAGNOSIS — R2689 Other abnormalities of gait and mobility: Secondary | ICD-10-CM | POA: Diagnosis not present

## 2018-03-30 DIAGNOSIS — R296 Repeated falls: Secondary | ICD-10-CM | POA: Diagnosis not present

## 2018-03-31 DIAGNOSIS — R296 Repeated falls: Secondary | ICD-10-CM | POA: Diagnosis not present

## 2018-03-31 DIAGNOSIS — R2689 Other abnormalities of gait and mobility: Secondary | ICD-10-CM | POA: Diagnosis not present

## 2018-03-31 DIAGNOSIS — M6281 Muscle weakness (generalized): Secondary | ICD-10-CM | POA: Diagnosis not present

## 2018-04-03 DIAGNOSIS — R296 Repeated falls: Secondary | ICD-10-CM | POA: Diagnosis not present

## 2018-04-03 DIAGNOSIS — M6281 Muscle weakness (generalized): Secondary | ICD-10-CM | POA: Diagnosis not present

## 2018-04-03 DIAGNOSIS — R2689 Other abnormalities of gait and mobility: Secondary | ICD-10-CM | POA: Diagnosis not present

## 2018-04-04 DIAGNOSIS — M6281 Muscle weakness (generalized): Secondary | ICD-10-CM | POA: Diagnosis not present

## 2018-04-04 DIAGNOSIS — R2689 Other abnormalities of gait and mobility: Secondary | ICD-10-CM | POA: Diagnosis not present

## 2018-04-04 DIAGNOSIS — R296 Repeated falls: Secondary | ICD-10-CM | POA: Diagnosis not present

## 2018-04-05 DIAGNOSIS — R2689 Other abnormalities of gait and mobility: Secondary | ICD-10-CM | POA: Diagnosis not present

## 2018-04-05 DIAGNOSIS — R296 Repeated falls: Secondary | ICD-10-CM | POA: Diagnosis not present

## 2018-04-05 DIAGNOSIS — M6281 Muscle weakness (generalized): Secondary | ICD-10-CM | POA: Diagnosis not present

## 2018-04-06 DIAGNOSIS — R296 Repeated falls: Secondary | ICD-10-CM | POA: Diagnosis not present

## 2018-04-06 DIAGNOSIS — R2689 Other abnormalities of gait and mobility: Secondary | ICD-10-CM | POA: Diagnosis not present

## 2018-04-06 DIAGNOSIS — M6281 Muscle weakness (generalized): Secondary | ICD-10-CM | POA: Diagnosis not present

## 2018-04-07 DIAGNOSIS — M6281 Muscle weakness (generalized): Secondary | ICD-10-CM | POA: Diagnosis not present

## 2018-04-07 DIAGNOSIS — R296 Repeated falls: Secondary | ICD-10-CM | POA: Diagnosis not present

## 2018-04-07 DIAGNOSIS — R2689 Other abnormalities of gait and mobility: Secondary | ICD-10-CM | POA: Diagnosis not present

## 2018-04-08 DIAGNOSIS — M6281 Muscle weakness (generalized): Secondary | ICD-10-CM | POA: Diagnosis not present

## 2018-04-08 DIAGNOSIS — R296 Repeated falls: Secondary | ICD-10-CM | POA: Diagnosis not present

## 2018-04-08 DIAGNOSIS — R2689 Other abnormalities of gait and mobility: Secondary | ICD-10-CM | POA: Diagnosis not present

## 2018-04-10 DIAGNOSIS — M6281 Muscle weakness (generalized): Secondary | ICD-10-CM | POA: Diagnosis not present

## 2018-04-10 DIAGNOSIS — R296 Repeated falls: Secondary | ICD-10-CM | POA: Diagnosis not present

## 2018-04-10 DIAGNOSIS — R2689 Other abnormalities of gait and mobility: Secondary | ICD-10-CM | POA: Diagnosis not present

## 2018-04-11 DIAGNOSIS — M6281 Muscle weakness (generalized): Secondary | ICD-10-CM | POA: Diagnosis not present

## 2018-04-11 DIAGNOSIS — R2689 Other abnormalities of gait and mobility: Secondary | ICD-10-CM | POA: Diagnosis not present

## 2018-04-11 DIAGNOSIS — R296 Repeated falls: Secondary | ICD-10-CM | POA: Diagnosis not present

## 2018-04-12 DIAGNOSIS — R2689 Other abnormalities of gait and mobility: Secondary | ICD-10-CM | POA: Diagnosis not present

## 2018-04-12 DIAGNOSIS — R296 Repeated falls: Secondary | ICD-10-CM | POA: Diagnosis not present

## 2018-04-12 DIAGNOSIS — M6281 Muscle weakness (generalized): Secondary | ICD-10-CM | POA: Diagnosis not present

## 2018-04-13 DIAGNOSIS — R2689 Other abnormalities of gait and mobility: Secondary | ICD-10-CM | POA: Diagnosis not present

## 2018-04-13 DIAGNOSIS — R296 Repeated falls: Secondary | ICD-10-CM | POA: Diagnosis not present

## 2018-04-13 DIAGNOSIS — M6281 Muscle weakness (generalized): Secondary | ICD-10-CM | POA: Diagnosis not present

## 2018-04-14 DIAGNOSIS — R296 Repeated falls: Secondary | ICD-10-CM | POA: Diagnosis not present

## 2018-04-14 DIAGNOSIS — R2689 Other abnormalities of gait and mobility: Secondary | ICD-10-CM | POA: Diagnosis not present

## 2018-04-14 DIAGNOSIS — M6281 Muscle weakness (generalized): Secondary | ICD-10-CM | POA: Diagnosis not present

## 2018-04-17 DIAGNOSIS — R2689 Other abnormalities of gait and mobility: Secondary | ICD-10-CM | POA: Diagnosis not present

## 2018-04-17 DIAGNOSIS — M6281 Muscle weakness (generalized): Secondary | ICD-10-CM | POA: Diagnosis not present

## 2018-04-17 DIAGNOSIS — R296 Repeated falls: Secondary | ICD-10-CM | POA: Diagnosis not present

## 2018-04-18 DIAGNOSIS — R296 Repeated falls: Secondary | ICD-10-CM | POA: Diagnosis not present

## 2018-04-18 DIAGNOSIS — M6281 Muscle weakness (generalized): Secondary | ICD-10-CM | POA: Diagnosis not present

## 2018-04-18 DIAGNOSIS — R2689 Other abnormalities of gait and mobility: Secondary | ICD-10-CM | POA: Diagnosis not present

## 2018-04-19 DIAGNOSIS — M6281 Muscle weakness (generalized): Secondary | ICD-10-CM | POA: Diagnosis not present

## 2018-04-19 DIAGNOSIS — R2689 Other abnormalities of gait and mobility: Secondary | ICD-10-CM | POA: Diagnosis not present

## 2018-04-19 DIAGNOSIS — R296 Repeated falls: Secondary | ICD-10-CM | POA: Diagnosis not present

## 2018-04-20 DIAGNOSIS — R296 Repeated falls: Secondary | ICD-10-CM | POA: Diagnosis not present

## 2018-04-20 DIAGNOSIS — M6281 Muscle weakness (generalized): Secondary | ICD-10-CM | POA: Diagnosis not present

## 2018-04-20 DIAGNOSIS — H401131 Primary open-angle glaucoma, bilateral, mild stage: Secondary | ICD-10-CM | POA: Diagnosis not present

## 2018-04-20 DIAGNOSIS — R2689 Other abnormalities of gait and mobility: Secondary | ICD-10-CM | POA: Diagnosis not present

## 2018-04-21 DIAGNOSIS — R296 Repeated falls: Secondary | ICD-10-CM | POA: Diagnosis not present

## 2018-04-21 DIAGNOSIS — M6281 Muscle weakness (generalized): Secondary | ICD-10-CM | POA: Diagnosis not present

## 2018-04-21 DIAGNOSIS — R2689 Other abnormalities of gait and mobility: Secondary | ICD-10-CM | POA: Diagnosis not present

## 2018-04-24 DIAGNOSIS — M6281 Muscle weakness (generalized): Secondary | ICD-10-CM | POA: Diagnosis not present

## 2018-04-24 DIAGNOSIS — R296 Repeated falls: Secondary | ICD-10-CM | POA: Diagnosis not present

## 2018-04-24 DIAGNOSIS — R2689 Other abnormalities of gait and mobility: Secondary | ICD-10-CM | POA: Diagnosis not present

## 2018-04-25 DIAGNOSIS — R2689 Other abnormalities of gait and mobility: Secondary | ICD-10-CM | POA: Diagnosis not present

## 2018-04-25 DIAGNOSIS — M6281 Muscle weakness (generalized): Secondary | ICD-10-CM | POA: Diagnosis not present

## 2018-04-25 DIAGNOSIS — R296 Repeated falls: Secondary | ICD-10-CM | POA: Diagnosis not present

## 2018-04-26 DIAGNOSIS — M6281 Muscle weakness (generalized): Secondary | ICD-10-CM | POA: Diagnosis not present

## 2018-04-26 DIAGNOSIS — R296 Repeated falls: Secondary | ICD-10-CM | POA: Diagnosis not present

## 2018-04-26 DIAGNOSIS — R2689 Other abnormalities of gait and mobility: Secondary | ICD-10-CM | POA: Diagnosis not present

## 2018-04-27 DIAGNOSIS — R2689 Other abnormalities of gait and mobility: Secondary | ICD-10-CM | POA: Diagnosis not present

## 2018-04-27 DIAGNOSIS — R296 Repeated falls: Secondary | ICD-10-CM | POA: Diagnosis not present

## 2018-04-27 DIAGNOSIS — M6281 Muscle weakness (generalized): Secondary | ICD-10-CM | POA: Diagnosis not present

## 2018-04-28 DIAGNOSIS — R2689 Other abnormalities of gait and mobility: Secondary | ICD-10-CM | POA: Diagnosis not present

## 2018-04-28 DIAGNOSIS — R296 Repeated falls: Secondary | ICD-10-CM | POA: Diagnosis not present

## 2018-04-28 DIAGNOSIS — M6281 Muscle weakness (generalized): Secondary | ICD-10-CM | POA: Diagnosis not present

## 2018-05-01 DIAGNOSIS — R296 Repeated falls: Secondary | ICD-10-CM | POA: Diagnosis not present

## 2018-05-01 DIAGNOSIS — R2689 Other abnormalities of gait and mobility: Secondary | ICD-10-CM | POA: Diagnosis not present

## 2018-05-01 DIAGNOSIS — M6281 Muscle weakness (generalized): Secondary | ICD-10-CM | POA: Diagnosis not present

## 2018-05-03 DIAGNOSIS — M6281 Muscle weakness (generalized): Secondary | ICD-10-CM | POA: Diagnosis not present

## 2018-05-03 DIAGNOSIS — R296 Repeated falls: Secondary | ICD-10-CM | POA: Diagnosis not present

## 2018-05-03 DIAGNOSIS — R2689 Other abnormalities of gait and mobility: Secondary | ICD-10-CM | POA: Diagnosis not present

## 2018-05-04 DIAGNOSIS — R2689 Other abnormalities of gait and mobility: Secondary | ICD-10-CM | POA: Diagnosis not present

## 2018-05-04 DIAGNOSIS — R296 Repeated falls: Secondary | ICD-10-CM | POA: Diagnosis not present

## 2018-05-04 DIAGNOSIS — M6281 Muscle weakness (generalized): Secondary | ICD-10-CM | POA: Diagnosis not present

## 2018-05-05 DIAGNOSIS — M6281 Muscle weakness (generalized): Secondary | ICD-10-CM | POA: Diagnosis not present

## 2018-05-05 DIAGNOSIS — R2689 Other abnormalities of gait and mobility: Secondary | ICD-10-CM | POA: Diagnosis not present

## 2018-05-05 DIAGNOSIS — R296 Repeated falls: Secondary | ICD-10-CM | POA: Diagnosis not present

## 2018-05-08 DIAGNOSIS — M6281 Muscle weakness (generalized): Secondary | ICD-10-CM | POA: Diagnosis not present

## 2018-05-08 DIAGNOSIS — R296 Repeated falls: Secondary | ICD-10-CM | POA: Diagnosis not present

## 2018-05-08 DIAGNOSIS — R2689 Other abnormalities of gait and mobility: Secondary | ICD-10-CM | POA: Diagnosis not present

## 2018-05-09 DIAGNOSIS — R296 Repeated falls: Secondary | ICD-10-CM | POA: Diagnosis not present

## 2018-05-09 DIAGNOSIS — I5032 Chronic diastolic (congestive) heart failure: Secondary | ICD-10-CM | POA: Diagnosis not present

## 2018-05-09 DIAGNOSIS — R2689 Other abnormalities of gait and mobility: Secondary | ICD-10-CM | POA: Diagnosis not present

## 2018-05-09 DIAGNOSIS — M6281 Muscle weakness (generalized): Secondary | ICD-10-CM | POA: Diagnosis not present

## 2018-05-10 DIAGNOSIS — R2689 Other abnormalities of gait and mobility: Secondary | ICD-10-CM | POA: Diagnosis not present

## 2018-05-10 DIAGNOSIS — M6281 Muscle weakness (generalized): Secondary | ICD-10-CM | POA: Diagnosis not present

## 2018-05-10 DIAGNOSIS — R296 Repeated falls: Secondary | ICD-10-CM | POA: Diagnosis not present

## 2018-05-11 DIAGNOSIS — R296 Repeated falls: Secondary | ICD-10-CM | POA: Diagnosis not present

## 2018-05-11 DIAGNOSIS — M6281 Muscle weakness (generalized): Secondary | ICD-10-CM | POA: Diagnosis not present

## 2018-05-11 DIAGNOSIS — R2689 Other abnormalities of gait and mobility: Secondary | ICD-10-CM | POA: Diagnosis not present

## 2018-05-12 DIAGNOSIS — M6281 Muscle weakness (generalized): Secondary | ICD-10-CM | POA: Diagnosis not present

## 2018-05-12 DIAGNOSIS — R296 Repeated falls: Secondary | ICD-10-CM | POA: Diagnosis not present

## 2018-05-12 DIAGNOSIS — R2689 Other abnormalities of gait and mobility: Secondary | ICD-10-CM | POA: Diagnosis not present

## 2018-05-26 DIAGNOSIS — R350 Frequency of micturition: Secondary | ICD-10-CM | POA: Diagnosis not present

## 2018-05-26 DIAGNOSIS — R300 Dysuria: Secondary | ICD-10-CM | POA: Diagnosis not present

## 2018-05-31 DIAGNOSIS — N189 Chronic kidney disease, unspecified: Secondary | ICD-10-CM | POA: Diagnosis not present

## 2018-05-31 DIAGNOSIS — N179 Acute kidney failure, unspecified: Secondary | ICD-10-CM | POA: Diagnosis not present

## 2018-05-31 DIAGNOSIS — E785 Hyperlipidemia, unspecified: Secondary | ICD-10-CM | POA: Diagnosis not present

## 2018-05-31 DIAGNOSIS — Z79899 Other long term (current) drug therapy: Secondary | ICD-10-CM | POA: Diagnosis not present

## 2018-05-31 DIAGNOSIS — R2689 Other abnormalities of gait and mobility: Secondary | ICD-10-CM | POA: Diagnosis not present

## 2018-05-31 DIAGNOSIS — N289 Disorder of kidney and ureter, unspecified: Secondary | ICD-10-CM | POA: Diagnosis not present

## 2018-05-31 DIAGNOSIS — I509 Heart failure, unspecified: Secondary | ICD-10-CM | POA: Diagnosis not present

## 2018-05-31 DIAGNOSIS — Z8673 Personal history of transient ischemic attack (TIA), and cerebral infarction without residual deficits: Secondary | ICD-10-CM | POA: Diagnosis not present

## 2018-05-31 DIAGNOSIS — R29714 NIHSS score 14: Secondary | ICD-10-CM | POA: Diagnosis not present

## 2018-05-31 DIAGNOSIS — G8191 Hemiplegia, unspecified affecting right dominant side: Secondary | ICD-10-CM | POA: Diagnosis not present

## 2018-05-31 DIAGNOSIS — H409 Unspecified glaucoma: Secondary | ICD-10-CM | POA: Diagnosis not present

## 2018-05-31 DIAGNOSIS — Z66 Do not resuscitate: Secondary | ICD-10-CM | POA: Diagnosis not present

## 2018-05-31 DIAGNOSIS — I089 Rheumatic multiple valve disease, unspecified: Secondary | ICD-10-CM | POA: Diagnosis not present

## 2018-05-31 DIAGNOSIS — R29810 Facial weakness: Secondary | ICD-10-CM | POA: Diagnosis not present

## 2018-05-31 DIAGNOSIS — R29818 Other symptoms and signs involving the nervous system: Secondary | ICD-10-CM | POA: Diagnosis not present

## 2018-05-31 DIAGNOSIS — R2681 Unsteadiness on feet: Secondary | ICD-10-CM | POA: Diagnosis not present

## 2018-05-31 DIAGNOSIS — Z7902 Long term (current) use of antithrombotics/antiplatelets: Secondary | ICD-10-CM | POA: Diagnosis not present

## 2018-05-31 DIAGNOSIS — J449 Chronic obstructive pulmonary disease, unspecified: Secondary | ICD-10-CM | POA: Diagnosis not present

## 2018-05-31 DIAGNOSIS — I2510 Atherosclerotic heart disease of native coronary artery without angina pectoris: Secondary | ICD-10-CM | POA: Diagnosis not present

## 2018-05-31 DIAGNOSIS — E039 Hypothyroidism, unspecified: Secondary | ICD-10-CM | POA: Diagnosis not present

## 2018-05-31 DIAGNOSIS — Z950 Presence of cardiac pacemaker: Secondary | ICD-10-CM | POA: Diagnosis not present

## 2018-05-31 DIAGNOSIS — I517 Cardiomegaly: Secondary | ICD-10-CM | POA: Diagnosis not present

## 2018-05-31 DIAGNOSIS — Z9181 History of falling: Secondary | ICD-10-CM | POA: Diagnosis not present

## 2018-05-31 DIAGNOSIS — R0902 Hypoxemia: Secondary | ICD-10-CM | POA: Diagnosis not present

## 2018-05-31 DIAGNOSIS — G9340 Encephalopathy, unspecified: Secondary | ICD-10-CM | POA: Diagnosis not present

## 2018-05-31 DIAGNOSIS — K219 Gastro-esophageal reflux disease without esophagitis: Secondary | ICD-10-CM | POA: Diagnosis not present

## 2018-05-31 DIAGNOSIS — I639 Cerebral infarction, unspecified: Secondary | ICD-10-CM | POA: Diagnosis not present

## 2018-05-31 DIAGNOSIS — M6281 Muscle weakness (generalized): Secondary | ICD-10-CM | POA: Diagnosis not present

## 2018-05-31 DIAGNOSIS — G309 Alzheimer's disease, unspecified: Secondary | ICD-10-CM | POA: Diagnosis not present

## 2018-05-31 DIAGNOSIS — I129 Hypertensive chronic kidney disease with stage 1 through stage 4 chronic kidney disease, or unspecified chronic kidney disease: Secondary | ICD-10-CM | POA: Diagnosis not present

## 2018-05-31 DIAGNOSIS — Z743 Need for continuous supervision: Secondary | ICD-10-CM | POA: Diagnosis not present

## 2018-05-31 DIAGNOSIS — Z888 Allergy status to other drugs, medicaments and biological substances status: Secondary | ICD-10-CM | POA: Diagnosis not present

## 2018-05-31 DIAGNOSIS — G301 Alzheimer's disease with late onset: Secondary | ICD-10-CM | POA: Diagnosis not present

## 2018-05-31 DIAGNOSIS — R569 Unspecified convulsions: Secondary | ICD-10-CM | POA: Diagnosis not present

## 2018-05-31 DIAGNOSIS — R69 Illness, unspecified: Secondary | ICD-10-CM | POA: Diagnosis not present

## 2018-06-03 DIAGNOSIS — R569 Unspecified convulsions: Secondary | ICD-10-CM | POA: Diagnosis not present

## 2018-06-03 DIAGNOSIS — N289 Disorder of kidney and ureter, unspecified: Secondary | ICD-10-CM | POA: Diagnosis not present

## 2018-06-03 DIAGNOSIS — M6281 Muscle weakness (generalized): Secondary | ICD-10-CM | POA: Diagnosis not present

## 2018-06-03 DIAGNOSIS — R2681 Unsteadiness on feet: Secondary | ICD-10-CM | POA: Diagnosis not present

## 2018-06-03 DIAGNOSIS — Z743 Need for continuous supervision: Secondary | ICD-10-CM | POA: Diagnosis not present

## 2018-06-03 DIAGNOSIS — G309 Alzheimer's disease, unspecified: Secondary | ICD-10-CM | POA: Diagnosis not present

## 2018-06-03 DIAGNOSIS — I5032 Chronic diastolic (congestive) heart failure: Secondary | ICD-10-CM | POA: Diagnosis not present

## 2018-06-03 DIAGNOSIS — Z9181 History of falling: Secondary | ICD-10-CM | POA: Diagnosis not present

## 2018-06-03 DIAGNOSIS — H409 Unspecified glaucoma: Secondary | ICD-10-CM | POA: Diagnosis not present

## 2018-06-03 DIAGNOSIS — Z888 Allergy status to other drugs, medicaments and biological substances status: Secondary | ICD-10-CM | POA: Diagnosis not present

## 2018-06-03 DIAGNOSIS — E785 Hyperlipidemia, unspecified: Secondary | ICD-10-CM | POA: Diagnosis not present

## 2018-06-03 DIAGNOSIS — R2689 Other abnormalities of gait and mobility: Secondary | ICD-10-CM | POA: Diagnosis not present

## 2018-06-04 DIAGNOSIS — I5032 Chronic diastolic (congestive) heart failure: Secondary | ICD-10-CM | POA: Diagnosis not present

## 2018-06-05 ENCOUNTER — Encounter: Payer: Self-pay | Admitting: Neurology

## 2018-06-13 IMAGING — US US CAROTID DUPLEX BILAT
1 series · 13 of 24 positions shown · non-contrast
Comparison: 06/06/2007

CLINICAL DATA: TIA, hallucinations, cephalgia, diploplia.
Hypertension, coronary disease.

EXAM:
BILATERAL CAROTID DUPLEX ULTRASOUND
TECHNIQUE: Gray scale imaging, color Doppler and duplex ultrasound was
performed of bilateral carotid and vertebral arteries in the neck.
TECHNIQUE: Quantification of carotid stenosis is based on velocity parameters
that correlate the residual internal carotid diameter with
NASCET-based stenosis levels, using the diameter of the distal
internal carotid lumen as the denominator for stenosis measurement.

[Series 1: us carotid duplex bilat · 0.05mm/px · 13 of 68 slices shown]
[im 1/68]
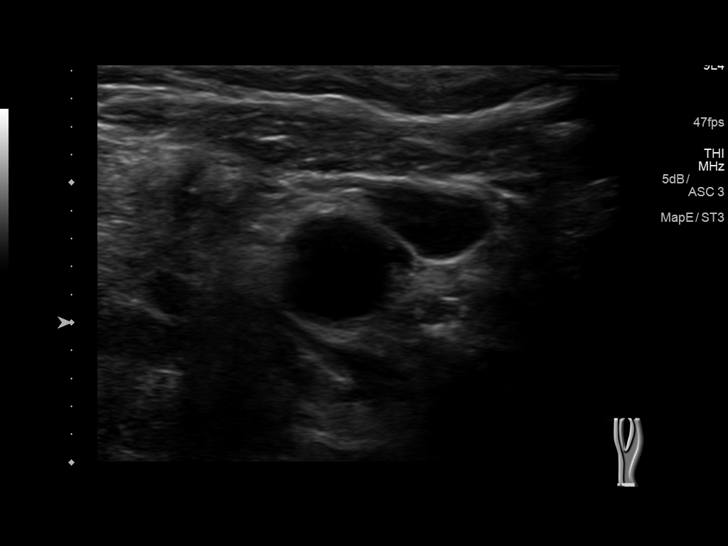
[im 6/68]
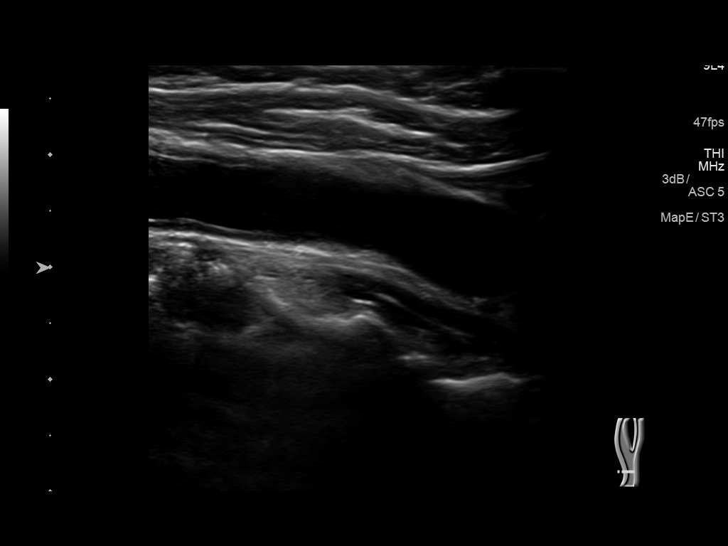
[im 12/68]
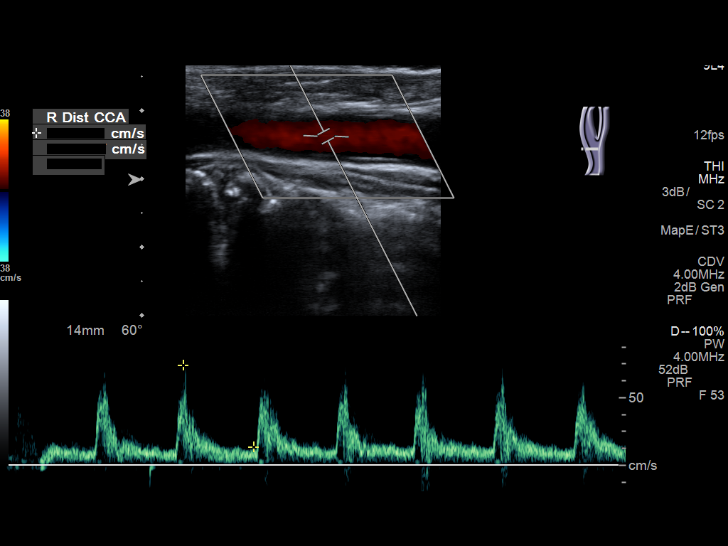
[im 18/68]
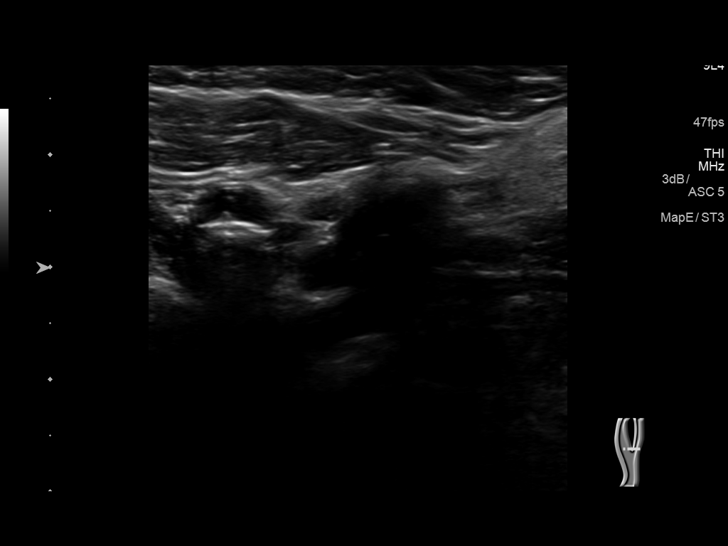
[im 24/68]
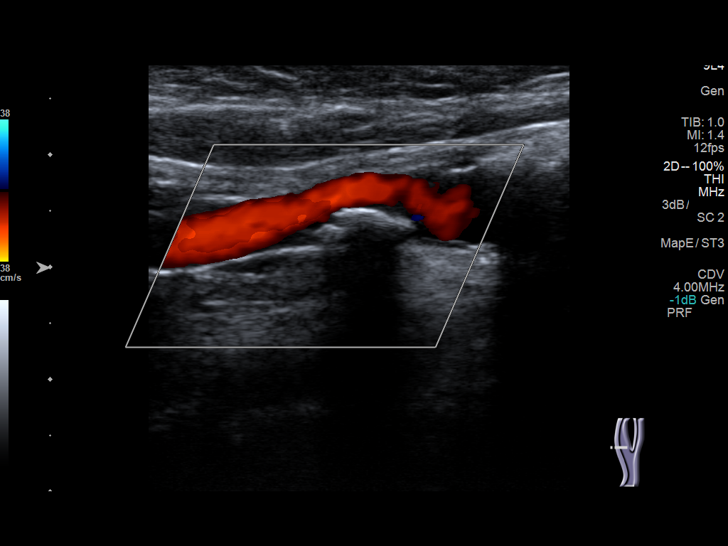
[im 30/68]
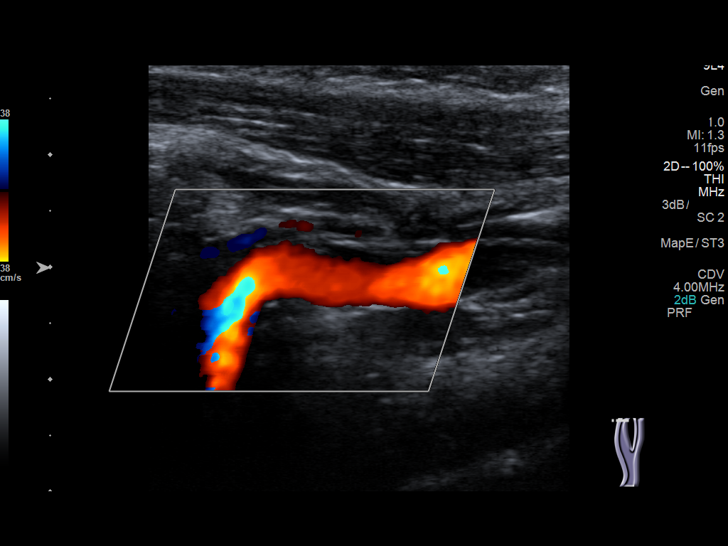
[im 35/68]
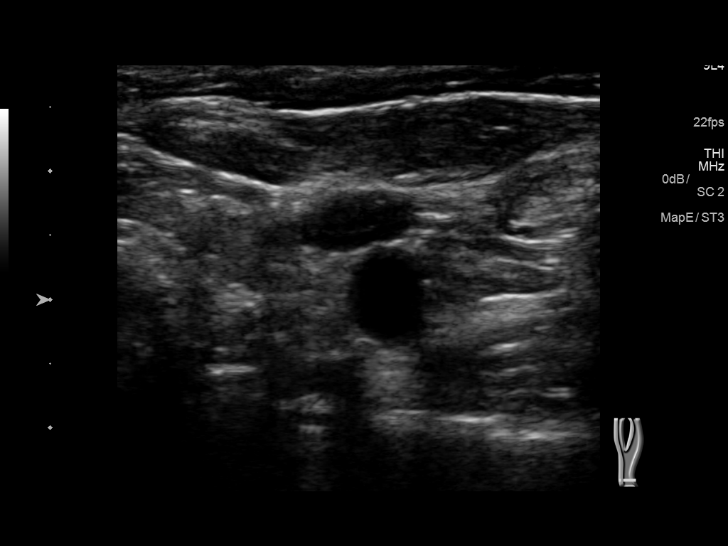
[im 38/68]
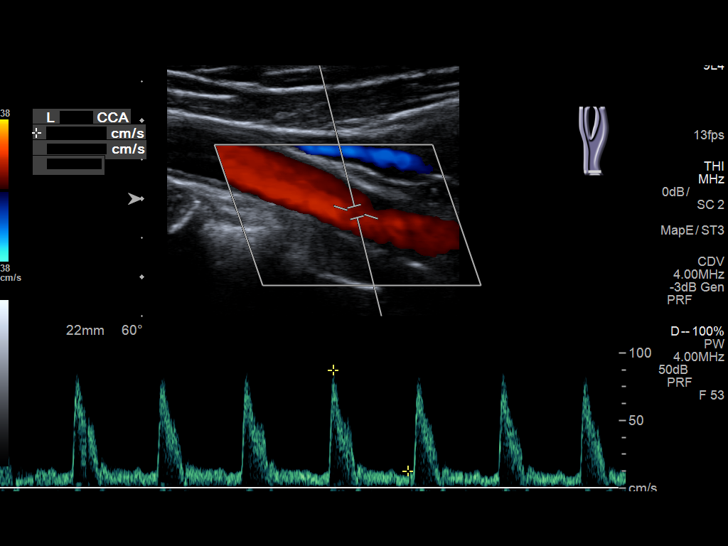
[im 44/68]
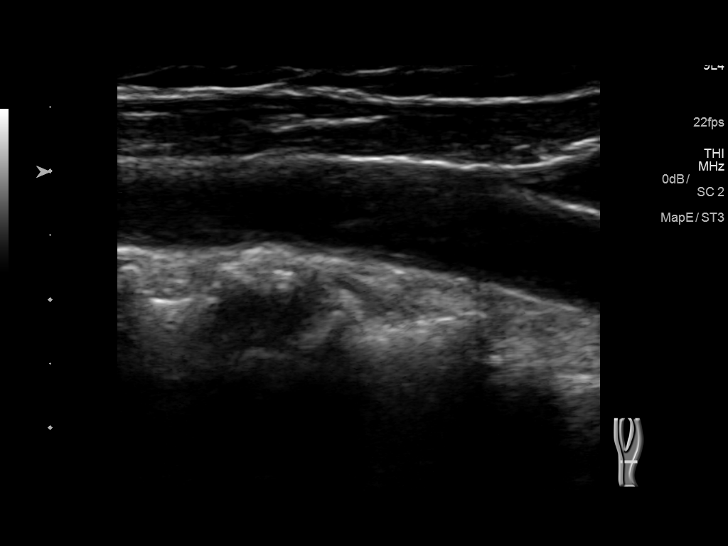
[im 50/68]
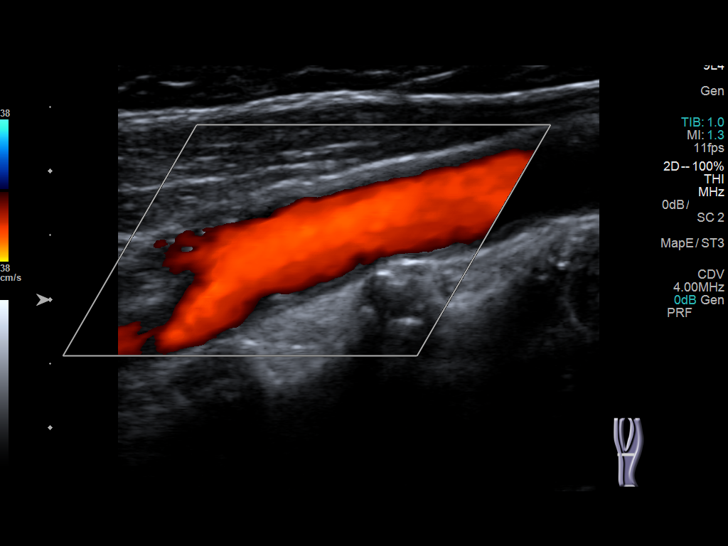
[im 56/68]
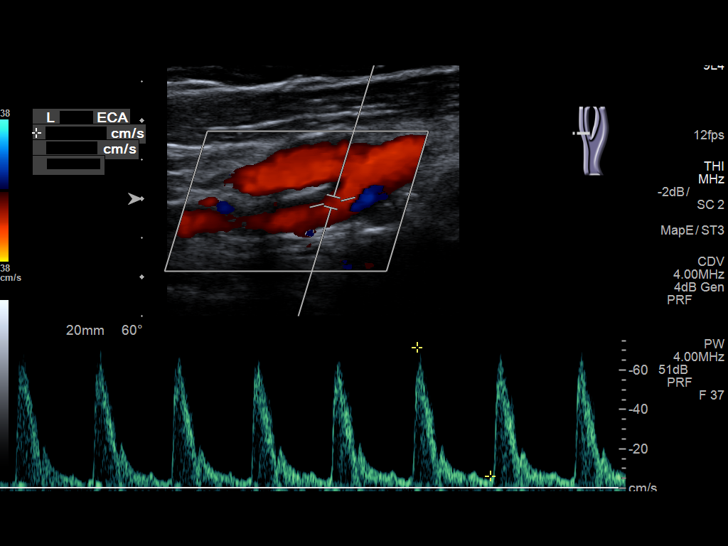
[im 62/68]
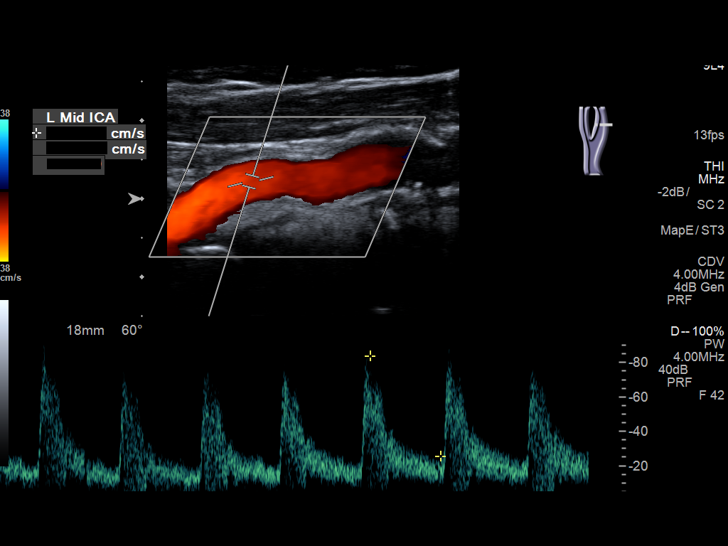
[im 68/68]
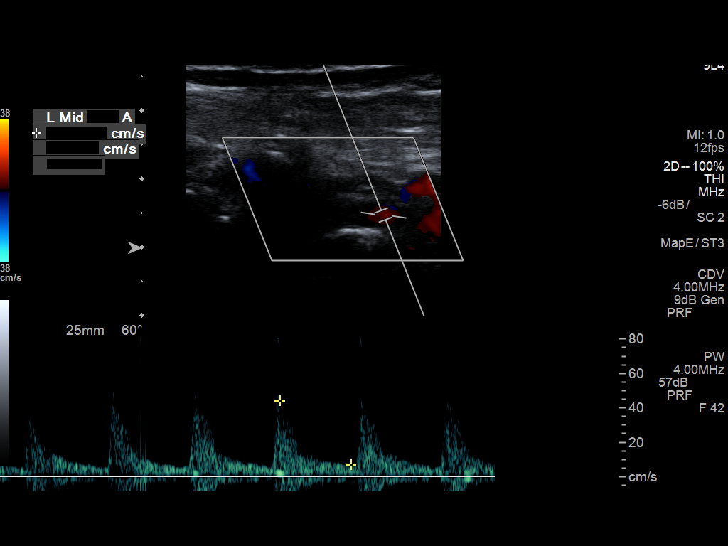

[13 of 24 positions shown; findings below may reference images not displayed]

The following velocity measurements were obtained:

PEAK SYSTOLIC/END DIASTOLIC

RIGHT

ICA:                     113/31cm/sec

CCA:                     135/13cm/sec

SYSTOLIC ICA/CCA RATIO:

DIASTOLIC ICA/CCA RATIO:

ECA:                     58cm/sec

LEFT

ICA:                     107/29cm/sec

CCA:                     87/13cm/sec

SYSTOLIC ICA/CCA RATIO:

DIASTOLIC ICA/CCA RATIO:

ECA:  72 Cm/sec
FINDINGS: RIGHT CAROTID ARTERY: Mild eccentric partially calcified plaque in
the carotid bulb extending to the ICA origin. No high-grade
stenosis. Normal waveforms and color Doppler signal. Distal ICA
tortuous.

RIGHT VERTEBRAL ARTERY:  Normal flow direction and waveform.

LEFT CAROTID ARTERY: Partial calcified plaque in the bulb and
proximal ICA. No high-grade stenosis. Normal waveforms and color
Doppler signal.

LEFT VERTEBRAL ARTERY: Normal flow direction and waveform.
IMPRESSION: 1. Mild bilateral carotid bifurcation and proximal ICA plaque,
resulting in less than 50% diameter stenosis. The exam does not
exclude plaque ulceration or embolization. Continued surveillance
recommended.
2.  Antegrade bilateral vertebral arterial flow.

## 2018-06-27 DIAGNOSIS — Z5181 Encounter for therapeutic drug level monitoring: Secondary | ICD-10-CM | POA: Diagnosis not present

## 2018-06-27 DIAGNOSIS — Z79899 Other long term (current) drug therapy: Secondary | ICD-10-CM | POA: Diagnosis not present

## 2018-07-04 DIAGNOSIS — I5032 Chronic diastolic (congestive) heart failure: Secondary | ICD-10-CM | POA: Diagnosis not present

## 2018-07-18 DIAGNOSIS — L603 Nail dystrophy: Secondary | ICD-10-CM | POA: Diagnosis not present

## 2018-07-18 DIAGNOSIS — R262 Difficulty in walking, not elsewhere classified: Secondary | ICD-10-CM | POA: Diagnosis not present

## 2018-07-18 DIAGNOSIS — I739 Peripheral vascular disease, unspecified: Secondary | ICD-10-CM | POA: Diagnosis not present

## 2018-07-28 ENCOUNTER — Ambulatory Visit (INDEPENDENT_AMBULATORY_CARE_PROVIDER_SITE_OTHER): Payer: Medicare Other | Admitting: *Deleted

## 2018-07-28 DIAGNOSIS — I442 Atrioventricular block, complete: Secondary | ICD-10-CM

## 2018-07-28 DIAGNOSIS — Z950 Presence of cardiac pacemaker: Secondary | ICD-10-CM

## 2018-07-28 LAB — CUP PACEART INCLINIC DEVICE CHECK
Battery Remaining Longevity: 16 mo
Battery Voltage: 2.73 V
Brady Statistic AP VP Percent: 13 %
Brady Statistic AS VP Percent: 87 %
Brady Statistic AS VS Percent: 0 %
Implantable Lead Implant Date: 20000908
Implantable Lead Location: 753860
Implantable Pulse Generator Implant Date: 20090630
Lead Channel Impedance Value: 983 Ohm
Lead Channel Pacing Threshold Amplitude: 0.5 V
Lead Channel Sensing Intrinsic Amplitude: 2.8 mV
Lead Channel Setting Pacing Amplitude: 2 V
Lead Channel Setting Pacing Amplitude: 2.5 V
Lead Channel Setting Pacing Pulse Width: 0.4 ms
MDC IDC LEAD IMPLANT DT: 20000908
MDC IDC LEAD LOCATION: 753859
MDC IDC LEAD SERIAL: 282090
MDC IDC MSMT BATTERY IMPEDANCE: 3567 Ohm
MDC IDC MSMT LEADCHNL RA IMPEDANCE VALUE: 401 Ohm
MDC IDC MSMT LEADCHNL RA PACING THRESHOLD PULSEWIDTH: 0.4 ms
MDC IDC MSMT LEADCHNL RV PACING THRESHOLD AMPLITUDE: 0.75 V
MDC IDC MSMT LEADCHNL RV PACING THRESHOLD PULSEWIDTH: 0.4 ms
MDC IDC SESS DTM: 20190802163044
MDC IDC SET LEADCHNL RV SENSING SENSITIVITY: 2.8 mV
MDC IDC STAT BRADY AP VS PERCENT: 0 %

## 2018-07-28 NOTE — Progress Notes (Signed)
Pacemaker check in clinic. Normal device function. RV threshold, sensing, and impedance consistent with previous measurements. RA sensing and threshold stable, RA lead warning noted from 03/28/18, 263 total low (<200ohms) impedance counts (beats), impedance stable today and weekly impedance trend stable overall, patient asymptomatic. No noise with isometrics noted with bipolar sensing, but reproducible with unipolar sensing. Maintained bipolar pace/sense polarity (Ap ~13%), pending review by JA. Device programmed to maximize longevity. 10 mode switches (<0.1%)--no EGMs due to duration <30sec. No high ventricular rates noted. Device programmed at appropriate safety margins. Histogram distribution appropriate for patient activity level. Device programmed to optimize intrinsic conduction. Estimated longevity 16 months (range <2-30 months). Patient and son decline Carelink monitoring. Patient education completed. ROV with DC/E in 2 months for battery check (N/C), ROV with JA/E on 02/02/2019.

## 2018-08-08 DIAGNOSIS — R296 Repeated falls: Secondary | ICD-10-CM | POA: Diagnosis not present

## 2018-08-09 DIAGNOSIS — R296 Repeated falls: Secondary | ICD-10-CM | POA: Diagnosis not present

## 2018-08-10 DIAGNOSIS — R296 Repeated falls: Secondary | ICD-10-CM | POA: Diagnosis not present

## 2018-08-11 DIAGNOSIS — R296 Repeated falls: Secondary | ICD-10-CM | POA: Diagnosis not present

## 2018-08-14 ENCOUNTER — Other Ambulatory Visit: Payer: Self-pay

## 2018-08-14 ENCOUNTER — Encounter: Payer: Self-pay | Admitting: Neurology

## 2018-08-14 ENCOUNTER — Ambulatory Visit (INDEPENDENT_AMBULATORY_CARE_PROVIDER_SITE_OTHER): Payer: Medicare Other | Admitting: Neurology

## 2018-08-14 VITALS — BP 134/70 | HR 71 | Ht 64.0 in

## 2018-08-14 DIAGNOSIS — G40009 Localization-related (focal) (partial) idiopathic epilepsy and epileptic syndromes with seizures of localized onset, not intractable, without status epilepticus: Secondary | ICD-10-CM | POA: Diagnosis not present

## 2018-08-14 DIAGNOSIS — F03A0 Unspecified dementia, mild, without behavioral disturbance, psychotic disturbance, mood disturbance, and anxiety: Secondary | ICD-10-CM

## 2018-08-14 DIAGNOSIS — F0390 Unspecified dementia without behavioral disturbance: Secondary | ICD-10-CM | POA: Diagnosis not present

## 2018-08-14 NOTE — Progress Notes (Signed)
NEUROLOGY CONSULTATION NOTE  MACKINZE CRIADO MRN: [id number] DOB: 1931-06-24  Referring provider: Dr. Amedeo Kinsman Primary care provider: Dr. Donzetta Sprung  Reason for consult:  Seizures, memory  Dear Dr Dorris Fetch:  Thank you for your kind referral of Cynthia [name] for consultation of the above symptoms. Although her history is well known to you, please allow me to reiterate it for the purpose of our medical record. The patient was accompanied to the clinic by 2 SNF staff, however they are unable to provide much additional history. Most of history is obtained from records from [name] admission in June 2019.  HISTORY OF PRESENT ILLNESS: This is an 84 year old right-handed woman with a history of complete heart block s/p PPM, hyperlipidemia, dementia, presenting for new onset seizure last 05/31/18. She lives at a SNF and was witnessed to have right facial twitching, right facial droop, and right-sided weakness after. She had a head CT with no acute changes seen, repeat head CT the next day did not show any evidence of evolving stroke. She had prolonged EEG for 19 hours which did not show any seizures, there was note of rare generalized sharp waves, intermittent delat slowing on the left, and mild diffuse slowing. She was discharged home on Keppra 500mg  BID. She and staff deny any further seizures since June. Staff denies any staring/unresponsive episodes. She reports occasional headaches on the vertex that resolves with Tylenol. She feels dizzy today, and feels this is due to transport. She denies any diplopia, dysarthria/dysphagia, bowel/bladder dysfunction. She reports left hand numbness and both legs feeling numb. She feels her memory is fine, but she does not recall how long she has been living at the SNF. There are notes from SNF indicating some hallucinations at night. She is on Donepezil 10mg  daily. Per staff, she is able to dress and bathe independently. She  states she does not like where she is living and wants to go back home. There is not much history available regarding her dementia. She denies any prior history of seizures, as far as she knows, there is no family history of seizures, she denies any history of significant head injuries, CNS infections, febrile convulsions.    PAST MEDICAL HISTORY: Past Medical History:  Diagnosis Date  . Arthritis   . Cataracts, bilateral   . Chronic diastolic heart failure (HCC)   . Complete heart block (HCC)    a. s/p Medtronic PPM  . Coronary atherosclerosis of native coronary artery    a. DES to LAD 2/05. b. DES to LAD 02/2013 for ISR.  Marland Kitchen Depression   . GERD (gastroesophageal reflux disease)   . Glaucoma   . Hyperlipidemia   . Hypothyroidism   . Ischemic cardiomyopathy    a. EF 45-50% by cath 03/01/13.  . Sarcoidosis     PAST SURGICAL HISTORY: Past Surgical History:  Procedure Laterality Date  . ANTERIOR CERVICAL DISCECTOMY     Cervical fusion  . cataracts     bilateral  . COLONOSCOPY    . GALLBLADDER SURGERY    . LUMBAR LAMINECTOMY     L3 S1  . PACEMAKER INSERTION  2000, 2007   Medtronic  . PERCUTANEOUS CORONARY STENT INTERVENTION (PCI-S) Left 03/01/13  . PERCUTANEOUS CORONARY STENT INTERVENTION (PCI-S) N/A 03/01/2013   Procedure: PERCUTANEOUS CORONARY STENT INTERVENTION (PCI-S);  Surgeon: Kathleene Hazel, MD;  Location: [facility] CATH LAB;  Service: Cardiovascular;  Laterality: N/A;  . UPPER GASTROINTESTINAL ENDOSCOPY  MEDICATIONS: Current Outpatient Medications on File Prior to Visit  Medication Sig Dispense Refill  . acetaminophen (TYLENOL) 650 MG CR tablet Take 650 mg by mouth at bedtime. *May take one tablet every 6 hours as needed for pain    . amoxicillin (AMOXIL) 500 MG capsule Take 500 mg by mouth 4 (four) times daily as needed.    Marland Kitchen atorvastatin (LIPITOR) 20 MG tablet Take 20 mg by mouth daily.    Marland Kitchen buPROPion (WELLBUTRIN XL) 150 MG 24 hr tablet Take 150 mg by mouth daily.     . calcium-vitamin D (OSCAL 500/200 D-3) 500-200 MG-UNIT tablet Take 1 tablet by mouth 2 (two) times daily.    . Cholecalciferol (VITAMIN D-3) 1000 UNITS CAPS Take 1 capsule by mouth daily.    . clopidogrel (PLAVIX) 75 MG tablet TAKE ONE TABLET BY MOUTH DAILY WITH BREAKFAST 30 tablet 6  . docusate sodium (COLACE) 100 MG capsule Take 200 mg by mouth daily.     Marland Kitchen donepezil (ARICEPT) 10 MG tablet Take 10 mg by mouth at bedtime.    . dorzolamide-timolol (COSOPT) 22.3-6.8 MG/ML ophthalmic solution Place 1 drop into the left eye 2 (two) times daily.     . DULoxetine (CYMBALTA) 60 MG capsule Take 60 mg by mouth daily.    . furosemide (LASIX) 20 MG tablet Take 20 mg by mouth daily.    Marland Kitchen HYDROcodone-acetaminophen (NORCO/VICODIN) 5-325 MG tablet Take 1 tablet by mouth every 8 (eight) hours as needed for moderate pain.    Marland Kitchen levothyroxine (SYNTHROID, LEVOTHROID) 75 MCG tablet Take 75 mcg by mouth daily before breakfast.    . meloxicam (MOBIC) 7.5 MG tablet Take 7.5 mg by mouth daily.    . Multiple Vitamin (MULTIVITAMIN WITH MINERALS) TABS tablet Take 1 tablet by mouth daily.    . mupirocin ointment (BACTROBAN) 2 % Place 1 application into the nose 2 (two) times daily. 22 g 0  . pantoprazole (PROTONIX) 20 MG tablet Take 20 mg by mouth daily.    . potassium chloride SA (K-DUR,KLOR-CON) 20 MEQ tablet Take 20 mEq by mouth daily.    . ranitidine (ZANTAC) 150 MG tablet Take 150 mg by mouth at bedtime.     Marland Kitchen tiZANidine (ZANAFLEX) 2 MG tablet Take 2 mg by mouth every 8 (eight) hours as needed for muscle spasms.     No current facility-administered medications on file prior to visit.     ALLERGIES: Allergies  Allergen Reactions  . Statins     FAMILY HISTORY: Family History  Problem Relation Age of Onset  . Diabetes Son   . Heart attack Mother   . Heart attack Father   . Colon cancer Neg Hx     SOCIAL HISTORY: Social History   Socioeconomic History  . Marital status: Divorced    Spouse name: Not  on file  . Number of children: 3  . Years of education: Not on file  . Highest education level: Not on file  Occupational History  . Occupation: retired    Associate Professor: RETIRED  Social Needs  . Financial resource strain: Not on file  . Food insecurity:    Worry: Not on file    Inability: Not on file  . Transportation needs:    Medical: Not on file    Non-medical: Not on file  Tobacco Use  . Smoking status: Never Smoker  . Smokeless tobacco: Never Used  Substance and Sexual Activity  . Alcohol use: No    Alcohol/week: 0.0 standard drinks  .  Drug use: No  . Sexual activity: Never  Lifestyle  . Physical activity:    Days per week: Not on file    Minutes per session: Not on file  . Stress: Not on file  Relationships  . Social connections:    Talks on phone: Not on file    Gets together: Not on file    Attends religious service: Not on file    Active member of club or organization: Not on file    Attends meetings of clubs or organizations: Not on file    Relationship status: Not on file  . Intimate partner violence:    Fear of current or ex partner: Not on file    Emotionally abused: Not on file    Physically abused: Not on file    Forced sexual activity: Not on file  Other Topics Concern  . Not on file  Social History Narrative  . Not on file    REVIEW OF SYSTEMS: Constitutional: No fevers, chills, or sweats, no generalized fatigue, change in appetite Eyes: No visual changes, double vision, eye pain Ear, nose and throat: No hearing loss, ear pain, nasal congestion, sore throat Cardiovascular: No chest pain, palpitations Respiratory:  No shortness of breath at rest or with exertion, wheezes GastrointestinaI: No nausea, vomiting, diarrhea, abdominal pain, fecal incontinence Genitourinary:  No dysuria, urinary retention or frequency Musculoskeletal:  No neck pain,+ back pain Integumentary: No rash, pruritus, skin lesions Neurological: as above Psychiatric: No  depression, insomnia, anxiety Endocrine: No palpitations, fatigue, diaphoresis, mood swings, change in appetite, change in weight, increased thirst Hematologic/Lymphatic:  No anemia, purpura, petechiae. Allergic/Immunologic: no itchy/runny eyes, nasal congestion, recent allergic reactions, rashes  PHYSICAL EXAM: Vitals:   08/14/18 1047  BP: 134/70  Pulse: 71  SpO2: 95%   General: No acute distress Head:  Normocephalic/atraumatic Eyes: Fundoscopic exam shows bilateral sharp discs, no vessel changes, exudates, or hemorrhages Neck: supple, no paraspinal tenderness, full range of motion Back: No paraspinal tenderness Heart: regular rate and rhythm Lungs: Clear to auscultation bilaterally. Vascular: No carotid bruits. Skin/Extremities: No rash, no edema Neurological Exam: Mental status: alert and oriented to person, place, and time, no dysarthria or aphasia, Fund of knowledge is reduced.  Recent and remote memory are impaired.  Attention and concentration are normal, able to spell WORLD but refuses/unable to spell backward. Able to name objects and repeat phrases. CDT 5/5 MMSE - Mini Mental State Exam 08/14/2018  Orientation to time 4  Orientation to Place 5  Registration 3  Attention/ Calculation 0  Recall 0  Language- name 2 objects 2  Language- repeat 1  Language- follow 3 step command 2  Language- read & follow direction 1  Write a sentence 1  Copy design 1  Total score 20   Cranial nerves: CN I: not tested CN II: pupils asymmetric, irregular on left, reactive to light on right, visual fields intact, fundi unremarkable. CN III, IV, VI:  full range of motion, no nystagmus, no ptosis CN V: decreased cold and pin on right V2 CN VII: upper and lower face symmetric CN VIII: hearing intact to finger rub CN IX, X: gag intact, uvula midline CN XI: sternocleidomastoid and trapezius muscles intact CN XII: tongue midline Bulk & Tone: normal, no fasciculations. Motor: 5/5 throughout  with no pronator drift. Sensation: decreased cold on and pin on right UE, left LE intact vibration and joint position sense.  Deep Tendon Reflexes: +2 throughout, no ankle clonus Plantar responses: downgoing bilaterally Cerebellar: no  incoordination on finger to nose testing Gait: not tested, sitting on wheelchair Tremor: none  IMPRESSION: This is an 84 year old right-handed woman with a history of complete heart block s/p PPM, hyperlipidemia, CAD, dementia with behavioral disturbance, presenting for new onset focal seizure last 05/31/18. She was witnessed to have right facial twitching followed by right-sided Todd's paralysis. No focal weakness noted today. Head CT no acute changes, unable to do MRI brain due to pacemaker. Her EEG had shown generalized epileptiform discharges and focal left-sided slowing. She is now on Keppra 500mg  BID with no report of seizures since June 2019, no side effects. She has mild to moderate dementia with behavioral disturbance, continue Donepezil 10mg  daily. Follow-up in 6 months, staff knows to call for any changes.   Thank you for allowing me to participate in the care of this patient. Please do not hesitate to call for any questions or concerns.   Patrcia DollyKaren Aquino, M.D.  CC: Dr. Dorris Fetchedmond, Dr. Reuel Boomaniel

## 2018-08-14 NOTE — Patient Instructions (Signed)
1. Continue Keppra 500mg  twice a day 2. Continue Donepezil 10mg  daily 3. Follow-up in 6 months or so, call for any changes  Seizure Precautions: 1. If medication has been prescribed for you to prevent seizures, take it exactly as directed.  Do not stop taking the medicine without talking to your doctor first, even if you have not had a seizure in a long time.   2. Avoid activities in which a seizure would cause danger to yourself or to others.  Don't operate dangerous machinery, swim alone, or climb in high or dangerous places, such as on ladders, roofs, or girders.  Do not drive unless your doctor says you may.  3. If you have any warning that you may have a seizure, lay down in a safe place where you can't hurt yourself.    4.  No driving for 6 months from last seizure, [place].   Please refer to the following link on the Epile[employer] of [employer]'s website for more information: http://www.epilepsyfoundation.org/answerplace/Social/driving/drivingu.cfm   5.  Maintain good sleep hygiene. Avoid alcohol.  6.  Contact your doctor if you have any problems that may be related to the medicine you are taking.  7.  Call 911 and bring the patient back to the ED if:        A.  The seizure lasts longer than 5 minutes.       B.  The patient doesn't awaken shortly after the seizure  C.  The patient has new problems such as difficulty seeing, speaking or moving  D.  The patient was injured during the seizure  E.  The patient has a temperature over 102 F (39C)  F.  The patient vomited and now is having trouble breathing

## 2018-08-15 DIAGNOSIS — R296 Repeated falls: Secondary | ICD-10-CM | POA: Diagnosis not present

## 2018-08-16 DIAGNOSIS — R296 Repeated falls: Secondary | ICD-10-CM | POA: Diagnosis not present

## 2018-08-17 DIAGNOSIS — R296 Repeated falls: Secondary | ICD-10-CM | POA: Diagnosis not present

## 2018-08-18 DIAGNOSIS — R296 Repeated falls: Secondary | ICD-10-CM | POA: Diagnosis not present

## 2018-08-23 DIAGNOSIS — H26493 Other secondary cataract, bilateral: Secondary | ICD-10-CM | POA: Diagnosis not present

## 2018-08-23 DIAGNOSIS — H401134 Primary open-angle glaucoma, bilateral, indeterminate stage: Secondary | ICD-10-CM | POA: Diagnosis not present

## 2018-08-23 DIAGNOSIS — H524 Presbyopia: Secondary | ICD-10-CM | POA: Diagnosis not present

## 2018-08-23 DIAGNOSIS — Z961 Presence of intraocular lens: Secondary | ICD-10-CM | POA: Diagnosis not present

## 2018-08-25 DIAGNOSIS — H5213 Myopia, bilateral: Secondary | ICD-10-CM | POA: Diagnosis not present

## 2018-08-29 DIAGNOSIS — I5032 Chronic diastolic (congestive) heart failure: Secondary | ICD-10-CM | POA: Diagnosis not present

## 2018-09-26 DIAGNOSIS — Z23 Encounter for immunization: Secondary | ICD-10-CM | POA: Diagnosis not present

## 2018-09-29 ENCOUNTER — Ambulatory Visit (INDEPENDENT_AMBULATORY_CARE_PROVIDER_SITE_OTHER): Payer: Medicare Other | Admitting: *Deleted

## 2018-09-29 DIAGNOSIS — Z950 Presence of cardiac pacemaker: Secondary | ICD-10-CM

## 2018-09-29 DIAGNOSIS — I442 Atrioventricular block, complete: Secondary | ICD-10-CM

## 2018-09-29 LAB — CUP PACEART INCLINIC DEVICE CHECK
Battery Impedance: 3827 Ohm
Brady Statistic AP VS Percent: 0 %
Brady Statistic AS VP Percent: 92 %
Brady Statistic AS VS Percent: 1 %
Implantable Lead Implant Date: 20000908
Implantable Lead Implant Date: 20000908
Implantable Lead Location: 753859
Implantable Lead Model: 4285
Implantable Lead Serial Number: 282090
Lead Channel Impedance Value: 1042 Ohm
Lead Channel Pacing Threshold Amplitude: 0.625 V
Lead Channel Pacing Threshold Amplitude: 0.75 V
Lead Channel Pacing Threshold Pulse Width: 0.4 ms
Lead Channel Pacing Threshold Pulse Width: 0.4 ms
Lead Channel Setting Pacing Amplitude: 2 V
Lead Channel Setting Pacing Amplitude: 2.5 V
MDC IDC LEAD LOCATION: 753860
MDC IDC MSMT BATTERY REMAINING LONGEVITY: 15 mo
MDC IDC MSMT BATTERY VOLTAGE: 2.73 V
MDC IDC MSMT LEADCHNL RA IMPEDANCE VALUE: 415 Ohm
MDC IDC PG IMPLANT DT: 20090630
MDC IDC SESS DTM: 20191004143257
MDC IDC SET LEADCHNL RV PACING PULSEWIDTH: 0.4 ms
MDC IDC SET LEADCHNL RV SENSING SENSITIVITY: 2.8 mV
MDC IDC STAT BRADY AP VP PERCENT: 7 %

## 2018-09-29 NOTE — Progress Notes (Signed)
Pacemaker battery check in clinic. Estimated remaining longevity 15 months (<1-28 months). No testing performed. 4 mode switches (<0.1%)--no EGMs due to short duration. No high ventricular rates noted. ROV with JA/E on 01/26/19 as scheduled.

## 2018-10-24 DIAGNOSIS — I5032 Chronic diastolic (congestive) heart failure: Secondary | ICD-10-CM | POA: Diagnosis not present

## 2018-12-22 DIAGNOSIS — I5032 Chronic diastolic (congestive) heart failure: Secondary | ICD-10-CM | POA: Diagnosis not present

## 2019-01-26 ENCOUNTER — Ambulatory Visit (INDEPENDENT_AMBULATORY_CARE_PROVIDER_SITE_OTHER): Payer: Medicare Other | Admitting: Internal Medicine

## 2019-01-26 ENCOUNTER — Encounter: Payer: Self-pay | Admitting: Internal Medicine

## 2019-01-26 VITALS — BP 132/84 | HR 77 | Ht 65.0 in | Wt 148.4 lb

## 2019-01-26 DIAGNOSIS — I442 Atrioventricular block, complete: Secondary | ICD-10-CM | POA: Diagnosis not present

## 2019-01-26 DIAGNOSIS — I2510 Atherosclerotic heart disease of native coronary artery without angina pectoris: Secondary | ICD-10-CM

## 2019-01-26 DIAGNOSIS — Z950 Presence of cardiac pacemaker: Secondary | ICD-10-CM

## 2019-01-26 NOTE — Progress Notes (Signed)
PCP: Richardean Chimera, MD Primary Cardiologist: Dr Diona Browner Primary EP:  Dr Johney Frame  Cynthia [name] is a 85 y.o. female who presents today for routine electrophysiology followup.  She has dementia. She states "I am not well".  "I just want to go to sleep and never wake up".  Today, she denies symptoms of palpitations, chest pain, shortness of breath, dizziness, presyncope, or syncope.  The patient is otherwise without complaint today.   Past Medical History:  Diagnosis Date  . Arthritis   . Cataracts, bilateral   . Chronic diastolic heart failure (HCC)   . Complete heart block (HCC)    a. s/p Medtronic PPM  . Coronary atherosclerosis of native coronary artery    a. DES to LAD 2/05. b. DES to LAD 02/2013 for ISR.  Marland Kitchen Depression   . GERD (gastroesophageal reflux disease)   . Glaucoma   . Hyperlipidemia   . Hypothyroidism   . Ischemic cardiomyopathy    a. EF 45-50% by cath 03/01/13.  . Sarcoidosis    Past Surgical History:  Procedure Laterality Date  . ANTERIOR CERVICAL DISCECTOMY     Cervical fusion  . cataracts     bilateral  . COLONOSCOPY    . GALLBLADDER SURGERY    . LUMBAR LAMINECTOMY     L3 S1  . PACEMAKER INSERTION  2000, 2007   Medtronic  . PERCUTANEOUS CORONARY STENT INTERVENTION (PCI-S) Left 03/01/13  . PERCUTANEOUS CORONARY STENT INTERVENTION (PCI-S) N/A 03/01/2013   Procedure: PERCUTANEOUS CORONARY STENT INTERVENTION (PCI-S);  Surgeon: Kathleene Hazel, MD;  Location: [facility] CATH LAB;  Service: Cardiovascular;  Laterality: N/A;  . UPPER GASTROINTESTINAL ENDOSCOPY      ROS- all systems are reviewed and negative except as per HPI above  Current Outpatient Medications  Medication Sig Dispense Refill  . acetaminophen (TYLENOL) 650 MG CR tablet Take 650 mg by mouth every 8 (eight) hours as needed for pain.    Marland Kitchen atorvastatin (LIPITOR) 20 MG tablet Take 20 mg by mouth daily.    . clopidogrel (PLAVIX) 75 MG tablet TAKE ONE TABLET BY MOUTH DAILY WITH BREAKFAST 30 tablet  6  . diphenhydrAMINE (BENADRYL) 25 MG tablet Take 25 mg by mouth as needed for itching.    . docusate sodium (COLACE) 100 MG capsule Take 100 mg by mouth daily.     Marland Kitchen donepezil (ARICEPT) 10 MG tablet Take 10 mg by mouth at bedtime.    . DULoxetine (CYMBALTA) 60 MG capsule Take 60 mg by mouth daily.    . furosemide (LASIX) 20 MG tablet Take 20 mg by mouth daily.    Marland Kitchen HYDROcodone-acetaminophen (NORCO/VICODIN) 5-325 MG tablet Take 1 tablet by mouth every 6 (six) hours as needed for moderate pain.    . hydrOXYzine (ATARAX/VISTARIL) 10 MG tablet Take 10 mg by mouth every 6 (six) hours as needed.     . levETIRAcetam (KEPPRA) 500 MG tablet Take 500 mg by mouth 2 (two) times daily.    Marland Kitchen levothyroxine (SYNTHROID, LEVOTHROID) 88 MCG tablet Take 1 tablet by mouth daily.    . potassium chloride SA (K-DUR,KLOR-CON) 20 MEQ tablet Take 1 tablet by mouth daily.    . ranitidine (ZANTAC) 150 MG tablet Take 150 mg by mouth daily.    . timolol (TIMOPTIC) 0.25 % ophthalmic solution Place 1 drop into the left eye daily.     No current facility-administered medications for this visit.     Physical Exam: Vitals:   01/26/19 1039  BP:  132/84  Pulse: 77  SpO2: 97%  Weight: 148 lb 6.4 oz (67.3 kg)  Height: 5\' 5"  (1.651 m)    GEN- The patient is elderly appearing, [name] but confused Head- normocephalic, atraumatic Eyes-  Sclera clear, conjunctiva pink Ears- hearing intact Oropharynx- clear Lungs- Clear to ausculation bilaterally, normal work of breathing Chest- pacemaker pocket is well healed Heart- Regular rate and rhythm, no murmurs, rubs or gallops, PMI not laterally displaced GI- soft, NT, ND, + BS Extremities- no clubbing, cyanosis, or edema  Pacemaker interrogation- reviewed in detail today,  See PACEART report   Assessment and Plan:  1. Symptomatic complete heart block Normal pacemaker function See Pace Art report No changes today She has chronic RA lead noise for which we do not plan to  revise.  She has also noticed some reduction in impedance of the RA lead.  2. CAD No ischemic symptoms  Approaching ERI She has declined remote monitoring Return in 3 months for battery check  Hillis Range MD, [facility] 01/26/2019 11:10 AM

## 2019-01-26 NOTE — Patient Instructions (Signed)
Medication Instructions:  Continue all current medications.  Labwork: none  Testing/Procedures: none  Follow-Up: 3 months   Any Other Special Instructions Will Be Listed Below (If Applicable).  If you need a refill on your cardiac medications before your next appointment, please call your pharmacy.  

## 2019-02-02 ENCOUNTER — Encounter: Payer: Medicare Other | Admitting: Internal Medicine

## 2019-02-16 DIAGNOSIS — I5032 Chronic diastolic (congestive) heart failure: Secondary | ICD-10-CM | POA: Diagnosis not present

## 2019-03-05 DIAGNOSIS — H26493 Other secondary cataract, bilateral: Secondary | ICD-10-CM | POA: Diagnosis not present

## 2019-03-05 DIAGNOSIS — H401134 Primary open-angle glaucoma, bilateral, indeterminate stage: Secondary | ICD-10-CM | POA: Diagnosis not present

## 2019-03-05 DIAGNOSIS — Z961 Presence of intraocular lens: Secondary | ICD-10-CM | POA: Diagnosis not present

## 2019-03-14 LAB — CUP PACEART INCLINIC DEVICE CHECK
Battery Impedance: 4688 Ohm
Battery Voltage: 2.7 V
Brady Statistic AP VS Percent: 0 %
Brady Statistic AS VP Percent: 91 %
Date Time Interrogation Session: 20200131174649
Implantable Lead Implant Date: 20000908
Implantable Lead Model: 4285
Lead Channel Impedance Value: 415 Ohm
Lead Channel Pacing Threshold Amplitude: 1 V
Lead Channel Pacing Threshold Pulse Width: 0.4 ms
Lead Channel Pacing Threshold Pulse Width: 0.4 ms
Lead Channel Sensing Intrinsic Amplitude: 1.4 mV
Lead Channel Setting Pacing Amplitude: 2.5 V
MDC IDC LEAD IMPLANT DT: 20000908
MDC IDC LEAD LOCATION: 753859
MDC IDC LEAD LOCATION: 753860
MDC IDC LEAD SERIAL: 282090
MDC IDC MSMT BATTERY REMAINING LONGEVITY: 9 mo
MDC IDC MSMT LEADCHNL RV IMPEDANCE VALUE: 1018 Ohm
MDC IDC MSMT LEADCHNL RV PACING THRESHOLD AMPLITUDE: 0.75 V
MDC IDC PG IMPLANT DT: 20090630
MDC IDC SET LEADCHNL RA PACING AMPLITUDE: 2 V
MDC IDC SET LEADCHNL RV PACING PULSEWIDTH: 0.4 ms
MDC IDC SET LEADCHNL RV SENSING SENSITIVITY: 2.8 mV
MDC IDC STAT BRADY AP VP PERCENT: 9 %
MDC IDC STAT BRADY AS VS PERCENT: 0 %

## 2019-03-16 ENCOUNTER — Telehealth: Payer: Self-pay

## 2019-03-16 NOTE — Telephone Encounter (Signed)
Called both number listed for pt.  [phone] no answer.  VM box full.    [phone] disconnected.

## 2019-03-21 ENCOUNTER — Ambulatory Visit: Payer: Medicare Other | Admitting: Neurology

## 2019-04-27 ENCOUNTER — Encounter: Payer: Medicaid Other | Admitting: Internal Medicine

## 2019-10-31 ENCOUNTER — Ambulatory Visit: Payer: Medicare Other | Admitting: Neurology

## 2020-08-27 DEATH — deceased
